# Patient Record
Sex: Female | Born: 1945 | Race: Black or African American | Hispanic: No | Smoking: Former smoker
Health system: Southern US, Community
[De-identification: ages and names within clinical notes are randomized; demographics above are authoritative.]

## PROBLEM LIST (undated history)

## (undated) DIAGNOSIS — E870 Hyperosmolality and hypernatremia: Secondary | ICD-10-CM

## (undated) DIAGNOSIS — I10 Essential (primary) hypertension: Secondary | ICD-10-CM

## (undated) DIAGNOSIS — D649 Anemia, unspecified: Secondary | ICD-10-CM

## (undated) DIAGNOSIS — I441 Atrioventricular block, second degree: Secondary | ICD-10-CM

## (undated) DIAGNOSIS — E876 Hypokalemia: Secondary | ICD-10-CM

## (undated) DIAGNOSIS — E785 Hyperlipidemia, unspecified: Secondary | ICD-10-CM

## (undated) DIAGNOSIS — I639 Cerebral infarction, unspecified: Secondary | ICD-10-CM

## (undated) DIAGNOSIS — M1990 Unspecified osteoarthritis, unspecified site: Secondary | ICD-10-CM

## (undated) DIAGNOSIS — J449 Chronic obstructive pulmonary disease, unspecified: Secondary | ICD-10-CM

## (undated) DIAGNOSIS — I6529 Occlusion and stenosis of unspecified carotid artery: Secondary | ICD-10-CM

## (undated) DIAGNOSIS — I2510 Atherosclerotic heart disease of native coronary artery without angina pectoris: Secondary | ICD-10-CM

## (undated) DIAGNOSIS — IMO0002 Reserved for concepts with insufficient information to code with codable children: Secondary | ICD-10-CM

## (undated) DIAGNOSIS — I442 Atrioventricular block, complete: Secondary | ICD-10-CM

## (undated) DIAGNOSIS — Z950 Presence of cardiac pacemaker: Secondary | ICD-10-CM

## (undated) HISTORY — DX: Atrioventricular block, complete: I44.2

## (undated) HISTORY — DX: Atrioventricular block, second degree: I44.1

## (undated) HISTORY — DX: Atherosclerotic heart disease of native coronary artery without angina pectoris: I25.10

## (undated) HISTORY — DX: Reserved for concepts with insufficient information to code with codable children: IMO0002

## (undated) HISTORY — DX: Presence of cardiac pacemaker: Z95.0

## (undated) HISTORY — DX: Hypokalemia: E87.6

## (undated) HISTORY — DX: Hyperosmolality and hypernatremia: E87.0

## (undated) HISTORY — DX: Hyperlipidemia, unspecified: E78.5

## (undated) HISTORY — DX: Anemia, unspecified: D64.9

## (undated) HISTORY — PX: ABOVE KNEE LEG AMPUTATION: SUR20

## (undated) HISTORY — DX: Unspecified osteoarthritis, unspecified site: M19.90

## (undated) HISTORY — PX: CAROTID ENDARTERECTOMY: SUR193

## (undated) HISTORY — DX: Occlusion and stenosis of unspecified carotid artery: I65.29

---

## 2001-01-01 DIAGNOSIS — R32 Unspecified urinary incontinence: Secondary | ICD-10-CM | POA: Insufficient documentation

## 2001-01-08 ENCOUNTER — Ambulatory Visit (HOSPITAL_COMMUNITY): Admission: RE | Admit: 2001-01-08 | Discharge: 2001-01-08 | Payer: Self-pay | Admitting: Family Medicine

## 2001-02-21 ENCOUNTER — Encounter: Payer: Self-pay | Admitting: Family Medicine

## 2001-02-21 ENCOUNTER — Ambulatory Visit (HOSPITAL_COMMUNITY): Admission: RE | Admit: 2001-02-21 | Discharge: 2001-02-21 | Payer: Self-pay | Admitting: Family Medicine

## 2001-04-10 ENCOUNTER — Inpatient Hospital Stay (HOSPITAL_COMMUNITY): Admission: EM | Admit: 2001-04-10 | Discharge: 2001-04-12 | Payer: Self-pay | Admitting: Emergency Medicine

## 2001-04-10 ENCOUNTER — Encounter: Payer: Self-pay | Admitting: *Deleted

## 2001-06-02 ENCOUNTER — Encounter: Payer: Self-pay | Admitting: Vascular Surgery

## 2001-06-02 ENCOUNTER — Encounter: Admission: RE | Admit: 2001-06-02 | Discharge: 2001-06-02 | Payer: Self-pay | Admitting: Vascular Surgery

## 2001-06-04 ENCOUNTER — Ambulatory Visit: Admission: RE | Admit: 2001-06-04 | Discharge: 2001-06-04 | Payer: Self-pay | Admitting: Vascular Surgery

## 2001-06-04 ENCOUNTER — Encounter: Payer: Self-pay | Admitting: Vascular Surgery

## 2001-06-16 ENCOUNTER — Inpatient Hospital Stay (HOSPITAL_COMMUNITY): Admission: RE | Admit: 2001-06-16 | Discharge: 2001-06-17 | Payer: Self-pay | Admitting: Vascular Surgery

## 2001-06-16 ENCOUNTER — Encounter (INDEPENDENT_AMBULATORY_CARE_PROVIDER_SITE_OTHER): Payer: Self-pay | Admitting: *Deleted

## 2001-06-26 DIAGNOSIS — I6529 Occlusion and stenosis of unspecified carotid artery: Secondary | ICD-10-CM

## 2002-02-09 ENCOUNTER — Ambulatory Visit (HOSPITAL_BASED_OUTPATIENT_CLINIC_OR_DEPARTMENT_OTHER): Admission: RE | Admit: 2002-02-09 | Discharge: 2002-02-09 | Payer: Self-pay | Admitting: Cardiology

## 2002-03-16 ENCOUNTER — Ambulatory Visit (HOSPITAL_COMMUNITY): Admission: RE | Admit: 2002-03-16 | Discharge: 2002-03-17 | Payer: Self-pay | Admitting: Cardiology

## 2002-03-16 HISTORY — PX: PERIPHERAL ARTERIAL STENT GRAFT: SHX2220

## 2002-04-10 HISTORY — PX: CARDIAC CATHETERIZATION: SHX172

## 2003-06-14 ENCOUNTER — Emergency Department (HOSPITAL_COMMUNITY): Admission: EM | Admit: 2003-06-14 | Discharge: 2003-06-14 | Payer: Self-pay | Admitting: Emergency Medicine

## 2005-01-01 ENCOUNTER — Ambulatory Visit: Payer: Self-pay | Admitting: Family Medicine

## 2005-01-09 ENCOUNTER — Ambulatory Visit: Payer: Self-pay | Admitting: Family Medicine

## 2005-01-23 ENCOUNTER — Ambulatory Visit: Payer: Self-pay | Admitting: Family Medicine

## 2005-02-13 ENCOUNTER — Ambulatory Visit: Payer: Self-pay | Admitting: Family Medicine

## 2005-06-27 ENCOUNTER — Ambulatory Visit: Payer: Self-pay | Admitting: Family Medicine

## 2005-08-23 ENCOUNTER — Ambulatory Visit: Payer: Self-pay | Admitting: Family Medicine

## 2006-01-31 ENCOUNTER — Encounter (INDEPENDENT_AMBULATORY_CARE_PROVIDER_SITE_OTHER): Payer: Self-pay | Admitting: Family Medicine

## 2006-01-31 ENCOUNTER — Ambulatory Visit: Payer: Self-pay | Admitting: Family Medicine

## 2006-05-07 ENCOUNTER — Ambulatory Visit: Payer: Self-pay | Admitting: Family Medicine

## 2006-09-10 ENCOUNTER — Ambulatory Visit: Payer: Self-pay | Admitting: Family Medicine

## 2006-09-19 ENCOUNTER — Ambulatory Visit (HOSPITAL_COMMUNITY): Admission: RE | Admit: 2006-09-19 | Discharge: 2006-09-19 | Payer: Self-pay | Admitting: Family Medicine

## 2006-10-08 ENCOUNTER — Encounter (INDEPENDENT_AMBULATORY_CARE_PROVIDER_SITE_OTHER): Payer: Self-pay | Admitting: Family Medicine

## 2006-10-08 DIAGNOSIS — E119 Type 2 diabetes mellitus without complications: Secondary | ICD-10-CM

## 2006-10-08 DIAGNOSIS — E1139 Type 2 diabetes mellitus with other diabetic ophthalmic complication: Secondary | ICD-10-CM

## 2006-10-08 DIAGNOSIS — E669 Obesity, unspecified: Secondary | ICD-10-CM | POA: Insufficient documentation

## 2006-10-08 DIAGNOSIS — Z8679 Personal history of other diseases of the circulatory system: Secondary | ICD-10-CM | POA: Insufficient documentation

## 2006-10-08 DIAGNOSIS — H409 Unspecified glaucoma: Secondary | ICD-10-CM | POA: Insufficient documentation

## 2006-10-08 DIAGNOSIS — E1129 Type 2 diabetes mellitus with other diabetic kidney complication: Secondary | ICD-10-CM

## 2006-10-08 DIAGNOSIS — F17200 Nicotine dependence, unspecified, uncomplicated: Secondary | ICD-10-CM | POA: Insufficient documentation

## 2006-10-08 DIAGNOSIS — E785 Hyperlipidemia, unspecified: Secondary | ICD-10-CM

## 2006-10-08 DIAGNOSIS — I10 Essential (primary) hypertension: Secondary | ICD-10-CM | POA: Insufficient documentation

## 2006-10-08 DIAGNOSIS — I739 Peripheral vascular disease, unspecified: Secondary | ICD-10-CM | POA: Insufficient documentation

## 2006-11-28 ENCOUNTER — Ambulatory Visit: Payer: Self-pay | Admitting: Family Medicine

## 2006-11-28 LAB — CONVERTED CEMR LAB
Albumin: 4.2 g/dL (ref 3.5–5.2)
CO2: 23 meq/L (ref 19–32)
Calcium: 8.5 mg/dL (ref 8.4–10.5)
Chloride: 105 meq/L (ref 96–112)
Cholesterol: 158 mg/dL (ref 0–200)
Glucose, Bld: 90 mg/dL (ref 70–99)
Potassium: 3.8 meq/L (ref 3.5–5.3)
Sodium: 142 meq/L (ref 135–145)
Total Bilirubin: 0.4 mg/dL (ref 0.3–1.2)
Total Protein: 7 g/dL (ref 6.0–8.3)
Triglycerides: 105 mg/dL (ref <150)

## 2007-01-21 ENCOUNTER — Telehealth (INDEPENDENT_AMBULATORY_CARE_PROVIDER_SITE_OTHER): Payer: Self-pay | Admitting: *Deleted

## 2007-01-24 ENCOUNTER — Telehealth (INDEPENDENT_AMBULATORY_CARE_PROVIDER_SITE_OTHER): Payer: Self-pay | Admitting: Family Medicine

## 2007-02-11 ENCOUNTER — Ambulatory Visit: Payer: Self-pay | Admitting: Family Medicine

## 2007-02-11 DIAGNOSIS — G8929 Other chronic pain: Secondary | ICD-10-CM

## 2007-03-18 ENCOUNTER — Telehealth (INDEPENDENT_AMBULATORY_CARE_PROVIDER_SITE_OTHER): Payer: Self-pay | Admitting: Family Medicine

## 2007-04-23 ENCOUNTER — Telehealth (INDEPENDENT_AMBULATORY_CARE_PROVIDER_SITE_OTHER): Payer: Self-pay | Admitting: Family Medicine

## 2007-07-01 ENCOUNTER — Ambulatory Visit: Payer: Self-pay | Admitting: Family Medicine

## 2007-07-01 LAB — CONVERTED CEMR LAB
Albumin: 3.8 g/dL (ref 3.5–5.2)
Alkaline Phosphatase: 99 units/L (ref 39–117)
BUN: 16 mg/dL (ref 6–23)
CO2: 23 meq/L (ref 19–32)
Cholesterol: 139 mg/dL (ref 0–200)
Eosinophils Absolute: 0.1 10*3/uL (ref 0.0–0.7)
Eosinophils Relative: 1 % (ref 0–5)
Glucose, Bld: 102 mg/dL — ABNORMAL HIGH (ref 70–99)
HCT: 38.6 % (ref 36.0–46.0)
HDL: 37 mg/dL — ABNORMAL LOW (ref >39)
Hgb A1c MFr Bld: 6.4 %
LDL Cholesterol: 82 mg/dL (ref 0–99)
Lymphocytes Relative: 38 % (ref 12–46)
Lymphs Abs: 3 10*3/uL (ref 0.7–4.0)
MCV: 94.6 fL (ref 78.0–100.0)
Monocytes Relative: 6 % (ref 3–12)
Potassium: 3.9 meq/L (ref 3.5–5.3)
RBC: 4.08 M/uL (ref 3.87–5.11)
Total Bilirubin: 0.3 mg/dL (ref 0.3–1.2)
Total Protein: 6.6 g/dL (ref 6.0–8.3)
Triglycerides: 99 mg/dL (ref <150)
WBC: 7.9 10*3/uL (ref 4.0–10.5)

## 2007-10-06 ENCOUNTER — Telehealth (INDEPENDENT_AMBULATORY_CARE_PROVIDER_SITE_OTHER): Payer: Self-pay | Admitting: Family Medicine

## 2007-11-24 ENCOUNTER — Telehealth (INDEPENDENT_AMBULATORY_CARE_PROVIDER_SITE_OTHER): Payer: Self-pay | Admitting: Family Medicine

## 2007-12-29 ENCOUNTER — Ambulatory Visit: Payer: Self-pay | Admitting: Family Medicine

## 2007-12-29 LAB — CONVERTED CEMR LAB
Alkaline Phosphatase: 122 units/L — ABNORMAL HIGH (ref 39–117)
BUN: 11 mg/dL (ref 6–23)
CO2: 23 meq/L (ref 19–32)
Cholesterol: 160 mg/dL (ref 0–200)
Creatinine, Ser: 0.8 mg/dL (ref 0.40–1.20)
Eosinophils Absolute: 0.1 10*3/uL (ref 0.0–0.7)
Eosinophils Relative: 1 % (ref 0–5)
Glucose, Bld: 96 mg/dL (ref 70–99)
HCT: 42.2 % (ref 36.0–46.0)
HDL: 43 mg/dL (ref >39)
Hemoglobin: 13.2 g/dL (ref 12.0–15.0)
Hgb A1c MFr Bld: 6.3 %
LDL Cholesterol: 92 mg/dL (ref 0–99)
Lymphocytes Relative: 31 % (ref 12–46)
Lymphs Abs: 2.8 10*3/uL (ref 0.7–4.0)
MCV: 96.1 fL (ref 78.0–100.0)
Monocytes Absolute: 0.6 10*3/uL (ref 0.1–1.0)
Monocytes Relative: 7 % (ref 3–12)
Platelets: 295 10*3/uL (ref 150–400)
Total Bilirubin: 0.6 mg/dL (ref 0.3–1.2)
Total Protein: 6.9 g/dL (ref 6.0–8.3)
Triglycerides: 125 mg/dL (ref <150)
VLDL: 25 mg/dL (ref 0–40)
WBC: 9 10*3/uL (ref 4.0–10.5)

## 2008-02-16 ENCOUNTER — Telehealth (INDEPENDENT_AMBULATORY_CARE_PROVIDER_SITE_OTHER): Payer: Self-pay | Admitting: Family Medicine

## 2008-06-09 ENCOUNTER — Ambulatory Visit: Payer: Self-pay | Admitting: Family Medicine

## 2008-06-09 LAB — CONVERTED CEMR LAB
Alkaline Phosphatase: 101 units/L (ref 39–117)
Basophils Absolute: 0 10*3/uL (ref 0.0–0.1)
CO2: 20 meq/L (ref 19–32)
Creatinine, Ser: 0.73 mg/dL (ref 0.40–1.20)
Eosinophils Absolute: 0.2 10*3/uL (ref 0.0–0.7)
Eosinophils Relative: 3 % (ref 0–5)
Glucose, Bld: 108 mg/dL — ABNORMAL HIGH (ref 70–99)
HCT: 39.8 % (ref 36.0–46.0)
Hemoglobin: 13 g/dL (ref 12.0–15.0)
Hgb A1c MFr Bld: 6.5 %
Lymphocytes Relative: 36 % (ref 12–46)
Lymphs Abs: 3 10*3/uL (ref 0.7–4.0)
MCV: 92.3 fL (ref 78.0–100.0)
Monocytes Absolute: 0.6 10*3/uL (ref 0.1–1.0)
RDW: 13.9 % (ref 11.5–15.5)
TSH: 2.221 microintl units/mL (ref 0.350–4.50)
Total Bilirubin: 0.3 mg/dL (ref 0.3–1.2)

## 2008-06-28 ENCOUNTER — Encounter (INDEPENDENT_AMBULATORY_CARE_PROVIDER_SITE_OTHER): Payer: Self-pay | Admitting: Family Medicine

## 2008-11-29 ENCOUNTER — Telehealth (INDEPENDENT_AMBULATORY_CARE_PROVIDER_SITE_OTHER): Payer: Self-pay | Admitting: Internal Medicine

## 2008-12-14 ENCOUNTER — Ambulatory Visit: Payer: Self-pay | Admitting: Physician Assistant

## 2008-12-14 DIAGNOSIS — E049 Nontoxic goiter, unspecified: Secondary | ICD-10-CM | POA: Insufficient documentation

## 2008-12-14 DIAGNOSIS — R609 Edema, unspecified: Secondary | ICD-10-CM

## 2008-12-14 LAB — CONVERTED CEMR LAB: Blood Glucose, AC Bkfst: 101 mg/dL

## 2008-12-15 ENCOUNTER — Encounter (INDEPENDENT_AMBULATORY_CARE_PROVIDER_SITE_OTHER): Payer: Self-pay | Admitting: Internal Medicine

## 2008-12-15 ENCOUNTER — Ambulatory Visit: Payer: Self-pay | Admitting: Vascular Surgery

## 2008-12-15 ENCOUNTER — Encounter: Payer: Self-pay | Admitting: Physician Assistant

## 2008-12-15 ENCOUNTER — Ambulatory Visit (HOSPITAL_COMMUNITY): Admission: RE | Admit: 2008-12-15 | Discharge: 2008-12-15 | Payer: Self-pay | Admitting: Internal Medicine

## 2008-12-21 ENCOUNTER — Telehealth: Payer: Self-pay | Admitting: Physician Assistant

## 2008-12-21 ENCOUNTER — Ambulatory Visit (HOSPITAL_COMMUNITY): Admission: RE | Admit: 2008-12-21 | Discharge: 2008-12-21 | Payer: Self-pay | Admitting: Internal Medicine

## 2008-12-27 ENCOUNTER — Encounter: Payer: Self-pay | Admitting: Physician Assistant

## 2009-01-06 ENCOUNTER — Ambulatory Visit: Payer: Self-pay | Admitting: Physician Assistant

## 2009-01-07 LAB — CONVERTED CEMR LAB
Albumin: 3.5 g/dL (ref 3.5–5.2)
Alkaline Phosphatase: 120 units/L — ABNORMAL HIGH (ref 39–117)
CO2: 22 meq/L (ref 19–32)
Calcium: 8.4 mg/dL (ref 8.4–10.5)
Chloride: 105 meq/L (ref 96–112)
Cholesterol: 158 mg/dL (ref 0–200)
Glucose, Bld: 105 mg/dL — ABNORMAL HIGH (ref 70–99)
Hemoglobin: 12.8 g/dL (ref 12.0–15.0)
LDL Cholesterol: 101 mg/dL — ABNORMAL HIGH (ref 0–99)
Lymphocytes Relative: 28 % (ref 12–46)
Lymphs Abs: 2.8 10*3/uL (ref 0.7–4.0)
Monocytes Absolute: 0.7 10*3/uL (ref 0.1–1.0)
Monocytes Relative: 7 % (ref 3–12)
Neutro Abs: 6.3 10*3/uL (ref 1.7–7.7)
Potassium: 3.6 meq/L (ref 3.5–5.3)
RBC: 4.21 M/uL (ref 3.87–5.11)
Sodium: 140 meq/L (ref 135–145)
Total Protein: 6.4 g/dL (ref 6.0–8.3)
Triglycerides: 84 mg/dL (ref <150)

## 2009-01-10 ENCOUNTER — Telehealth: Payer: Self-pay | Admitting: Physician Assistant

## 2009-01-10 ENCOUNTER — Ambulatory Visit: Payer: Self-pay | Admitting: Physician Assistant

## 2009-01-10 DIAGNOSIS — R82998 Other abnormal findings in urine: Secondary | ICD-10-CM

## 2009-01-10 LAB — CONVERTED CEMR LAB
Glucose, Urine, Semiquant: NEGATIVE
Hgb A1c MFr Bld: 6.8 %
Specific Gravity, Urine: >=1.03
WBC Urine, dipstick: NEGATIVE

## 2009-01-11 ENCOUNTER — Encounter: Payer: Self-pay | Admitting: Physician Assistant

## 2009-01-12 ENCOUNTER — Encounter: Admission: RE | Admit: 2009-01-12 | Discharge: 2009-01-12 | Payer: Self-pay | Admitting: Internal Medicine

## 2009-01-12 ENCOUNTER — Encounter: Payer: Self-pay | Admitting: Physician Assistant

## 2009-01-12 ENCOUNTER — Encounter (INDEPENDENT_AMBULATORY_CARE_PROVIDER_SITE_OTHER): Payer: Self-pay | Admitting: Interventional Radiology

## 2009-01-12 ENCOUNTER — Other Ambulatory Visit: Admission: RE | Admit: 2009-01-12 | Discharge: 2009-01-12 | Payer: Self-pay | Admitting: Interventional Radiology

## 2009-01-14 ENCOUNTER — Telehealth: Payer: Self-pay | Admitting: Physician Assistant

## 2009-01-14 ENCOUNTER — Encounter: Payer: Self-pay | Admitting: Physician Assistant

## 2009-01-14 DIAGNOSIS — E042 Nontoxic multinodular goiter: Secondary | ICD-10-CM

## 2009-01-18 ENCOUNTER — Ambulatory Visit: Payer: Self-pay | Admitting: Surgery

## 2009-01-18 ENCOUNTER — Telehealth: Payer: Self-pay | Admitting: Physician Assistant

## 2009-01-18 ENCOUNTER — Encounter (INDEPENDENT_AMBULATORY_CARE_PROVIDER_SITE_OTHER): Payer: Self-pay | Admitting: Internal Medicine

## 2009-01-18 ENCOUNTER — Ambulatory Visit (HOSPITAL_COMMUNITY): Admission: RE | Admit: 2009-01-18 | Discharge: 2009-01-18 | Payer: Self-pay | Admitting: Internal Medicine

## 2009-01-28 ENCOUNTER — Encounter: Payer: Self-pay | Admitting: Physician Assistant

## 2009-02-09 ENCOUNTER — Ambulatory Visit: Payer: Self-pay | Admitting: Physician Assistant

## 2009-02-09 ENCOUNTER — Telehealth: Payer: Self-pay | Admitting: Physician Assistant

## 2009-02-09 LAB — CONVERTED CEMR LAB
Bilirubin Urine: NEGATIVE
Glucose, Urine, Semiquant: NEGATIVE
Protein, U semiquant: >=300
pH: 6

## 2009-02-21 ENCOUNTER — Ambulatory Visit: Payer: Self-pay | Admitting: Surgery

## 2009-02-22 ENCOUNTER — Inpatient Hospital Stay (HOSPITAL_COMMUNITY): Admission: RE | Admit: 2009-02-22 | Discharge: 2009-02-23 | Payer: Self-pay | Admitting: Surgery

## 2009-02-22 ENCOUNTER — Ambulatory Visit: Payer: Self-pay | Admitting: Surgery

## 2009-02-22 HISTORY — PX: CAROTID ARTERY ANGIOPLASTY: SHX1300

## 2009-03-04 ENCOUNTER — Telehealth: Payer: Self-pay | Admitting: Physician Assistant

## 2009-03-21 ENCOUNTER — Ambulatory Visit: Payer: Self-pay | Admitting: Surgery

## 2009-04-07 ENCOUNTER — Encounter: Payer: Self-pay | Admitting: Physician Assistant

## 2009-04-07 HISTORY — PX: NM MYOCAR PERF WALL MOTION: HXRAD629

## 2009-05-09 ENCOUNTER — Telehealth: Payer: Self-pay | Admitting: Physician Assistant

## 2009-05-24 ENCOUNTER — Telehealth: Payer: Self-pay | Admitting: Physician Assistant

## 2009-05-24 ENCOUNTER — Ambulatory Visit: Payer: Self-pay | Admitting: Physician Assistant

## 2009-05-24 LAB — CONVERTED CEMR LAB: Blood Glucose, AC Bkfst: 119 mg/dL

## 2009-05-25 ENCOUNTER — Encounter (INDEPENDENT_AMBULATORY_CARE_PROVIDER_SITE_OTHER): Payer: Self-pay | Admitting: *Deleted

## 2009-05-25 LAB — CONVERTED CEMR LAB
ALT: 8 units/L (ref 0–35)
AST: 14 units/L (ref 0–37)
Albumin: 4 g/dL (ref 3.5–5.2)
Calcium: 9 mg/dL (ref 8.4–10.5)
Chloride: 106 meq/L (ref 96–112)
Hgb A1c MFr Bld: 6.7 % — ABNORMAL HIGH (ref 4.6–6.1)
Potassium: 3.7 meq/L (ref 3.5–5.3)
Total Protein: 6.6 g/dL (ref 6.0–8.3)

## 2009-06-08 ENCOUNTER — Encounter: Payer: Self-pay | Admitting: Physician Assistant

## 2009-06-08 ENCOUNTER — Ambulatory Visit (HOSPITAL_COMMUNITY): Admission: RE | Admit: 2009-06-08 | Discharge: 2009-06-08 | Payer: Self-pay | Admitting: Internal Medicine

## 2009-06-14 ENCOUNTER — Encounter: Payer: Self-pay | Admitting: Physician Assistant

## 2009-06-17 ENCOUNTER — Encounter: Payer: Self-pay | Admitting: Physician Assistant

## 2010-01-02 ENCOUNTER — Telehealth: Payer: Self-pay | Admitting: Physician Assistant

## 2010-01-04 ENCOUNTER — Telehealth: Payer: Self-pay | Admitting: Physician Assistant

## 2010-02-23 ENCOUNTER — Ambulatory Visit: Payer: Self-pay | Admitting: Nurse Practitioner

## 2010-02-23 LAB — CONVERTED CEMR LAB
Bilirubin Urine: NEGATIVE
Blood Glucose, Fingerstick: 103
Cholesterol, target level: 200 mg/dL
Hgb A1c MFr Bld: 6 %
Ketones, urine, test strip: NEGATIVE
Specific Gravity, Urine: 1.025

## 2010-02-24 LAB — CONVERTED CEMR LAB
AST: 15 units/L (ref 0–37)
Albumin: 3.4 g/dL — ABNORMAL LOW (ref 3.5–5.2)
Alkaline Phosphatase: 121 units/L — ABNORMAL HIGH (ref 39–117)
BUN: 9 mg/dL (ref 6–23)
Basophils Relative: 0 % (ref 0–1)
Eosinophils Absolute: 0.1 10*3/uL (ref 0.0–0.7)
Eosinophils Relative: 1 % (ref 0–5)
HCT: 39.7 % (ref 36.0–46.0)
MCHC: 31.7 g/dL (ref 30.0–36.0)
MCV: 89.2 fL (ref 78.0–100.0)
Microalb, Ur: 460 mg/dL — ABNORMAL HIGH (ref 0.00–1.89)
Monocytes Relative: 5 % (ref 3–12)
Neutrophils Relative %: 66 % (ref 43–77)
Platelets: 426 10*3/uL — ABNORMAL HIGH (ref 150–400)
Potassium: 4.1 meq/L (ref 3.5–5.3)
RBC: 4.45 M/uL (ref 3.87–5.11)
Total Bilirubin: 0.2 mg/dL — ABNORMAL LOW (ref 0.3–1.2)

## 2010-03-16 ENCOUNTER — Ambulatory Visit: Payer: Self-pay | Admitting: Nurse Practitioner

## 2010-04-28 ENCOUNTER — Observation Stay (HOSPITAL_COMMUNITY)
Admission: EM | Admit: 2010-04-28 | Discharge: 2010-04-30 | Payer: Self-pay | Source: Home / Self Care | Attending: Cardiovascular Disease | Admitting: Cardiovascular Disease

## 2010-05-01 LAB — URINE MICROSCOPIC-ADD ON

## 2010-05-01 LAB — CK TOTAL AND CKMB (NOT AT ARMC)
CK, MB: 0.9 ng/mL (ref 0.3–4.0)
Relative Index: INVALID (ref 0.0–2.5)
Total CK: 39 U/L (ref 7–177)

## 2010-05-01 LAB — CBC
HCT: 36.7 % (ref 36.0–46.0)
HCT: 40.5 % (ref 36.0–46.0)
Hemoglobin: 11.9 g/dL — ABNORMAL LOW (ref 12.0–15.0)
Hemoglobin: 13.5 g/dL (ref 12.0–15.0)
MCH: 27.9 pg (ref 26.0–34.0)
MCH: 28.7 pg (ref 26.0–34.0)
MCHC: 32.4 g/dL (ref 30.0–36.0)
MCHC: 33.3 g/dL (ref 30.0–36.0)
MCV: 86.2 fL (ref 78.0–100.0)
MCV: 86 fL (ref 78.0–100.0)
Platelets: 280 10*3/uL (ref 150–400)
Platelets: 324 10*3/uL (ref 150–400)
RBC: 4.26 MIL/uL (ref 3.87–5.11)
RBC: 4.71 MIL/uL (ref 3.87–5.11)
RDW: 14.9 % (ref 11.5–15.5)
RDW: 14.7 % (ref 11.5–15.5)
WBC: 9.1 10*3/uL (ref 4.0–10.5)
WBC: 12.3 10*3/uL — ABNORMAL HIGH (ref 4.0–10.5)

## 2010-05-01 LAB — BASIC METABOLIC PANEL
BUN: 11 mg/dL (ref 6–23)
CO2: 25 mEq/L (ref 19–32)
Calcium: 9 mg/dL (ref 8.4–10.5)
Chloride: 108 mEq/L (ref 96–112)
Creatinine, Ser: 0.8 mg/dL (ref 0.4–1.2)
GFR calc Af Amer: >60 mL/min (ref >60)
GFR calc non Af Amer: >60 mL/min (ref >60)
Glucose, Bld: 120 mg/dL — ABNORMAL HIGH (ref 70–99)
Potassium: 3.5 mEq/L (ref 3.5–5.1)
Sodium: 139 mEq/L (ref 135–145)

## 2010-05-01 LAB — COMPREHENSIVE METABOLIC PANEL
ALT: 13 U/L (ref 0–35)
AST: 16 U/L (ref 0–37)
Albumin: 2.5 g/dL — ABNORMAL LOW (ref 3.5–5.2)
Alkaline Phosphatase: 82 U/L (ref 39–117)
BUN: 15 mg/dL (ref 6–23)
CO2: 24 mEq/L (ref 19–32)
Calcium: 8.8 mg/dL (ref 8.4–10.5)
Chloride: 108 mEq/L (ref 96–112)
Creatinine, Ser: 0.8 mg/dL (ref 0.4–1.2)
GFR calc Af Amer: >60 mL/min (ref >60)
GFR calc non Af Amer: >60 mL/min (ref >60)
Glucose, Bld: 153 mg/dL — ABNORMAL HIGH (ref 70–99)
Potassium: 3.3 mEq/L — ABNORMAL LOW (ref 3.5–5.1)
Sodium: 139 mEq/L (ref 135–145)
Total Bilirubin: 0.4 mg/dL (ref 0.3–1.2)
Total Protein: 6.2 g/dL (ref 6.0–8.3)

## 2010-05-01 LAB — GLUCOSE, CAPILLARY
Glucose-Capillary: 98 mg/dL (ref 70–99)
Glucose-Capillary: 111 mg/dL — ABNORMAL HIGH (ref 70–99)
Glucose-Capillary: 100 mg/dL — ABNORMAL HIGH (ref 70–99)
Glucose-Capillary: 111 mg/dL — ABNORMAL HIGH (ref 70–99)
Glucose-Capillary: 123 mg/dL — ABNORMAL HIGH (ref 70–99)
Glucose-Capillary: 128 mg/dL — ABNORMAL HIGH (ref 70–99)
Glucose-Capillary: 120 mg/dL — ABNORMAL HIGH (ref 70–99)
Glucose-Capillary: 126 mg/dL — ABNORMAL HIGH (ref 70–99)

## 2010-05-01 LAB — POCT I-STAT, CHEM 8
BUN: 20 mg/dL (ref 6–23)
Calcium, Ion: 1.1 mmol/L — ABNORMAL LOW (ref 1.12–1.32)
Chloride: 108 mEq/L (ref 96–112)
Creatinine, Ser: 1.1 mg/dL (ref 0.4–1.2)
Glucose, Bld: 97 mg/dL (ref 70–99)
HCT: 42 % (ref 36.0–46.0)
Hemoglobin: 14.3 g/dL (ref 12.0–15.0)
Potassium: 3.7 mEq/L (ref 3.5–5.1)
Sodium: 139 mEq/L (ref 135–145)
TCO2: 25 mmol/L (ref 0–100)

## 2010-05-01 LAB — DIFFERENTIAL
Basophils Absolute: 0 10*3/uL (ref 0.0–0.1)
Basophils Relative: 0 % (ref 0–1)
Eosinophils Absolute: 0.1 10*3/uL (ref 0.0–0.7)
Eosinophils Relative: 1 % (ref 0–5)
Lymphocytes Relative: 23 % (ref 12–46)
Lymphs Abs: 2.1 10*3/uL (ref 0.7–4.0)
Monocytes Absolute: 0.5 10*3/uL (ref 0.1–1.0)
Monocytes Relative: 5 % (ref 3–12)
Neutro Abs: 6.3 10*3/uL (ref 1.7–7.7)
Neutrophils Relative %: 70 % (ref 43–77)

## 2010-05-01 LAB — TSH: TSH: 1.644 u[IU]/mL (ref 0.350–4.500)

## 2010-05-01 LAB — PROTIME-INR
INR: 0.99 (ref 0.00–1.49)
Prothrombin Time: 13.3 seconds (ref 11.6–15.2)

## 2010-05-01 LAB — URINALYSIS, ROUTINE W REFLEX MICROSCOPIC
Bilirubin Urine: NEGATIVE
Hgb urine dipstick: NEGATIVE
Ketones, ur: NEGATIVE mg/dL
Leukocytes, UA: NEGATIVE
Nitrite: NEGATIVE
Protein, ur: >300 mg/dL — AB
Specific Gravity, Urine: 1.011 (ref 1.005–1.030)
Urine Glucose, Fasting: NEGATIVE mg/dL
Urobilinogen, UA: 1 mg/dL (ref 0.0–1.0)
pH: 6 (ref 5.0–8.0)

## 2010-05-01 LAB — CARDIAC PANEL(CRET KIN+CKTOT+MB+TROPI)
CK, MB: 1 ng/mL (ref 0.3–4.0)
CK, MB: 1.6 ng/mL (ref 0.3–4.0)
Relative Index: INVALID (ref 0.0–2.5)
Relative Index: INVALID (ref 0.0–2.5)
Total CK: 43 U/L (ref 7–177)
Total CK: 76 U/L (ref 7–177)
Troponin I: 0.03 ng/mL (ref 0.00–0.06)
Troponin I: 0.01 ng/mL (ref 0.00–0.06)

## 2010-05-01 LAB — BRAIN NATRIURETIC PEPTIDE: Pro B Natriuretic peptide (BNP): 33 pg/mL (ref 0.0–100.0)

## 2010-05-01 LAB — HEMOGLOBIN A1C
Hgb A1c MFr Bld: 6.2 % — ABNORMAL HIGH (ref <5.7)
Mean Plasma Glucose: 131 mg/dL — ABNORMAL HIGH (ref <117)

## 2010-05-01 LAB — MRSA PCR SCREENING: MRSA by PCR: NEGATIVE

## 2010-05-01 LAB — TROPONIN I: Troponin I: 0.02 ng/mL (ref 0.00–0.06)

## 2010-05-01 LAB — MAGNESIUM: Magnesium: 2 mg/dL (ref 1.5–2.5)

## 2010-05-01 LAB — APTT: aPTT: 32 seconds (ref 24–37)

## 2010-05-02 ENCOUNTER — Inpatient Hospital Stay (HOSPITAL_COMMUNITY)
Admission: EM | Admit: 2010-05-02 | Discharge: 2010-05-08 | Payer: Self-pay | Source: Home / Self Care | Attending: Internal Medicine | Admitting: Internal Medicine

## 2010-05-03 ENCOUNTER — Encounter (INDEPENDENT_AMBULATORY_CARE_PROVIDER_SITE_OTHER): Payer: Self-pay | Admitting: Internal Medicine

## 2010-05-03 LAB — COMPREHENSIVE METABOLIC PANEL
ALT: 15 U/L (ref 0–35)
AST: 19 U/L (ref 0–37)
Albumin: 2.8 g/dL — ABNORMAL LOW (ref 3.5–5.2)
Alkaline Phosphatase: 83 U/L (ref 39–117)
BUN: 12 mg/dL (ref 6–23)
CO2: 25 mEq/L (ref 19–32)
Calcium: 9.2 mg/dL (ref 8.4–10.5)
Chloride: 106 mEq/L (ref 96–112)
Creatinine, Ser: 0.81 mg/dL (ref 0.4–1.2)
GFR calc Af Amer: >60 mL/min (ref >60)
GFR calc non Af Amer: >60 mL/min (ref >60)
Glucose, Bld: 93 mg/dL (ref 70–99)
Potassium: 4.1 mEq/L (ref 3.5–5.1)
Sodium: 140 mEq/L (ref 135–145)
Total Bilirubin: 0.5 mg/dL (ref 0.3–1.2)
Total Protein: 6.7 g/dL (ref 6.0–8.3)

## 2010-05-03 LAB — CBC
HCT: 37.6 % (ref 36.0–46.0)
Hemoglobin: 12 g/dL (ref 12.0–15.0)
MCH: 27.6 pg (ref 26.0–34.0)
MCHC: 31.9 g/dL (ref 30.0–36.0)
MCV: 86.6 fL (ref 78.0–100.0)
Platelets: 288 10*3/uL (ref 150–400)
RBC: 4.34 MIL/uL (ref 3.87–5.11)
RDW: 14.9 % (ref 11.5–15.5)
WBC: 10.1 10*3/uL (ref 4.0–10.5)

## 2010-05-03 LAB — URINE MICROSCOPIC-ADD ON

## 2010-05-03 LAB — DIFFERENTIAL
Basophils Absolute: 0 10*3/uL (ref 0.0–0.1)
Basophils Relative: 0 % (ref 0–1)
Eosinophils Absolute: 0 10*3/uL (ref 0.0–0.7)
Eosinophils Relative: 0 % (ref 0–5)
Lymphocytes Relative: 22 % (ref 12–46)
Lymphs Abs: 2.2 10*3/uL (ref 0.7–4.0)
Monocytes Absolute: 0.8 10*3/uL (ref 0.1–1.0)
Monocytes Relative: 8 % (ref 3–12)
Neutro Abs: 7 10*3/uL (ref 1.7–7.7)
Neutrophils Relative %: 69 % (ref 43–77)

## 2010-05-03 LAB — URINALYSIS, ROUTINE W REFLEX MICROSCOPIC
Hgb urine dipstick: NEGATIVE
Ketones, ur: 15 mg/dL — AB
Leukocytes, UA: NEGATIVE
Nitrite: NEGATIVE
Protein, ur: >300 mg/dL — AB
Specific Gravity, Urine: 1.023 (ref 1.005–1.030)
Urine Glucose, Fasting: NEGATIVE mg/dL
Urobilinogen, UA: 1 mg/dL (ref 0.0–1.0)
pH: 6 (ref 5.0–8.0)

## 2010-05-03 LAB — CK TOTAL AND CKMB (NOT AT ARMC)
CK, MB: 2 ng/mL (ref 0.3–4.0)
Relative Index: INVALID (ref 0.0–2.5)
Total CK: 90 U/L (ref 7–177)

## 2010-05-03 LAB — GLUCOSE, CAPILLARY: Glucose-Capillary: 92 mg/dL (ref 70–99)

## 2010-05-03 LAB — BRAIN NATRIURETIC PEPTIDE: Pro B Natriuretic peptide (BNP): <30 pg/mL (ref 0.0–100.0)

## 2010-05-03 LAB — TROPONIN I: Troponin I: 0.03 ng/mL (ref 0.00–0.06)

## 2010-05-03 LAB — PHOSPHORUS: Phosphorus: 4.2 mg/dL (ref 2.3–4.6)

## 2010-05-03 LAB — MAGNESIUM: Magnesium: 2.1 mg/dL (ref 1.5–2.5)

## 2010-05-08 LAB — CARDIAC PANEL(CRET KIN+CKTOT+MB+TROPI)
CK, MB: 2 ng/mL (ref 0.3–4.0)
Relative Index: INVALID (ref 0.0–2.5)
Relative Index: INVALID (ref 0.0–2.5)
Total CK: 81 U/L (ref 7–177)
Total CK: 83 U/L (ref 7–177)
Troponin I: 0.03 ng/mL (ref 0.00–0.06)

## 2010-05-08 LAB — CBC
HCT: 35.1 % — ABNORMAL LOW (ref 36.0–46.0)
Hemoglobin: 11.5 g/dL — ABNORMAL LOW (ref 12.0–15.0)
Hemoglobin: 12 g/dL (ref 12.0–15.0)
MCH: 28 pg (ref 26.0–34.0)
MCHC: 32.8 g/dL (ref 30.0–36.0)
MCHC: 32.4 g/dL (ref 30.0–36.0)
MCV: 85.6 fL (ref 78.0–100.0)
Platelets: 270 10*3/uL (ref 150–400)
RBC: 4.1 MIL/uL (ref 3.87–5.11)
RBC: 4.27 MIL/uL (ref 3.87–5.11)
RDW: 14.9 % (ref 11.5–15.5)
WBC: 10 10*3/uL (ref 4.0–10.5)

## 2010-05-08 LAB — LIPID PANEL
Cholesterol: 123 mg/dL (ref 0–200)
LDL Cholesterol: 67 mg/dL (ref 0–99)
VLDL: 15 mg/dL (ref 0–40)

## 2010-05-08 LAB — COMPREHENSIVE METABOLIC PANEL
Albumin: 2.6 g/dL — ABNORMAL LOW (ref 3.5–5.2)
BUN: 7 mg/dL (ref 6–23)
Creatinine, Ser: 0.63 mg/dL (ref 0.4–1.2)
Total Protein: 6.1 g/dL (ref 6.0–8.3)

## 2010-05-08 LAB — APTT: aPTT: 37 seconds (ref 24–37)

## 2010-05-08 LAB — BASIC METABOLIC PANEL
CO2: 24 mEq/L (ref 19–32)
Calcium: 8.7 mg/dL (ref 8.4–10.5)
GFR calc Af Amer: >60 mL/min (ref >60)
Sodium: 138 mEq/L (ref 135–145)

## 2010-05-08 LAB — HEMOGLOBIN A1C
Hgb A1c MFr Bld: 6.1 % — ABNORMAL HIGH (ref <5.7)
Mean Plasma Glucose: 128 mg/dL — ABNORMAL HIGH (ref <117)

## 2010-05-08 LAB — PROTIME-INR
INR: 1.05 (ref 0.00–1.49)
Prothrombin Time: 13.9 seconds (ref 11.6–15.2)

## 2010-05-08 LAB — GLUCOSE, CAPILLARY: Glucose-Capillary: 154 mg/dL — ABNORMAL HIGH (ref 70–99)

## 2010-05-08 LAB — AMMONIA: Ammonia: 27 umol/L (ref 11–35)

## 2010-05-08 NOTE — Discharge Summary (Addendum)
  [name], [name]              ACCOUNT NO.:  [id number]  MEDICAL RECORD NO.:  [id number]          PATIENT TYPE:  INP  LOCATION:  3030                         FACILITY:  [facility]  PHYSICIAN:  Tarry Kos, MD       DATE OF BIRTH:  [birth year]  DATE OF ADMISSION:  05/02/2010 DATE OF DISCHARGE:                              DISCHARGE SUMMARY   DISCHARGE DIAGNOSES: 1. Altered mental status, resolved. 2. Transient ischemic attack. 3. Hypertension, uncontrolled. 4. Deconditioning.  HOSPITAL COURSE:  [name] is a pleasant 66 year old female who presented to the emergency room because of confusion.  This is her second admission in a less than a 2-week period.  She was admitted, had serial cardiac enzymes which were all negative.  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  She had a 2-D echo which showed no cardiac source of emboli.  EF 56%, grade 1 diastolic dysfunction.  [name]'s symptoms totally resolved.  She did not have any focal neurologic deficits.  Her blood pressure medications were initially held because it was concerned that hydralazine was making her confused.  These have been reinstituted while she is here and her blood pressure is much better controlled.  The patient is being discharged to short-term rehab with the ultimate goal to go home is safe.  She is to follow up with her primary care physician in 1-2 weeks and neurology in 2-4 weeks.  She is being discharged on aspirin and Plavix.  PHYSICAL EXAMINATION:  VITAL SIGNS:  She is afebrile.  Stable. GENERAL:  Alert and oriented, in no apparent distress conversantly. CARDIAC:  Regular rate and rhythm without murmur, rubs, or gallops. CHEST:  Clear to auscultation bilaterally.  No wheeze, rales, or rub. ABDOMEN:  Soft, nontender, nondistended.  Positive bowel sounds.  No hepatosplenomegaly. EXTREMITIES:  No clubbing,  cyanosis or edema. PSYCH:  Normal affect. NEUROLOGIC:  No focal neurologic deficits.  Cranial nerves II through XII grossly intact. SKIN:  No rashes.  DISCHARGE MEDICATIONS:  She is being discharged on the following medications. 1. Enalapril 10 mg twice a day. 2. Desipramine 25 mg at bedtime. 3. Flonase nasal spray 2 sprays each nostril a day. 4. Amlodipine 10 mg a day. 5. Hydralazine 50 mg four times a day. 6. Ditropan 5 mg daily. 7. Crestor 20 mg daily. 8. Lasix 40 mg daily. 9. Aspirin 81 mg daily. 10.Plavix 75 mg daily.  DISPOSITION:  The patient is being discharged for aggressive physical therapy.          ______________________________ Tarry Kos, MD     RD/MEDQ  D:  05/08/2010  T:  05/08/2010  Job:  [id number]  Electronically Signed by Eldridge Dace MD on 05/08/2010 02:10:37 PM

## 2010-05-09 LAB — GLUCOSE, CAPILLARY
Glucose-Capillary: 102 mg/dL — ABNORMAL HIGH (ref 70–99)
Glucose-Capillary: 101 mg/dL — ABNORMAL HIGH (ref 70–99)

## 2010-05-16 NOTE — Letter (Signed)
Summary: CARDIOLOGY NOTES  CARDIOLOGY NOTES   Imported By: Arta Bruce 07/19/2009 12:33:13  _____________________________________________________________________  External Attachment:    Type:   Image     Comment:   External Document

## 2010-05-16 NOTE — Progress Notes (Signed)
Summary: Vicodin Refill Denied  Phone Note Outgoing Call   Summary of Call: Rec'd request refill for vicodin. See Rx refill 7.29.2011. Pt. notified no more refills until she has f/u OV.  Initial call taken by: Brynda Rim,  January 04, 2010 8:11 AM  Follow-up for Phone Call        pt is aware Follow-up by: [place] Shannon,  January 04, 2010 9:14 AM

## 2010-05-16 NOTE — Assessment & Plan Note (Signed)
Summary: Diabetes/HTN   Vital Signs:  Patient profile:   65 year old female Weight:      183.5 pounds BMI:     39.85 Temp:     97.1 degrees F oral Pulse rate:   64 / minute Pulse rhythm:   regular Resp:     16 per minute BP sitting:   160 / 70  (left arm) Cuff size:   large  Vitals Entered By: Levon Hedger (February 23, 2010 3:16 PM)  Nutrition Counseling: Patient's BMI is greater than 25 and therefore counseled on weight management options. CC: follow-up visit DM.Marland KitchenMarland Kitchenpt has not had any of her medication for 1 month., Hypertension Management, Lipid Management Is Patient Diabetic? Yes CBG Result 103 CBG Device ID B  Does patient need assistance? Functional Status Self care Ambulation Normal Comments pt has been without medicines because she had not been recertified   CC:  follow-up visit DM.Marland KitchenMarland Kitchenpt has not had any of her medication for 1 month., Hypertension Management, and Lipid Management.  History of Present Illness:  Pt into the office for follow up on diabete and htn.  Daughter present today with pt.  Diabetes Management History:      The patient is a 65 years old female who comes in for evaluation of DM Type 2.  She has not been enrolled in the "Diabetic Education Program".  She states understanding of dietary principles and is following her diet appropriately.  No sensory loss is reported.  Self foot exams are not being performed.  She is not checking home blood sugars.  She says that she is not exercising regularly.        Hypoglycemic symptoms are not occurring.  No hyperglycemic symptoms are reported.        No changes have been made to her treatment plan since last visit.    Hypertension History:      She denies headache, chest pain, and palpitations.  Pt has not had her metoprolol in the past month.        Positive major cardiovascular risk factors include female age 28 years old or older, diabetes, hyperlipidemia, hypertension, family history for ischemic heart  disease (females less than 72 years old), and current tobacco user.        Positive history for target organ damage include peripheral vascular disease.  Further assessment for target organ damage reveals no history of ASHD, cardiac end-organ damage (CHF/LVH), renal insufficiency, or hypertensive retinopathy.    Lipid Management History:      Positive NCEP/ATP III risk factors include female age 56 years old or older, diabetes, family history for ischemic heart disease (females less than 74 years old), current tobacco user, hypertension, and peripheral vascular disease.  Negative NCEP/ATP III risk factors include no ASHD (atherosclerotic heart disease) and no history of aortic aneurysm.        The patient states that she does not know about the "Therapeutic Lifestyle Change" diet.  The patient does not know about adjunctive measures for cholesterol lowering.  Adjunctive measures started by the patient include ASA.  She expresses no side effects from her lipid-lowering medication.  The patient denies any symptoms to suggest myopathy or liver disease.      Habits & Providers  Alcohol-Tobacco-Diet     Alcohol drinks/day: 0     Tobacco Status: current     Tobacco Counseling: to quit use of tobacco products     Cigarette Packs/Day: 1/2  Exercise-Depression-Behavior     Does Patient Exercise:  no     Have you felt down or hopeless? no     Have you felt little pleasure in things? no     Depression Counseling: not indicated; screening negative for depression     Drug Use: no     Seat Belt Use: 75     Sun Exposure: occasionally  Allergies: No Known Drug Allergies  Review of Systems General:  Denies fever. CV:  Denies fatigue. Resp:  Denies cough. GI:  Denies abdominal pain, nausea, and vomiting. GU:  Complains of nocturia and urinary frequency. MS:  Complains of joint pain.  Physical Exam  General:  alert.   Head:  normocephalic.   Eyes:  glasses Lungs:  normal breath sounds.     Heart:  normal rate and regular rhythm.   Abdomen:  normal bowel sounds.   Msk:  up to the exam table Extremities:  1+ left pedal edema and trace right pedal edema.   Neurologic:  alert & oriented X3.   Skin:  color normal.   Psych:  Oriented X3.    Diabetes Management Exam:    Foot Exam (with socks and/or shoes not present):       Sensory-Monofilament:          Left foot: normal          Right foot: normal   Impression & Recommendations:  Problem # 1:  DIABETES MELLITUS, TYPE II (ICD-250.00) Hgba1c = 6.0 flu vaccine given today Her updated medication list for this problem includes:    Enalapril Maleate 10 Mg Tabs (Enalapril maleate) .Marland Kitchen... Take 2 tablets by mouth once daily for blood pressure    Aspir-low 81 Mg Tbec (Aspirin) .Marland Kitchen... Take 1 tablet by mouth once a day  Orders: Capillary Blood Glucose/CBG (16109) UA Dipstick w/o Micro (manual) (60454) Hemoglobin A1C (83036) T-CBC w/Diff (09811-91478) T-TSH (29562-13086) T-Urine Microalbumin w/creat. ratio [phone])  Problem # 2:  HYPERTENSION (ICD-401.9) BP elevated  advised pt to re-start meds Her updated medication list for this problem includes:    Metoprolol Succinate 50 Mg Xr24h-tab (Metoprolol succinate) .Marland Kitchen... Take 1 tablet by mouth once a day (note med changed from tartrate to succinate)    Amlodipine Besylate 10 Mg Tabs (Amlodipine besylate) .Marland Kitchen... Take 1 tablet by mouth every morning    Enalapril Maleate 10 Mg Tabs (Enalapril maleate) .Marland Kitchen... Take 2 tablets by mouth once daily for blood pressure    Furosemide 40 Mg Tabs (Furosemide) .Marland Kitchen... Take 1 tablet by mouth once a day for swelling  Problem # 3:  HYPERLIPIDEMIA (ICD-272.4) continue current meds Her updated medication list for this problem includes:    Crestor 20 Mg Tabs (Rosuvastatin calcium) .Marland Kitchen... Take 1 tablet by mouth once a day  Problem # 4:  OBESITY (ICD-278.00) advised pt to monitor diet  Problem # 5:  TOBACCO USER (ICD-305.1) advise  cessation  Problem # 6:  NEPHROPATHY, DIABETIC (ICD-250.40)  Her updated medication list for this problem includes:    Enalapril Maleate 10 Mg Tabs (Enalapril maleate) .Marland Kitchen... Take 2 tablets by mouth once daily for blood pressure    Aspir-low 81 Mg Tbec (Aspirin) .Marland Kitchen... Take 1 tablet by mouth once a day  Orders: T-Comprehensive Metabolic Panel (32440-10272)  Complete Medication List: 1)  Metoprolol Succinate 50 Mg Xr24h-tab (Metoprolol succinate) .... Take 1 tablet by mouth once a day (note med changed from tartrate to succinate) 2)  Plavix 75 Mg Tabs (Clopidogrel bisulfate) .Marland Kitchen.. 1 by mouth once daily for stroke prevention 3)  Ditropan Xl  5 Mg Xr24h-tab (Oxybutynin chloride) .... Take 1 tablet by mouth once a day 4)  Vicodin 5-500 Mg Tabs (Hydrocodone-acetaminophen) .Marland Kitchen.. 1 by mouth q 8 hours as needed for severe pain #60 per month 5)  Amlodipine Besylate 10 Mg Tabs (Amlodipine besylate) .... Take 1 tablet by mouth every morning 6)  Enalapril Maleate 10 Mg Tabs (Enalapril maleate) .... Take 2 tablets by mouth once daily for blood pressure 7)  Desipramine Hcl 25 Mg Tabs (Desipramine hcl) .Marland Kitchen.. 1 by mouth at bedtime for feet 8)  Flonase 50 Mcg/act Susp (Fluticasone propionate) .... Use 2 sprays in each nostril each day for allergy season 9)  Furosemide 40 Mg Tabs (Furosemide) .... Take 1 tablet by mouth once a day for swelling 10)  Crestor 20 Mg Tabs (Rosuvastatin calcium) .... Take 1 tablet by mouth once a day 11)  Aspir-low 81 Mg Tbec (Aspirin) .... Take 1 tablet by mouth once a day  Other Orders: Flu Vaccine 67yrs + (16109) Admin 1st Vaccine (60454)  Diabetes Management Assessment/Plan:      The following lipid goals have been established for the patient: Total cholesterol goal of 200; LDL cholesterol goal of 70; HDL cholesterol goal of 40; Triglyceride goal of 150.  Her blood pressure goal is < 130/80.    Hypertension Assessment/Plan:      The patient's hypertensive risk group is category  C: Target organ damage and/or diabetes.  Her calculated 10 year risk of coronary heart disease is > 32 %.  Today's blood pressure is 160/70.  Her blood pressure goal is < 130/80.  Lipid Assessment/Plan:      Based on NCEP/ATP III, the patient's risk factor category is "history of coronary disease, peripheral vascular disease, cerebrovascular disease, or aortic aneurysm along with either diabetes, current smoker, or LDL > 130 plus HDL < 40 plus triglycerides > 200".  The patient's lipid goals are as follows: Total cholesterol goal is 200; LDL cholesterol goal is 70; HDL cholesterol goal is 40; Triglyceride goal is 150.    Patient Instructions: 1)  You have been given the flu vaccine today 2)  Blood pressure - elevated today but you have not been taking all your medications  3)  All medications have been filled today.  Get them from the pharmacy. 4)  Diabetes - good control. 5)  Follow up in 2-3 weeks for a blood pressure check with Triage note. Take your medications before this visit. Prescriptions: FUROSEMIDE 40 MG TABS (FUROSEMIDE) Take 1 tablet by mouth once a day for swelling  #30 x 5   Entered and Authorized by:   Lehman Prom FNP   Signed by:   Lehman Prom FNP on 02/23/2010   Method used:   Print then Give to Patient   RxID:   [id number] VICODIN 5-500 MG TABS (HYDROCODONE-ACETAMINOPHEN) 1 by mouth q 8 hours as needed for severe pain #60 per month  #60 x 0   Entered and Authorized by:   Lehman Prom FNP   Signed by:   Lehman Prom FNP on 02/23/2010   Method used:   Print then Give to Patient   RxID:   [id number] PLAVIX 75 MG TABS (CLOPIDOGREL BISULFATE) 1 by mouth once daily for stroke prevention  #30 Tablet x 5   Entered and Authorized by:   Lehman Prom FNP   Signed by:   Lehman Prom FNP on 02/23/2010   Method used:   Print then Give to Patient   RxID:   [id number] CRESTOR 20  MG TABS (ROSUVASTATIN CALCIUM) Take 1 tablet by mouth once a day  #30 x  5   Entered and Authorized by:   Lehman Prom FNP   Signed by:   Lehman Prom FNP on 02/23/2010   Method used:   Print then Give to Patient   RxID:   [id number] ENALAPRIL MALEATE 10 MG  TABS (ENALAPRIL MALEATE) Take 2 tablets by mouth once daily for blood pressure  #60 x 5   Entered and Authorized by:   Lehman Prom FNP   Signed by:   Lehman Prom FNP on 02/23/2010   Method used:   Print then Give to Patient   RxID:   [id number] AMLODIPINE BESYLATE 10 MG  TABS (AMLODIPINE BESYLATE) Take 1 tablet by mouth every morning  #30 Tablet x 5   Entered and Authorized by:   Lehman Prom FNP   Signed by:   Lehman Prom FNP on 02/23/2010   Method used:   Print then Give to Patient   RxID:   [id number] DITROPAN XL 5 MG XR24H-TAB (OXYBUTYNIN CHLORIDE) Take 1 tablet by mouth once a day  #30 x 5   Entered and Authorized by:   Lehman Prom FNP   Signed by:   Lehman Prom FNP on 02/23/2010   Method used:   Print then Give to Patient   RxID:   [id number] METOPROLOL SUCCINATE 50 MG XR24H-TAB (METOPROLOL SUCCINATE) Take 1 tablet by mouth once a day (note med changed from tartrate to succinate)  #30 x 5   Entered and Authorized by:   Lehman Prom FNP   Signed by:   Lehman Prom FNP on 02/23/2010   Method used:   Print then Give to Patient   RxID:   [id number]   Diabetic Foot Exam Last Podiatry Exam Date: 2008 Foot Inspection Is there a history of a foot ulcer?              No Is there a foot ulcer now?              No Can the patient see the bottom of their feet?          No Are the shoes appropriate in style and fit?          No Is there swelling or an abnormal foot shape?          No Are the toenails long?                Yes Are the toenails thick?                Yes Are the toenails ingrown?              No Is there heavy callous build-up?              No Is there pain in the calf muscle (Intermittent claudication) when walking?    NoIs  there a claw toe deformity?              No Is there elevated skin temperature?            No Is there limited ankle dorsiflexion?            No Is there foot or ankle muscle weakness?            No  Diabetic Foot Care Education Pulse Check          Right Foot  Left Foot Dorsalis Pedis:        normal            normal    10-g (5.07) Semmes-Weinstein Monofilament Test Performed by: Levon Hedger          Right Foot          Left Foot Visual Inspection                 Orders Added: 1)  Est. Patient Level IV [16109] 2)  Capillary Blood Glucose/CBG [82948] 3)  UA Dipstick w/o Micro (manual) [81002] 4)  Hemoglobin A1C [83036] 5)  T-CBC w/Diff [60454-09811] 6)  T-TSH [91478-29562] 7)  T-Urine Microalbumin w/creat. ratio [82043-[address]] 8)  T-Comprehensive Metabolic Panel [80053-22900] 9)  Flu Vaccine 68yrs + [90658] 10)  Admin 1st Vaccine [90471]   Immunizations Administered:  Influenza Vaccine # 1:    Vaccine Type: Fluvax 3+    Site: right deltoid    Mfr: GlaxoSmithKline    Dose: 0.5 ml    Route: IM    Given by: Levon Hedger    Exp. Date: 10/14/2010    Lot #: [id number]    VIS given: 11/08/09 version given February 23, 2010.  Flu Vaccine Consent Questions:    Do you have a history of severe allergic reactions to this vaccine? no    Any prior history of allergic reactions to egg and/or gelatin? no    Do you have a sensitivity to the preservative Thimersol? no    Do you have a past history of Guillan-Barre Syndrome? no    Do you currently have an acute febrile illness? no    Have you ever had a severe reaction to latex? no    Vaccine information given and explained to patient? yes    Are you currently pregnant? no    ndc  [phone]  Immunizations Administered:  Influenza Vaccine # 1:    Vaccine Type: Fluvax 3+    Site: right deltoid    Mfr: GlaxoSmithKline    Dose: 0.5 ml    Route: IM    Given by: Levon Hedger    Exp. Date: 10/14/2010     Lot #: [id number]    VIS given: 11/08/09 version given February 23, 2010.  Laboratory Results   Urine Tests  Date/Time Received: February 23, 2010 3:30 PM   Routine Urinalysis   Color: lt. yellow Appearance: Hazy Glucose: negative   (Normal Range: Negative) Bilirubin: negative   (Normal Range: Negative) Ketone: negative   (Normal Range: Negative) Spec. Gravity: 1.025   (Normal Range: 1.003-1.035) Blood: trace-intact   (Normal Range: Negative) pH: 6.0   (Normal Range: 5.0-8.0) Protein: >=300   (Normal Range: Negative) Urobilinogen: 0.2   (Normal Range: 0-1) Nitrite: negative   (Normal Range: Negative) Leukocyte Esterace: negative   (Normal Range: Negative)     Blood Tests   Date/Time Received: February 23, 2010 3:32 PM   HGBA1C: 6.0%   (Normal Range: Non-Diabetic - 3-6%   Control Diabetic - 6-8%) CBG Random:: 103       Last LDL:                                                 69 (05/24/2009 9:40:00 PM)          Diabetic Foot Exam Last Podiatry Exam Date: 2008 Pulse Check  Right Foot          Left Foot Dorsalis Pedis:        normal            normal    10-g (5.07) Semmes-Weinstein Monofilament Test Performed by: Levon Hedger          Right Foot          Left Foot Visual Inspection               Test Control      normal         normal Site 1         normal         normal Site 2         normal         normal Site 3         normal         normal Site 4         normal         normal Site 5         normal         normal Site 6         normal         normal Site 7         normal         normal Site 8         normal         normal Site 9         normal         normal Site 10         normal         normal  Impression      normal         normal   Prevention & Chronic Care Immunizations   Influenza vaccine: Fluvax 3+  (02/23/2010)    Tetanus booster: 09/23/2002: Historical    Pneumococcal vaccine: Historical  (09/23/2002)    H. zoster vaccine:  Not documented  Colorectal Screening   Hemoccult: Not documented    Colonoscopy: Not documented  Other Screening   Pap smear: Not documented    Mammogram: No specific mammographic evidence of malignancy.  Assessment: BIRADS 1.   (06/08/2009)   Mammogram action/deferral: Screening mammogram in 1 year.     (06/08/2009)    DXA bone density scan: Not documented   Smoking status: current  (02/23/2010)  Diabetes Mellitus   HgbA1C: 6.0  (02/23/2010)    Eye exam: Not documented    Foot exam: yes  (02/23/2010)   High risk foot: Not documented   Foot care education: Not documented   Foot exam due: 10/01/2007    Urine microalbumin/creatinine ratio: Not documented  Lipids   Total Cholesterol: 127  (05/24/2009)   LDL: 69  (05/24/2009)   LDL Direct: Not documented   HDL: 41  (05/24/2009)   Triglycerides: 87  (05/24/2009)    SGOT (AST): 14  (05/24/2009)   SGPT (ALT): 8  (05/24/2009) CMP ordered    Alkaline phosphatase: 99  (05/24/2009)   Total bilirubin: 0.3  (05/24/2009)  Hypertension   Last Blood Pressure: 160 / 70  (02/23/2010)   Serum creatinine: 0.59  (05/24/2009)   Serum potassium 3.7  (05/24/2009) CMP ordered   Self-Management Support :    Diabetes self-management support: Not documented    Hypertension self-management support: Not documented    Lipid self-management support:  Not documented    Nursing Instructions: Give Flu vaccine today

## 2010-05-16 NOTE — Progress Notes (Signed)
  Phone Note Outgoing Call   Summary of Call: Please contact [facility] for copy of recent records. She had recent echo and stress test.  Also request recent note from Dr. Allyson Sabal. Please fax my note to them as well. Initial call taken by: Brynda Rim,  May 24, 2009 4:43 PM  Follow-up for Phone Call        spoke with [facility] and they will have notes and test faxed over and i will fax them your notes Follow-up by: [place] Shannon,  May 25, 2009 11:45 AM

## 2010-05-16 NOTE — Letter (Signed)
Summary: *Referral Letter  [facility]  [address] [place], [place] [address]   Phone: [phone]  Fax: [phone]    05/24/2009  Thank you in advance for agreeing to see my patient:  [facility] [address] [place], [place]  [address]  Phone: [phone]  Dr. Allyson Sabal:  I had initially referred this patient to you for follow up on her peripheral arterial disease.  She has a h/o bilateral iliac artery stenting by Dr. Jacinto Halim in 2003.  She has had significant L>R swelling for several mos now and also reports exertional leg pain improved with rest.  I obtained ABIs in 12/2008 and these demonstrated moderate bilateral disease.  She then had carotid dopplers that demonstrated restenosis on the left and she was felt to be symptomatic.  She then had carotid stenting performed.  I understand that she has had an Echo and stress test since the stent that was "normal" per her report.  I understand that she has follow up with you soon.   Please evaluate her lower extremity complaints.  I wanted to see if you thought her claudication needed further evaluation or if her swelling could be related to her PAD.    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) 13)  OBESITY (ICD-278.00) 14)  TRANSIENT ISCHEMIC ATTACK, HX OF (ICD-V12.50) 15)  PERIPHERAL VASCULAR DISEASE (ICD-443.9) 16)  HYPERTENSION (ICD-401.9) 17)  HYPERLIPIDEMIA (ICD-272.4) 18)  DIABETES MELLITUS, TYPE II (ICD-250.00)   Current Medications: 1)  METOPROLOL SUCCINATE 50 MG  XR24H-TAB (METOPROLOL SUCCINATE) Take 1 tablet by mouth once a day (note med changed from tartrate to succinate) 2)  PLAVIX 75 MG TABS (CLOPIDOGREL BISULFATE) 1 by mouth once daily for stroke prevention 3)  DITROPAN XL 5 MG XR24H-TAB (OXYBUTYNIN CHLORIDE) Take 1 tablet by mouth once a day 4)  VICODIN 5-500 MG TABS (HYDROCODONE-ACETAMINOPHEN) 1 by mouth q 8 hours as needed for severe pain #60 per month 5)  AMLODIPINE BESYLATE 10 MG  TABS (AMLODIPINE BESYLATE) Take 1 tablet by mouth every morning 6)  ENALAPRIL MALEATE 10 MG  TABS (ENALAPRIL MALEATE) Take 2 tablets by mouth once daily for blood pressure 7)  DESIPRAMINE HCL 25 MG  TABS (DESIPRAMINE HCL) 1 by mouth at bedtime for feet 8)  FLONASE 50 MCG/ACT SUSP (FLUTICASONE PROPIONATE) Use 2 sprays in each nostril each day for allergy season 9)  FUROSEMIDE 40 MG TABS (FUROSEMIDE) Take 1 tablet by mouth once a day for swelling 10)  CRESTOR 20 MG TABS (ROSUVASTATIN CALCIUM) Take 1 tablet by mouth once a day 11)  ASPIR-LOW 81 MG TBEC (ASPIRIN) Take 1 tablet by mouth once a day   Past Medical History: 1)  Diabetes mellitus, type II 3/04 2)  Hyperlipidemia 3)  Hypertension 1999 4)  Peripheral vascular disease 05/29/01 5)  Transient ischemic attack, hx of 2/03 6)  Lipid panel 09/10/06   Prior History of Blood Transfusions:   Pertinent Labs:    Thank you again for agreeing to see our patient; please contact us if you have any further questions or need additional information.  Sincerely,  Tereso Newcomer PA-C

## 2010-05-16 NOTE — Assessment & Plan Note (Signed)
Summary: THYROID FU//////KT   Vital Signs:  Patient profile:   65 year old female Height:      57 inches Weight:      186 pounds BMI:     40.40 Temp:     97.3 degrees F oral Pulse rate:   52 / minute Pulse rhythm:   regular Resp:     18 per minute BP sitting:   200 / 70  (left arm) Cuff size:   large  Vitals Entered By: Armenia Shannon (May 24, 2009 10:46 AM) CC: thyroid f/u... Is Patient Diabetic? Yes Pain Assessment Patient in pain? no       Does patient need assistance? Functional Status Self care Ambulation Normal   CC:  thyroid f/u....  History of Present Illness: 65 year old female who presents for followup.  Since last being seen, she was referred to vascular surgery for restenosis in her left carotid artery.  She has since undergone left carotid stenting and she's also had follow up with Dr. Gery Pray at [facility] heart [facility] vascular.  She's had a stress test and echocardiogram were both within normal limits, per her report.  I originally sent her to cardiology for further evaluation of her leg edema.  She has bilateral leg pain with exertion that gets better with rest.  She also has left leg edema > right.   She has a history of bilateral iliac artery stenting in the past.  Her ABIs were moderately abnormal when I checked him several months ago.  I believe that, due to her significant carotid stenosis that seem to be symptomatic, her leg problems have not yet been evaluated.  She denies any chest pain or shortness of breath her she denies any numbness or tingling on her right side.  She denies any syncope.  She denies any lightheadedness.  Problems Prior to Update: 1)  Preventive Health Care  (ICD-V70.0) 2)  Urinalysis, Abnormal  (ICD-791.9) 3)  Nontoxic Multinodular Goiter  (ICD-241.1) 4)  Thyromegaly  (ICD-240.9) 5)  Edema  (ICD-782.3) 6)  Pain, Chronic Nec  (ICD-338.29) 7)  Diabetic Retinopathy  (ICD-250.50) 8)  Nephropathy, Diabetic  (ICD-250.40) 9)   Incontinence  (ICD-788.30) 10)  Tobacco User  (ICD-305.1) 11)  Glaucoma Nos  (ICD-365.9) 12)  Carotid Artery Stenosis, Left  (ICD-433.10) 13)  Obesity  (ICD-278.00) 14)  Transient Ischemic Attack, Hx of  (ICD-V12.50) 15)  Peripheral Vascular Disease  (ICD-443.9) 16)  Hypertension  (ICD-401.9) 17)  Hyperlipidemia  (ICD-272.4) 18)  Diabetes Mellitus, Type II  (ICD-250.00)  Current Medications (verified): 1)  Metoprolol Tartrate 50 Mg Tabs (Metoprolol Tartrate) .Marland Kitchen.. 1 By Mouth Two Times A Day 2)  Plavix 75 Mg Tabs (Clopidogrel Bisulfate) .Marland Kitchen.. 1 By Mouth Once Daily For Stroke Prevention 3)  Ditropan Xl 5 Mg Xr24h-Tab (Oxybutynin Chloride) .... Take 1 Tablet By Mouth Once A Day 4)  Vicodin 5-500 Mg Tabs (Hydrocodone-Acetaminophen) .Marland Kitchen.. 1 By Mouth Q 8 Hours As Needed For Severe Pain #60 Per Month 5)  Amlodipine Besylate 10 Mg  Tabs (Amlodipine Besylate) .... Take 1 Tablet By Mouth Every Morning 6)  Enalapril Maleate 10 Mg  Tabs (Enalapril Maleate) .Marland Kitchen.. 1 Two Times A Day 7)  Desipramine Hcl 25 Mg  Tabs (Desipramine Hcl) .Marland Kitchen.. 1 By Mouth At Bedtime For Feet 8)  Flonase 50 Mcg/act Susp (Fluticasone Propionate) .... Use 2 Sprays in Each Nostril Each Day For Allergy Season 9)  Furosemide 40 Mg Tabs (Furosemide) .... 1/2 To 1 By Mouth Once Daily As Needed For  Swelling.  Call Our Office If You Take More Than 3 Doses in One Week. 10)  Crestor 20 Mg Tabs (Rosuvastatin Calcium) .... Take 1 Tablet By Mouth Once A Day  Allergies (verified): No Known Drug Allergies  Past History:  Past Surgical History: Reviewed history from 04/25/2009 and no changes required. Cholecystectomy Hysterectomy S/P right breast biopsy (benign) 04/10/01 cardiac cath, insignificant coronary disease with good left ventricular systolic function s/p left carotid endarterectomy...06/16/2001.Marland Kitchen.Dr.Todd Early.   a.  s/p left carotid stent 02/2009 secondary restenosis of LICA  Physical Exam  General:  alert, well-developed, and  well-nourished.   Head:  normocephalic and atraumatic.   Neck:  supple.   Lungs:  normal breath sounds, no crackles, and no wheezes.   Heart:  normal rate, regular rhythm, and no murmur.   Extremities:  1+ left pedal edema and trace right pedal edema.   Neurologic:  alert & oriented X3 and cranial nerves II-XII intact.   Skin:  large lipoma right scapula area  Psych:  normally interactive.     Impression & Recommendations:  Problem # 1:  HYPERTENSION (ICD-401.9) uncontrolled per her daughter she is missing a lot of meds will change enalapril to once daily for easier dosing also change metoprolol to succinate for once daily dosing (decrease to 50 mg as her HR is in 40-50s and not sure she is taking two times a day) f/u BP later this week . . . adjust if necessary f/u with me in 2 weeks  Her updated medication list for this problem includes:    Metoprolol Succinate 50 Mg Xr24h-tab (Metoprolol succinate) .Marland Kitchen... Take 1 tablet by mouth once a day (note med changed from tartrate to succinate)    Amlodipine Besylate 10 Mg Tabs (Amlodipine besylate) .Marland Kitchen... Take 1 tablet by mouth every morning    Enalapril Maleate 10 Mg Tabs (Enalapril maleate) .Marland Kitchen... Take 2 tablets by mouth once daily for blood pressure    Furosemide 40 Mg Tabs (Furosemide) .Marland Kitchen... Take 1 tablet by mouth once a day for swelling  Orders: T-Comprehensive Metabolic Panel ([id number])  Problem # 2:  HYPERLIPIDEMIA (ICD-272.4) check labs  Her updated medication list for this problem includes:    Crestor 20 Mg Tabs (Rosuvastatin calcium) .Marland Kitchen... Take 1 tablet by mouth once a day  Orders: T-Comprehensive Metabolic Panel [phone]) T-Lipid Profile ([address]562-13086)  Problem # 3:  URINALYSIS, ABNORMAL (ICD-791.9) recheck if + protein, get 24 hour urine  Orders: T-Urinalysis ([address]-96295)  Problem # 4:  DIABETES MELLITUS, TYPE II (ICD-250.00) check A1C  on diet tx  Her updated medication list for this problem includes:     Enalapril Maleate 10 Mg Tabs (Enalapril maleate) .Marland Kitchen... Take 2 tablets by mouth once daily for blood pressure    Aspir-low 81 Mg Tbec (Aspirin) .Marland Kitchen... Take 1 tablet by mouth once a day  Orders: T- Hemoglobin A1C (28413-24401)  Problem # 5:  DIABETIC  RETINOPATHY (ICD-250.50) sees eye doctor once a year  Her updated medication list for this problem includes:    Enalapril Maleate 10 Mg Tabs (Enalapril maleate) .Marland Kitchen... Take 2 tablets by mouth once daily for blood pressure    Aspir-low 81 Mg Tbec (Aspirin) .Marland Kitchen... Take 1 tablet by mouth once a day  Problem # 6:  NONTOXIC MULTINODULAR GOITER (ICD-241.1) recheck TSH in Spring time  Problem # 7:  EDEMA (ICD-782.3) still ? if related to PAD never seen for this (per patient) when her carotid stenosis was discovered will ask Dr. Gery Pray to eval for this  at her f/u OV continue lasix  Her updated medication list for this problem includes:    Furosemide 40 Mg Tabs (Furosemide) .Marland Kitchen... 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 1 by mouth once daily for stroke prevention    Aspir-low 81 Mg Tbec (Aspirin) .Marland Kitchen... Take 1 tablet by mouth once a day  Problem # 9:  PERIPHERAL VASCULAR DISEASE (ICD-443.9) f/u with Dr. Gery Pray and Dr. Myra Gianotti  Her updated medication list for this problem includes:    Plavix 75 Mg Tabs (Clopidogrel bisulfate) .Marland Kitchen... 1 by mouth once daily for stroke prevention  Problem # 10:  Preventive Health Care (ICD-V70.0) will eventually need to schedule CPP will not need anymore paps after this one discuss colo and DEXA at CPP get mammo as overdue  Orders: Mammogram (Screening) (Mammo)  Complete Medication List: 1)  Metoprolol Succinate 50 Mg Xr24h-tab (Metoprolol succinate) .... Take 1 tablet by mouth once a  day (note med changed from tartrate to succinate) 2)  Plavix 75 Mg Tabs (Clopidogrel bisulfate) .Marland Kitchen.. 1 by mouth once daily for stroke prevention 3)  Ditropan Xl 5 Mg Xr24h-tab (Oxybutynin chloride) .... Take 1 tablet by mouth once a day 4)  Vicodin 5-500 Mg Tabs (Hydrocodone-acetaminophen) .Marland Kitchen.. 1 by mouth q 8 hours as needed for severe pain #60 per month 5)  Amlodipine Besylate 10 Mg Tabs (Amlodipine besylate) .... Take 1 tablet by mouth every morning 6)  Enalapril Maleate 10 Mg Tabs (Enalapril maleate) .... Take 2 tablets by mouth once daily for blood pressure 7)  Desipramine Hcl 25 Mg Tabs (Desipramine hcl) .Marland Kitchen.. 1 by mouth at bedtime for feet 8)  Flonase 50 Mcg/act Susp (Fluticasone propionate) .... Use 2 sprays in each nostril each day for allergy season 9)  Furosemide 40 Mg Tabs (Furosemide) .... Take 1 tablet by mouth once a day for swelling 10)  Crestor 20 Mg Tabs (Rosuvastatin calcium) .... Take 1 tablet by mouth once a day 11)  Aspir-low 81 Mg Tbec (Aspirin) .... Take 1 tablet by mouth once a day  Other Orders: Flu Vaccine 26yrs + ([address]) Admin 1st Vaccine (58527) Admin 1st Vaccine [facility]) [phone])  Patient Instructions: 1)  Follow up either Thursday or Friday for blood pressure check with the nurse. 2)  Schedule follow up with [facility] in 2 weeks for blood pressure. 3)  Take enalapril all at once (20 mg a day). 4)  Stop metoprolol tartrate and start taking metoprolol succinate 50 mg once a day for blood pressure. 5)  All other meds are the same. Prescriptions: METOPROLOL SUCCINATE 50 MG XR24H-TAB (METOPROLOL SUCCINATE) Take 1 tablet by mouth once a day (note med changed from tartrate to succinate)  #30 x 5   Entered and Authorized by:   Tereso Newcomer PA-C   Signed by:   Tereso Newcomer PA-C on 05/24/2009   Method used:   Print then Give to Patient   RxID:   [id number]   Laboratory Results   Blood Tests   Date/Time Received: May 24, 2009 10:50 AM   CBG Fasting::  119mg /dL       Influenza Vaccine    Vaccine Type: Fluvax 3+    Site: left deltoid    Dose: 0.5 ml    Route: IM  Given by: Armenia Shannon    Exp. Date: 10/13/2009    Lot #: [id number]    VIS given: 11/07/06 version given May 24, 2009.  Flu Vaccine Consent Questions    Do you have a history of severe allergic reactions to this vaccine? no    Any prior history of allergic reactions to egg and/or gelatin? no    Do you have a sensitivity to the preservative Thimersol? no    Do you have a past history of Guillan-Barre Syndrome? no    Do you currently have an acute febrile illness? no    Have you ever had a severe reaction to latex? no    Vaccine information given and explained to patient? yes    Are you currently pregnant? no  Appended Document: THYROID FU//////KT Received records from Dr. Hazle Coca office:   Clinical Lists Changes  Observations: Added new observation of NUCST CONC: Dobutamine Myoview:  Normal myocardial perfusion imaging. EF 65% Low risk scan (04/07/2009 10:56) Added new observation of ECHOINTERP: normal:   Ejection Fraction:  >=50%.  Mod concentric LVH Dopple flow pattern suggestive of pseudonormalization Small pericardial effusion RV systolic pressure normal. AV mildly sclerotic. (04/07/2009 10:54)       Echocardiogram  Procedure date:  04/07/2009  Findings:      normal:   Ejection Fraction:  >=50%.  Mod concentric LVH Dopple flow pattern suggestive of pseudonormalization Small pericardial effusion RV systolic pressure normal. AV mildly sclerotic.  Nuclear ETT  Procedure date:  04/07/2009  Findings:      Dobutamine Myoview:  Normal myocardial perfusion imaging. EF 65% Low risk scan

## 2010-05-16 NOTE — Progress Notes (Signed)
  Phone Note Outgoing Call   Summary of Call: Patient needs f/u.  Not seen in a while. Initial call taken by: Brynda Rim,  May 09, 2009 8:20 AM  Follow-up for Phone Call        spoke with pt and she wants to wait til daughter comes back from out of town Follow-up by: Armenia Shannon,  May 09, 2009 9:11 AM

## 2010-05-16 NOTE — Miscellaneous (Signed)
Summary: Mammo normal  Clinical Lists Changes  Observations: Added new observation of MAMMRECACT: Screening mammogram in 1 year.    (06/08/2009 17:31) Added new observation of MAMMOGRAM: No specific mammographic evidence of malignancy.  Assessment: BIRADS 1.  (06/08/2009 17:31)      Mammogram  Procedure date:  06/08/2009  Findings:      No specific mammographic evidence of malignancy.  Assessment: BIRADS 1.   Comments:      Screening mammogram in 1 year.

## 2010-05-16 NOTE — Progress Notes (Signed)
Summary: Query:  Refill Metoprolol?  Phone Note Outgoing Call   Summary of Call: Do you want to refill her metoprolol?  I spoke to her daughter and she said they have an eligibility appt. in Oct. -- that it had to be rescheduled from August. Initial call taken by: Dutch Quint RN,  January 02, 2010 3:14 PM  Follow-up for Phone Call        [facility] to fill x 3 mos.  Follow-up by: Tereso Newcomer PA-C,  January 02, 2010 6:00 PM  Additional Follow-up for Phone Call Additional follow up Details #1::        Rx refilled.  Unable to leave message for pt.  Dutch Quint RN  January 03, 2010 10:22 AM  Pt. notified of refills.  Dutch Quint RN  January 03, 2010 3:34 PM     Prescriptions: METOPROLOL SUCCINATE 50 MG XR24H-TAB (METOPROLOL SUCCINATE) Take 1 tablet by mouth once a day (note med changed from tartrate to succinate)  #30 x 2   Entered by:   Dutch Quint RN   Authorized by:   Tereso Newcomer PA-C   Signed by:   Dutch Quint RN on 01/03/2010   Method used:   Electronically to        [facility] [address] (retail)       [address]       [place], [place]  [id number]       Ph: [phone]       Fax: [phone]   RxID:   [id number]

## 2010-05-16 NOTE — Letter (Signed)
Summary: CARDIOLOGY NOTES  CARDIOLOGY NOTES   Imported By: Arta Bruce 09/05/2009 15:50:50  _____________________________________________________________________  External Attachment:    Type:   Image     Comment:   External Document

## 2010-05-16 NOTE — Letter (Signed)
Summary: *HSN Results Follow up  [facility]  [address] [place], [place] [address]   Phone: [phone]  Fax: [phone]      05/25/2009   Leone [facility] Denley [address] [place], [place]  [address]   Dear  Ms. Glendy Lucente,                            ____S.Drinkard,FNP   ____D. Gore,FNP       ____B. McPherson,MD   ____V. Rankins,MD    ____E. Mulberry,MD    ____N. Daphine Deutscher, FNP  ____D. Reche Dixon, MD    ____K. Philipp Deputy, MD    ____Other     This letter is to inform you that your recent test(s):  _______Pap Smear    ___X____Lab Test     _______X-ray    ___X____ is within acceptable limits  _______ requires a medication change  _______ requires a follow-up lab visit  _______ requires a follow-up visit with your provider   Comments:       _________________________________________________________ If you have any questions, please contact our office                     Sincerely,  Armenia Shannon [facility]

## 2010-05-22 ENCOUNTER — Inpatient Hospital Stay (HOSPITAL_COMMUNITY)
Admission: EM | Admit: 2010-05-22 | Discharge: 2010-06-12 | DRG: 034 | Disposition: A | Discharge status: Skilled Nursing Facility | Payer: MEDICARE | Attending: Internal Medicine | Admitting: Internal Medicine

## 2010-05-22 ENCOUNTER — Emergency Department (HOSPITAL_COMMUNITY): Payer: MEDICARE

## 2010-05-22 DIAGNOSIS — I639 Cerebral infarction, unspecified: Secondary | ICD-10-CM

## 2010-05-22 DIAGNOSIS — R4701 Aphasia: Secondary | ICD-10-CM

## 2010-05-22 HISTORY — DX: Essential (primary) hypertension: I10

## 2010-05-22 HISTORY — DX: Cerebral infarction, unspecified: I63.9

## 2010-05-22 LAB — COMPREHENSIVE METABOLIC PANEL
ALT: 19 U/L (ref 0–35)
AST: 23 U/L (ref 0–37)
Albumin: 2.8 g/dL — ABNORMAL LOW (ref 3.5–5.2)
Alkaline Phosphatase: 97 U/L (ref 39–117)
GFR calc Af Amer: >60 mL/min (ref >60)
Potassium: 3.9 mEq/L (ref 3.5–5.1)
Sodium: 131 mEq/L — ABNORMAL LOW (ref 135–145)
Total Protein: 6.8 g/dL (ref 6.0–8.3)

## 2010-05-22 LAB — DIFFERENTIAL
Basophils Relative: 0 % (ref 0–1)
Monocytes Absolute: 1.3 10*3/uL — ABNORMAL HIGH (ref 0.1–1.0)
Monocytes Relative: 7 % (ref 3–12)
Neutro Abs: 14.4 10*3/uL — ABNORMAL HIGH (ref 1.7–7.7)

## 2010-05-22 LAB — CK TOTAL AND CKMB (NOT AT ARMC)
CK, MB: 3.3 ng/mL (ref 0.3–4.0)
Relative Index: 2.5 (ref 0.0–2.5)

## 2010-05-22 LAB — CBC
HCT: 39.1 % (ref 36.0–46.0)
Hemoglobin: 13.2 g/dL (ref 12.0–15.0)
MCH: 28.4 pg (ref 26.0–34.0)
MCHC: 33.8 g/dL (ref 30.0–36.0)

## 2010-05-22 LAB — PROTIME-INR: Prothrombin Time: 13.7 seconds (ref 11.6–15.2)

## 2010-05-23 ENCOUNTER — Encounter (HOSPITAL_COMMUNITY): Payer: Self-pay | Admitting: Radiology

## 2010-05-23 ENCOUNTER — Inpatient Hospital Stay (HOSPITAL_COMMUNITY): Payer: MEDICARE

## 2010-05-23 DIAGNOSIS — D509 Iron deficiency anemia, unspecified: Secondary | ICD-10-CM | POA: Diagnosis present

## 2010-05-23 DIAGNOSIS — R1310 Dysphagia, unspecified: Secondary | ICD-10-CM | POA: Diagnosis present

## 2010-05-23 DIAGNOSIS — Y9210 Unspecified residential institution as the place of occurrence of the external cause: Secondary | ICD-10-CM | POA: Diagnosis not present

## 2010-05-23 DIAGNOSIS — IMO0002 Reserved for concepts with insufficient information to code with codable children: Secondary | ICD-10-CM | POA: Diagnosis not present

## 2010-05-23 DIAGNOSIS — E119 Type 2 diabetes mellitus without complications: Secondary | ICD-10-CM | POA: Diagnosis present

## 2010-05-23 DIAGNOSIS — Z7982 Long term (current) use of aspirin: Secondary | ICD-10-CM

## 2010-05-23 DIAGNOSIS — J9600 Acute respiratory failure, unspecified whether with hypoxia or hypercapnia: Secondary | ICD-10-CM | POA: Diagnosis not present

## 2010-05-23 DIAGNOSIS — Y842 Radiological procedure and radiotherapy as the cause of abnormal reaction of the patient, or of later complication, without mention of misadventure at the time of the procedure: Secondary | ICD-10-CM | POA: Diagnosis not present

## 2010-05-23 DIAGNOSIS — I63239 Cerebral infarction due to unspecified occlusion or stenosis of unspecified carotid arteries: Principal | ICD-10-CM | POA: Diagnosis present

## 2010-05-23 DIAGNOSIS — Z794 Long term (current) use of insulin: Secondary | ICD-10-CM

## 2010-05-23 DIAGNOSIS — I498 Other specified cardiac arrhythmias: Secondary | ICD-10-CM | POA: Diagnosis present

## 2010-05-23 DIAGNOSIS — I4892 Unspecified atrial flutter: Secondary | ICD-10-CM | POA: Diagnosis not present

## 2010-05-23 DIAGNOSIS — I10 Essential (primary) hypertension: Secondary | ICD-10-CM | POA: Diagnosis present

## 2010-05-23 DIAGNOSIS — Z87891 Personal history of nicotine dependence: Secondary | ICD-10-CM

## 2010-05-23 DIAGNOSIS — E785 Hyperlipidemia, unspecified: Secondary | ICD-10-CM | POA: Diagnosis present

## 2010-05-23 DIAGNOSIS — M25569 Pain in unspecified knee: Secondary | ICD-10-CM | POA: Diagnosis present

## 2010-05-23 DIAGNOSIS — I2510 Atherosclerotic heart disease of native coronary artery without angina pectoris: Secondary | ICD-10-CM | POA: Diagnosis present

## 2010-05-23 DIAGNOSIS — Z7902 Long term (current) use of antithrombotics/antiplatelets: Secondary | ICD-10-CM

## 2010-05-23 LAB — COMPREHENSIVE METABOLIC PANEL
ALT: 19 U/L (ref 0–35)
ALT: 18 U/L (ref 0–35)
AST: 20 U/L (ref 0–37)
AST: 23 U/L (ref 0–37)
Albumin: 2.8 g/dL — ABNORMAL LOW (ref 3.5–5.2)
CO2: 23 mEq/L (ref 19–32)
CO2: 24 mEq/L (ref 19–32)
Calcium: 8.8 mg/dL (ref 8.4–10.5)
Chloride: 101 mEq/L (ref 96–112)
Creatinine, Ser: 0.62 mg/dL (ref 0.4–1.2)
GFR calc Af Amer: >60 mL/min (ref >60)
GFR calc Af Amer: >60 mL/min (ref >60)
GFR calc non Af Amer: >60 mL/min (ref >60)
GFR calc non Af Amer: >60 mL/min (ref >60)
Sodium: 132 mEq/L — ABNORMAL LOW (ref 135–145)
Sodium: 133 mEq/L — ABNORMAL LOW (ref 135–145)
Total Bilirubin: 0.5 mg/dL (ref 0.3–1.2)
Total Protein: 6.6 g/dL (ref 6.0–8.3)

## 2010-05-23 LAB — PROTIME-INR: INR: 1.07 (ref 0.00–1.49)

## 2010-05-23 LAB — CBC
Hemoglobin: 11.9 g/dL — ABNORMAL LOW (ref 12.0–15.0)
MCH: 28.3 pg (ref 26.0–34.0)
MCHC: 33.5 g/dL (ref 30.0–36.0)
MCV: 84.3 fL (ref 78.0–100.0)
Platelets: 355 10*3/uL (ref 150–400)

## 2010-05-23 LAB — GLUCOSE, CAPILLARY
Glucose-Capillary: 128 mg/dL — ABNORMAL HIGH (ref 70–99)
Glucose-Capillary: 118 mg/dL — ABNORMAL HIGH (ref 70–99)

## 2010-05-23 LAB — LIPID PANEL
HDL: 55 mg/dL (ref >39)
LDL Cholesterol: 62 mg/dL (ref 0–99)
Triglycerides: 46 mg/dL (ref <150)
VLDL: 9 mg/dL (ref 0–40)

## 2010-05-23 LAB — URINALYSIS, ROUTINE W REFLEX MICROSCOPIC
Leukocytes, UA: NEGATIVE
Nitrite: POSITIVE — AB
Protein, ur: >300 mg/dL — AB
Specific Gravity, Urine: 1.015 (ref 1.005–1.030)
Urobilinogen, UA: 1 mg/dL (ref 0.0–1.0)

## 2010-05-23 LAB — URINE MICROSCOPIC-ADD ON

## 2010-05-23 LAB — MRSA PCR SCREENING: MRSA by PCR: NEGATIVE

## 2010-05-23 LAB — CARDIAC PANEL(CRET KIN+CKTOT+MB+TROPI)
Relative Index: 3.6 — ABNORMAL HIGH (ref 0.0–2.5)
Troponin I: 0.05 ng/mL (ref 0.00–0.06)

## 2010-05-23 LAB — HEMOGLOBIN A1C: Hgb A1c MFr Bld: 6.2 % — ABNORMAL HIGH (ref <5.7)

## 2010-05-23 LAB — MAGNESIUM: Magnesium: 2.3 mg/dL (ref 1.5–2.5)

## 2010-05-23 LAB — APTT: aPTT: 36 seconds (ref 24–37)

## 2010-05-24 ENCOUNTER — Inpatient Hospital Stay (HOSPITAL_COMMUNITY): Payer: MEDICARE

## 2010-05-24 ENCOUNTER — Encounter (HOSPITAL_COMMUNITY): Payer: Self-pay

## 2010-05-24 ENCOUNTER — Other Ambulatory Visit (HOSPITAL_COMMUNITY): Payer: MEDICARE

## 2010-05-24 DIAGNOSIS — I6529 Occlusion and stenosis of unspecified carotid artery: Secondary | ICD-10-CM

## 2010-05-24 LAB — GLUCOSE, CAPILLARY
Glucose-Capillary: 121 mg/dL — ABNORMAL HIGH (ref 70–99)
Glucose-Capillary: 136 mg/dL — ABNORMAL HIGH (ref 70–99)

## 2010-05-24 LAB — URINE CULTURE: Culture: NO GROWTH

## 2010-05-25 LAB — GLUCOSE, CAPILLARY
Glucose-Capillary: 160 mg/dL — ABNORMAL HIGH (ref 70–99)
Glucose-Capillary: 150 mg/dL — ABNORMAL HIGH (ref 70–99)

## 2010-05-25 LAB — CBC
HCT: 33.4 % — ABNORMAL LOW (ref 36.0–46.0)
MCV: 85.2 fL (ref 78.0–100.0)
Platelets: 343 10*3/uL (ref 150–400)
RBC: 3.92 MIL/uL (ref 3.87–5.11)
WBC: 13.3 10*3/uL — ABNORMAL HIGH (ref 4.0–10.5)

## 2010-05-25 LAB — BASIC METABOLIC PANEL
BUN: 12 mg/dL (ref 6–23)
Chloride: 108 mEq/L (ref 96–112)
Potassium: 3.6 mEq/L (ref 3.5–5.1)
Sodium: 137 mEq/L (ref 135–145)

## 2010-05-26 LAB — GLUCOSE, CAPILLARY
Glucose-Capillary: 87 mg/dL (ref 70–99)
Glucose-Capillary: 126 mg/dL — ABNORMAL HIGH (ref 70–99)
Glucose-Capillary: 97 mg/dL (ref 70–99)

## 2010-05-27 ENCOUNTER — Inpatient Hospital Stay (HOSPITAL_COMMUNITY): Payer: MEDICARE

## 2010-05-27 LAB — MAGNESIUM: Magnesium: 2.2 mg/dL (ref 1.5–2.5)

## 2010-05-27 LAB — CBC
MCH: 28.6 pg (ref 26.0–34.0)
MCHC: 34.4 g/dL (ref 30.0–36.0)
MCV: 83.3 fL (ref 78.0–100.0)
Platelets: 329 10*3/uL (ref 150–400)
RBC: 3.84 MIL/uL — ABNORMAL LOW (ref 3.87–5.11)
RDW: 14.1 % (ref 11.5–15.5)

## 2010-05-27 LAB — GLUCOSE, CAPILLARY: Glucose-Capillary: 134 mg/dL — ABNORMAL HIGH (ref 70–99)

## 2010-05-27 LAB — BASIC METABOLIC PANEL
BUN: 10 mg/dL (ref 6–23)
Calcium: 8.5 mg/dL (ref 8.4–10.5)
Creatinine, Ser: 0.53 mg/dL (ref 0.4–1.2)
GFR calc Af Amer: >60 mL/min (ref >60)

## 2010-05-28 LAB — URINALYSIS, ROUTINE W REFLEX MICROSCOPIC
Bilirubin Urine: NEGATIVE
Specific Gravity, Urine: 1.01 (ref 1.005–1.030)
Urine Glucose, Fasting: NEGATIVE mg/dL
pH: 7 (ref 5.0–8.0)

## 2010-05-28 LAB — GLUCOSE, CAPILLARY
Glucose-Capillary: 114 mg/dL — ABNORMAL HIGH (ref 70–99)
Glucose-Capillary: 101 mg/dL — ABNORMAL HIGH (ref 70–99)

## 2010-05-28 LAB — BASIC METABOLIC PANEL
CO2: 21 mEq/L (ref 19–32)
Calcium: 8.5 mg/dL (ref 8.4–10.5)
Chloride: 106 mEq/L (ref 96–112)
Creatinine, Ser: 0.45 mg/dL (ref 0.4–1.2)
GFR calc Af Amer: >60 mL/min (ref >60)
Sodium: 135 mEq/L (ref 135–145)

## 2010-05-28 LAB — CBC
Hemoglobin: 11.3 g/dL — ABNORMAL LOW (ref 12.0–15.0)
Platelets: 341 10*3/uL (ref 150–400)
RBC: 4.02 MIL/uL (ref 3.87–5.11)

## 2010-05-28 LAB — URINE MICROSCOPIC-ADD ON

## 2010-05-28 NOTE — Op Note (Addendum)
NAME:  [name], [name]              ACCOUNT NO.:  [id number]  MEDICAL RECORD NO.:  [id number]           PATIENT TYPE:  I  LOCATION:  3313                         FACILITY:  [facility]  PHYSICIAN:  Juleen China IV, MDDATE OF BIRTH:  07/13/1945  DATE OF PROCEDURE:  05/23/2010 DATE OF DISCHARGE:                              OPERATIVE REPORT   PREOPERATIVE DIAGNOSIS:  Left hemispheric stroke.  POSTOPERATIVE DIAGNOSIS:  Left hemispheric stroke.  PROCEDURE PERFORMED: 1. Ultrasound access, right femoral artery. 2. Aortic arch angiogram. 3. Bilateral carotid angiogram.  INDICATIONS:  [name] is a 65 year old female who underwent carotid endarterectomy approximately 10 years ago for a symptomatic stenosis in 2010.  She had recurrence of her disease, which was symptomatic, and she underwent left carotid stenting with distal embolic protection.  For the past several weeks, the patient has been having episodes of right arm and leg weakness as well as confusion.  Duplex ultrasound identified a high-grade stenosis within the previously deployed stent in order to confirm the Ultrasound diagnosis.  The patient comes in today for diagnostic arteriogram.  PROCEDURE:  The patient was identified in the holding area and taken to room 8, placed supine on the table.  She was prepped and draped in usual fashion.  Time-out was called.  The right femoral artery was evaluated by ultrasound that was found to be small and calcified, but patent and digital image was acquired.  The artery was accessed under ultrasound guidance with a micropuncture needle.  A micropuncture wire was then advanced into the iliac artery under fluoroscopic visualization.  This was followed by micropuncture sheath.  The wire and introducer were removed and a Bentson wire was placed followed by 5-French sheath.  A pigtail catheter was then advanced into the ascending aorta and an aortic arch angiogram was performed.  Next,  using a Bernstein II catheter, the right common carotid artery was selected and a right carotid angiogram was performed with intracranial images.  Next, the left common carotid artery was accessed with a Simmons I catheter, and a left carotid angiogram was performed with intracranial images. Neuroradiology will interpret with intracranial findings.  FINDINGS:  The arch angiogram:  A type 1 aortic arch is identified. There is mild amount of calcium at the origin of the innominate artery with a early takeoff of the right subclavian artery.  No definitive stenosis was identified within the innominate artery.  The right subclavian artery is widely patent.  The right vertebral artery originates from the right subclavian.  The right common carotid artery is patent without significant stenosis.  The origin of the left common carotid artery is calcified, but without significant stenosis.  The left common carotid artery is widely patent.  The left subclavian artery has calcific changes at its origin with mild stenosis identified.  The left vertebral artery originates from the left subclavian is widely patent.  The right carotid artery angiogram:  The right common carotid artery is widely patent.  The right external carotid artery and branches are widely patent.  There is only mild stenosis at the origin of the right internal carotid artery.  Left  carotid angiogram:  The left common carotid artery is widely patent.  A stent is visualized within the left common and internal carotid artery with a high-grade approximately 95% stenosis within the previously deployed stent.  The external carotid artery is occluded.  After the above images were performed, catheters and wires were removed, and the patient will taken to the holding area for sheath pull.  IMPRESSION: 1. No hemodynamically significant right carotid stenosis. 2. High-grade approximately 95% in-stent stenosis within the left     common  and internal carotid stent.  The left external carotid     artery is occluded.     Maureen Ny, MD     VWB/MEDQ  D:  05/24/2010  T:  05/25/2010  Job:  [id number]  Electronically Signed by Arelia Longest IV MD on 05/28/2010 10:02:39 PM

## 2010-05-28 NOTE — Consult Note (Addendum)
NAME:  [name], [name]              ACCOUNT NO.:  [id number]  MEDICAL RECORD NO.:  [id number]           PATIENT TYPE:  I  LOCATION:  3025                         FACILITY:  [facility]  PHYSICIAN:  Juleen China IV, MDDATE OF BIRTH:  08-23-1945  DATE OF CONSULTATION:  05/25/2010 DATE OF DISCHARGE:                                CONSULTATION   REASON FOR CONSULTATION:  Right arm and leg weakness.  HISTORY:  This is a 65 year old female who is admitted to the hospital with left-sided weakness and confusion.  She has recent admission a few weeks ago for altered mental status and a right arm and leg weakness. This was thought to be attributed to bradycardia as she had improvement in her symptoms with an increase in her heart rate.  She did have an ultrasound, which shows a high-grade stenosis within her left carotid stent.  The patient had undergone left carotid endarterectomy by Dr. Arbie Cookey in 2002 for symptomatic carotid stenosis.  In November 2010, she had a recurrent left carotid stenosis, which was symptomatic and underwent carotid stenting with distal embolic protection.  CT scan shows no evidence of acute intracranial abnormality with remote bilateral basal ganglia and periventricular lacunar infarcts with chronic small vessel white matter ischemic changes.  I am asked to evaluate her for recurrent stenosis given her current symptoms. Currently, the patient is somewhat noncommunicative and information is obtained from her daughter who describes recurrent intermittent right- sided weakness in both her arm and her leg as well as confusion and difficulty talking.  She does not relate that she has chest pain or shortness of breath.  REVIEW OF SYSTEMS:  Negative except what is mentioned in the H and P.  PAST MEDICAL HISTORY:  TIA with history of carotid endarterectomy and stenting, bradycardia, hypertension, diabetes, coronary artery disease, hyperlipidemia, tobacco abuse.  SOCIAL  HISTORY:  The patient lives in a facility.  She is full code. She quit smoking a month ago.  He is on nicotine patch.  No alcohol.  FAMILY HISTORY:  Positive for stroke and diabetes.  ALLERGIES:  IVP DYE.  PHYSICAL EXAMINATION:  VITAL SIGNS:  Temperature is 98.0 heart rate is 85, respirations 18, blood pressure is 158/74, O2 sats are 99% on 2 liters.  GENERAL:  The patient is very lethargic and noncommunicative. HEENT:  Within normal limits.  Well-healed left carotid incision. CARDIOVASCULAR:  Irregular rate.  Respirations are nonlabored. ABDOMEN:  Soft. EXTREMITIES:  Warm and well-perfused. NEUROLOGIC:  The patient shows significant right arm and leg weakness. She is having trouble answering questions.  She is attempting to eat but cannot raise her fork to her mouth. SKIN:  No rashes on her skin extremities. MUSCULOSKELETAL:  No gross abnormalities.  DIAGNOSTIC STUDIES:  Ultrasound reveals a high-grade in-stent stenosis on the left.  ASSESSMENT AND PLAN:  This is a 65 year old female with significant carotid history which includes carotid endarterectomy and carotid stenting, both of which were done for symptomatic disease.  The patient now has new symptoms which are consistent with her previous stroke-like symptoms.  We have an ultrasound that shows in-stent stenosis on the left.  In this setting, I would certainly be concerned about a TIA versus stroke given her multiple recent episodes.  She is not process of having an MRI to be performed, which will better define her acute status.  Because ultrasound notoriously shows falsely elevated velocities, I think we need to confirm that there is a stenosis within her stent to better recommend treatment options that I proposed aortic arch angiogram with bilateral carotid angiogram with intracranial images.  I discussed this with Dr. Pearlean Brownie.  We will plan on proceeding with carotid angiogram tomorrow.     Jorge Ny,  MD     VWB/MEDQ  D:  05/25/2010  T:  05/26/2010  Job:  [id number]  Electronically Signed by Arelia Longest IV MD on 05/28/2010 10:02:43 PM

## 2010-05-29 LAB — GLUCOSE, CAPILLARY
Glucose-Capillary: 102 mg/dL — ABNORMAL HIGH (ref 70–99)
Glucose-Capillary: 216 mg/dL — ABNORMAL HIGH (ref 70–99)
Glucose-Capillary: 94 mg/dL (ref 70–99)
Glucose-Capillary: 119 mg/dL — ABNORMAL HIGH (ref 70–99)

## 2010-05-29 LAB — URINE CULTURE
Colony Count: NO GROWTH
Culture  Setup Time: 201202121126
Culture: NO GROWTH

## 2010-05-29 LAB — CULTURE, BLOOD (ROUTINE X 2)
Culture  Setup Time: 201202070410
Culture  Setup Time: 201202070410
Culture: NO GROWTH

## 2010-05-29 LAB — CBC
HCT: 35.1 % — ABNORMAL LOW (ref 36.0–46.0)
Hemoglobin: 11.9 g/dL — ABNORMAL LOW (ref 12.0–15.0)
MCH: 28.5 pg (ref 26.0–34.0)
MCHC: 33.9 g/dL (ref 30.0–36.0)
MCV: 84 fL (ref 78.0–100.0)
Platelets: 321 10*3/uL (ref 150–400)
RBC: 4.18 MIL/uL (ref 3.87–5.11)
RDW: 13.9 % (ref 11.5–15.5)
WBC: 15.8 10*3/uL — ABNORMAL HIGH (ref 4.0–10.5)

## 2010-05-30 ENCOUNTER — Inpatient Hospital Stay (HOSPITAL_COMMUNITY): Payer: MEDICARE

## 2010-05-30 DIAGNOSIS — I6529 Occlusion and stenosis of unspecified carotid artery: Secondary | ICD-10-CM

## 2010-05-30 LAB — BLOOD GAS, ARTERIAL
Acid-base deficit: 8.4 mmol/L — ABNORMAL HIGH (ref 0.0–2.0)
Bicarbonate: 18 mEq/L — ABNORMAL LOW (ref 20.0–24.0)
Bicarbonate: 13.6 mEq/L — ABNORMAL LOW (ref 20.0–24.0)
Bicarbonate: 16.5 mEq/L — ABNORMAL LOW (ref 20.0–24.0)
Drawn by: 225631
Drawn by: 225631
FIO2: 0.35 %
O2 Saturation: 99.3 %
O2 Saturation: 98.5 %
O2 Saturation: 97.7 %
O2 Saturation: 97.7 %
PEEP: 5 cmH2O
PEEP: 5 cmH2O
PEEP: 5 cmH2O
RATE: 16 resp/min
RATE: 12 resp/min
TCO2: 14.1 mmol/L (ref 0–100)
TCO2: 17.5 mmol/L (ref 0–100)
pCO2 arterial: 26 mmHg — ABNORMAL LOW (ref 35.0–45.0)
pH, Arterial: 7.452 — ABNORMAL HIGH (ref 7.350–7.400)
pH, Arterial: 7.487 — ABNORMAL HIGH (ref 7.350–7.400)
pH, Arterial: 7.522 — ABNORMAL HIGH (ref 7.350–7.400)
pO2, Arterial: 202 mmHg — ABNORMAL HIGH (ref 80.0–100.0)
pO2, Arterial: 133 mmHg — ABNORMAL HIGH (ref 80.0–100.0)
pO2, Arterial: 104 mmHg — ABNORMAL HIGH (ref 80.0–100.0)
pO2, Arterial: 121 mmHg — ABNORMAL HIGH (ref 80.0–100.0)

## 2010-05-30 LAB — BASIC METABOLIC PANEL
BUN: 8 mg/dL (ref 6–23)
Creatinine, Ser: 0.47 mg/dL (ref 0.4–1.2)
GFR calc non Af Amer: >60 mL/min (ref >60)
Glucose, Bld: 126 mg/dL — ABNORMAL HIGH (ref 70–99)
Potassium: 4.1 mEq/L (ref 3.5–5.1)

## 2010-05-30 LAB — CBC
HCT: 31 % — ABNORMAL LOW (ref 36.0–46.0)
HCT: 27.9 % — ABNORMAL LOW (ref 36.0–46.0)
Hemoglobin: 9.4 g/dL — ABNORMAL LOW (ref 12.0–15.0)
MCH: 29 pg (ref 26.0–34.0)
MCH: 28.5 pg (ref 26.0–34.0)
MCHC: 34.5 g/dL (ref 30.0–36.0)
MCV: 84 fL (ref 78.0–100.0)
MCV: 84.5 fL (ref 78.0–100.0)
Platelets: 366 10*3/uL (ref 150–400)
RBC: 3.3 MIL/uL — ABNORMAL LOW (ref 3.87–5.11)
RDW: 14 % (ref 11.5–15.5)
WBC: 18.7 10*3/uL — ABNORMAL HIGH (ref 4.0–10.5)

## 2010-05-30 LAB — POCT ACTIVATED CLOTTING TIME
Activated Clotting Time: 352 seconds
Activated Clotting Time: 228 seconds

## 2010-05-30 LAB — GLUCOSE, CAPILLARY

## 2010-05-30 LAB — HEPARIN LEVEL (UNFRACTIONATED): Heparin Unfractionated: <0.1 IU/mL — ABNORMAL LOW (ref 0.30–0.70)

## 2010-05-30 LAB — CK TOTAL AND CKMB (NOT AT ARMC)
CK, MB: 2.2 ng/mL (ref 0.3–4.0)
Total CK: 35 U/L (ref 7–177)

## 2010-05-30 LAB — APTT: aPTT: 75 seconds — ABNORMAL HIGH (ref 24–37)

## 2010-05-30 MED ORDER — IOHEXOL 300 MG/ML  SOLN
150.0000 mL | Freq: Once | INTRAMUSCULAR | Status: AC | PRN
  Filled 2010-05-30: qty 150

## 2010-05-31 ENCOUNTER — Inpatient Hospital Stay (HOSPITAL_COMMUNITY): Payer: MEDICARE

## 2010-05-31 DIAGNOSIS — I6350 Cerebral infarction due to unspecified occlusion or stenosis of unspecified cerebral artery: Secondary | ICD-10-CM

## 2010-05-31 DIAGNOSIS — J9600 Acute respiratory failure, unspecified whether with hypoxia or hypercapnia: Secondary | ICD-10-CM

## 2010-05-31 LAB — URINALYSIS, ROUTINE W REFLEX MICROSCOPIC
Ketones, ur: NEGATIVE mg/dL
Leukocytes, UA: NEGATIVE
Nitrite: NEGATIVE
Specific Gravity, Urine: >1.046 — ABNORMAL HIGH (ref 1.005–1.030)
pH: 5 (ref 5.0–8.0)

## 2010-05-31 LAB — BLOOD GAS, ARTERIAL
Bicarbonate: 18.7 mEq/L — ABNORMAL LOW (ref 20.0–24.0)
PEEP: 5 cmH2O
Patient temperature: 98.6
pH, Arterial: 7.296 — ABNORMAL LOW (ref 7.350–7.400)
pO2, Arterial: 75.4 mmHg — ABNORMAL LOW (ref 80.0–100.0)

## 2010-05-31 LAB — GLUCOSE, CAPILLARY
Glucose-Capillary: 175 mg/dL — ABNORMAL HIGH (ref 70–99)
Glucose-Capillary: 135 mg/dL — ABNORMAL HIGH (ref 70–99)
Glucose-Capillary: 119 mg/dL — ABNORMAL HIGH (ref 70–99)

## 2010-05-31 LAB — BASIC METABOLIC PANEL
CO2: 18 mEq/L — ABNORMAL LOW (ref 19–32)
Chloride: 111 mEq/L (ref 96–112)
Creatinine, Ser: 0.9 mg/dL (ref 0.4–1.2)
GFR calc Af Amer: >60 mL/min (ref >60)
Potassium: 4.3 mEq/L (ref 3.5–5.1)
Sodium: 136 mEq/L (ref 135–145)

## 2010-05-31 LAB — URINE MICROSCOPIC-ADD ON

## 2010-05-31 LAB — URINE CULTURE: Special Requests: NEGATIVE

## 2010-05-31 LAB — MAGNESIUM: Magnesium: 1.9 mg/dL (ref 1.5–2.5)

## 2010-05-31 LAB — CBC
MCH: 28.5 pg (ref 26.0–34.0)
MCV: 84.8 fL (ref 78.0–100.0)
Platelets: 329 10*3/uL (ref 150–400)
RBC: 3.02 MIL/uL — ABNORMAL LOW (ref 3.87–5.11)

## 2010-06-01 ENCOUNTER — Inpatient Hospital Stay (HOSPITAL_COMMUNITY): Payer: MEDICARE

## 2010-06-01 DIAGNOSIS — R4020 Unspecified coma: Secondary | ICD-10-CM

## 2010-06-01 LAB — GLUCOSE, CAPILLARY
Glucose-Capillary: 134 mg/dL — ABNORMAL HIGH (ref 70–99)
Glucose-Capillary: 153 mg/dL — ABNORMAL HIGH (ref 70–99)
Glucose-Capillary: 190 mg/dL — ABNORMAL HIGH (ref 70–99)
Glucose-Capillary: 154 mg/dL — ABNORMAL HIGH (ref 70–99)
Glucose-Capillary: 125 mg/dL — ABNORMAL HIGH (ref 70–99)
Glucose-Capillary: 146 mg/dL — ABNORMAL HIGH (ref 70–99)

## 2010-06-01 LAB — BASIC METABOLIC PANEL
Calcium: 8 mg/dL — ABNORMAL LOW (ref 8.4–10.5)
GFR calc Af Amer: >60 mL/min (ref >60)
GFR calc non Af Amer: >60 mL/min (ref >60)
Sodium: 139 mEq/L (ref 135–145)

## 2010-06-01 LAB — CBC
Hemoglobin: 9 g/dL — ABNORMAL LOW (ref 12.0–15.0)
MCHC: 33.3 g/dL (ref 30.0–36.0)
Platelets: 358 10*3/uL (ref 150–400)
RDW: 14.6 % (ref 11.5–15.5)

## 2010-06-02 ENCOUNTER — Inpatient Hospital Stay (HOSPITAL_COMMUNITY): Payer: MEDICARE

## 2010-06-02 DIAGNOSIS — E119 Type 2 diabetes mellitus without complications: Secondary | ICD-10-CM

## 2010-06-02 DIAGNOSIS — I10 Essential (primary) hypertension: Secondary | ICD-10-CM

## 2010-06-02 LAB — PROCALCITONIN: Procalcitonin: <0.1 ng/mL

## 2010-06-02 LAB — CBC
Hemoglobin: 8.8 g/dL — ABNORMAL LOW (ref 12.0–15.0)
MCH: 28.4 pg (ref 26.0–34.0)
MCHC: 33.1 g/dL (ref 30.0–36.0)
MCV: 85.8 fL (ref 78.0–100.0)

## 2010-06-02 LAB — BASIC METABOLIC PANEL
GFR calc non Af Amer: >60 mL/min (ref >60)
Glucose, Bld: 162 mg/dL — ABNORMAL HIGH (ref 70–99)
Potassium: 4 mEq/L (ref 3.5–5.1)
Sodium: 139 mEq/L (ref 135–145)

## 2010-06-02 LAB — GLUCOSE, CAPILLARY
Glucose-Capillary: 180 mg/dL — ABNORMAL HIGH (ref 70–99)
Glucose-Capillary: 113 mg/dL — ABNORMAL HIGH (ref 70–99)

## 2010-06-02 LAB — CULTURE, RESPIRATORY W GRAM STAIN

## 2010-06-03 ENCOUNTER — Inpatient Hospital Stay (HOSPITAL_COMMUNITY): Payer: MEDICARE

## 2010-06-03 ENCOUNTER — Encounter (HOSPITAL_COMMUNITY): Payer: Self-pay | Admitting: Radiology

## 2010-06-03 LAB — BASIC METABOLIC PANEL
CO2: 28 mEq/L (ref 19–32)
Chloride: 105 mEq/L (ref 96–112)
Glucose, Bld: 97 mg/dL (ref 70–99)
Potassium: 4.1 mEq/L (ref 3.5–5.1)
Sodium: 140 mEq/L (ref 135–145)

## 2010-06-03 LAB — GLUCOSE, CAPILLARY
Glucose-Capillary: 108 mg/dL — ABNORMAL HIGH (ref 70–99)
Glucose-Capillary: 126 mg/dL — ABNORMAL HIGH (ref 70–99)
Glucose-Capillary: 103 mg/dL — ABNORMAL HIGH (ref 70–99)

## 2010-06-03 LAB — CBC
HCT: 27.8 % — ABNORMAL LOW (ref 36.0–46.0)
Hemoglobin: 9 g/dL — ABNORMAL LOW (ref 12.0–15.0)
WBC: 18.4 10*3/uL — ABNORMAL HIGH (ref 4.0–10.5)

## 2010-06-04 ENCOUNTER — Inpatient Hospital Stay (HOSPITAL_COMMUNITY): Payer: MEDICARE

## 2010-06-04 LAB — BASIC METABOLIC PANEL
Chloride: 103 mEq/L (ref 96–112)
Creatinine, Ser: 0.44 mg/dL (ref 0.4–1.2)
GFR calc Af Amer: >60 mL/min (ref >60)
GFR calc non Af Amer: >60 mL/min (ref >60)
Potassium: 3.8 mEq/L (ref 3.5–5.1)

## 2010-06-04 LAB — GLUCOSE, CAPILLARY
Glucose-Capillary: 105 mg/dL — ABNORMAL HIGH (ref 70–99)
Glucose-Capillary: 118 mg/dL — ABNORMAL HIGH (ref 70–99)
Glucose-Capillary: 161 mg/dL — ABNORMAL HIGH (ref 70–99)
Glucose-Capillary: 94 mg/dL (ref 70–99)

## 2010-06-04 LAB — BLOOD GAS, ARTERIAL
Acid-Base Excess: 4.5 mmol/L — ABNORMAL HIGH (ref 0.0–2.0)
Drawn by: 249101
O2 Content: 6 L/min
O2 Saturation: 94.3 %
Patient temperature: 98.2
TCO2: 29 mmol/L (ref 0–100)

## 2010-06-04 LAB — CBC
Platelets: 365 10*3/uL (ref 150–400)
RBC: 3.13 MIL/uL — ABNORMAL LOW (ref 3.87–5.11)
WBC: 16.9 10*3/uL — ABNORMAL HIGH (ref 4.0–10.5)

## 2010-06-05 ENCOUNTER — Inpatient Hospital Stay (HOSPITAL_COMMUNITY): Payer: MEDICARE

## 2010-06-05 LAB — CBC
HCT: 26.8 % — ABNORMAL LOW (ref 36.0–46.0)
Hemoglobin: 8.7 g/dL — ABNORMAL LOW (ref 12.0–15.0)
MCH: 27.7 pg (ref 26.0–34.0)
MCV: 85.4 fL (ref 78.0–100.0)
Platelets: 397 10*3/uL (ref 150–400)
RBC: 3.14 MIL/uL — ABNORMAL LOW (ref 3.87–5.11)
WBC: 18.3 10*3/uL — ABNORMAL HIGH (ref 4.0–10.5)

## 2010-06-05 LAB — BASIC METABOLIC PANEL
BUN: 13 mg/dL (ref 6–23)
CO2: 26 mEq/L (ref 19–32)
Chloride: 104 mEq/L (ref 96–112)
Potassium: 3.9 mEq/L (ref 3.5–5.1)

## 2010-06-05 LAB — BRAIN NATRIURETIC PEPTIDE: Pro B Natriuretic peptide (BNP): 81 pg/mL (ref 0.0–100.0)

## 2010-06-05 LAB — GLUCOSE, CAPILLARY
Glucose-Capillary: 154 mg/dL — ABNORMAL HIGH (ref 70–99)
Glucose-Capillary: 122 mg/dL — ABNORMAL HIGH (ref 70–99)

## 2010-06-06 DIAGNOSIS — M79609 Pain in unspecified limb: Secondary | ICD-10-CM

## 2010-06-06 LAB — GLUCOSE, CAPILLARY
Glucose-Capillary: 149 mg/dL — ABNORMAL HIGH (ref 70–99)
Glucose-Capillary: 150 mg/dL — ABNORMAL HIGH (ref 70–99)
Glucose-Capillary: 166 mg/dL — ABNORMAL HIGH (ref 70–99)

## 2010-06-06 LAB — CULTURE, BLOOD (ROUTINE X 2)
Culture  Setup Time: 201202150335
Culture  Setup Time: 201202150335
Culture: NO GROWTH

## 2010-06-07 LAB — GLUCOSE, CAPILLARY
Glucose-Capillary: 150 mg/dL — ABNORMAL HIGH (ref 70–99)
Glucose-Capillary: 147 mg/dL — ABNORMAL HIGH (ref 70–99)

## 2010-06-08 ENCOUNTER — Inpatient Hospital Stay (HOSPITAL_COMMUNITY): Payer: MEDICARE

## 2010-06-08 ENCOUNTER — Encounter (INDEPENDENT_AMBULATORY_CARE_PROVIDER_SITE_OTHER): Payer: Self-pay | Admitting: Nurse Practitioner

## 2010-06-08 LAB — BASIC METABOLIC PANEL
BUN: 16 mg/dL (ref 6–23)
CO2: 27 mEq/L (ref 19–32)
Calcium: 8.2 mg/dL — ABNORMAL LOW (ref 8.4–10.5)
Glucose, Bld: 147 mg/dL — ABNORMAL HIGH (ref 70–99)
Potassium: 4.3 mEq/L (ref 3.5–5.1)
Sodium: 138 mEq/L (ref 135–145)

## 2010-06-08 LAB — CBC
HCT: 24.8 % — ABNORMAL LOW (ref 36.0–46.0)
Hemoglobin: 8.1 g/dL — ABNORMAL LOW (ref 12.0–15.0)
MCHC: 32.7 g/dL (ref 30.0–36.0)
MCV: 87.6 fL (ref 78.0–100.0)

## 2010-06-08 LAB — GLUCOSE, CAPILLARY
Glucose-Capillary: 139 mg/dL — ABNORMAL HIGH (ref 70–99)
Glucose-Capillary: 142 mg/dL — ABNORMAL HIGH (ref 70–99)
Glucose-Capillary: 98 mg/dL (ref 70–99)

## 2010-06-08 LAB — FOLATE: Folate: 10.2 ng/mL

## 2010-06-08 LAB — VITAMIN B12: Vitamin B-12: 62 pg/mL — ABNORMAL LOW (ref 211–911)

## 2010-06-09 LAB — CBC
HCT: 25.2 % — ABNORMAL LOW (ref 36.0–46.0)
MCH: 28 pg (ref 26.0–34.0)
MCHC: 32.1 g/dL (ref 30.0–36.0)
MCV: 87.2 fL (ref 78.0–100.0)
RDW: 15.7 % — ABNORMAL HIGH (ref 11.5–15.5)

## 2010-06-09 LAB — GLUCOSE, CAPILLARY: Glucose-Capillary: 109 mg/dL — ABNORMAL HIGH (ref 70–99)

## 2010-06-09 LAB — FOLATE: Folate: 6.7 ng/mL

## 2010-06-09 LAB — BASIC METABOLIC PANEL
BUN: 12 mg/dL (ref 6–23)
Calcium: 8.5 mg/dL (ref 8.4–10.5)
GFR calc non Af Amer: >60 mL/min (ref >60)
Glucose, Bld: 125 mg/dL — ABNORMAL HIGH (ref 70–99)
Potassium: 4.2 mEq/L (ref 3.5–5.1)

## 2010-06-09 LAB — FERRITIN: Ferritin: 106 ng/mL (ref 10–291)

## 2010-06-09 LAB — IRON AND TIBC: Saturation Ratios: 13 % — ABNORMAL LOW (ref 20–55)

## 2010-06-10 LAB — GLUCOSE, CAPILLARY: Glucose-Capillary: 129 mg/dL — ABNORMAL HIGH (ref 70–99)

## 2010-06-11 ENCOUNTER — Inpatient Hospital Stay (HOSPITAL_COMMUNITY): Payer: MEDICARE

## 2010-06-11 LAB — CBC
HCT: 26.9 % — ABNORMAL LOW (ref 36.0–46.0)
Hemoglobin: 8.8 g/dL — ABNORMAL LOW (ref 12.0–15.0)
MCHC: 32.7 g/dL (ref 30.0–36.0)
RBC: 3.08 MIL/uL — ABNORMAL LOW (ref 3.87–5.11)
WBC: 15.6 10*3/uL — ABNORMAL HIGH (ref 4.0–10.5)

## 2010-06-11 LAB — GLUCOSE, CAPILLARY
Glucose-Capillary: 123 mg/dL — ABNORMAL HIGH (ref 70–99)
Glucose-Capillary: 189 mg/dL — ABNORMAL HIGH (ref 70–99)
Glucose-Capillary: 102 mg/dL — ABNORMAL HIGH (ref 70–99)

## 2010-06-11 LAB — BASIC METABOLIC PANEL
CO2: 23 mEq/L (ref 19–32)
Chloride: 104 mEq/L (ref 96–112)
Glucose, Bld: 117 mg/dL — ABNORMAL HIGH (ref 70–99)
Potassium: 4.1 mEq/L (ref 3.5–5.1)
Sodium: 134 mEq/L — ABNORMAL LOW (ref 135–145)

## 2010-06-12 LAB — BASIC METABOLIC PANEL
Calcium: 8.5 mg/dL (ref 8.4–10.5)
Creatinine, Ser: 0.65 mg/dL (ref 0.4–1.2)
GFR calc Af Amer: >60 mL/min (ref >60)
GFR calc non Af Amer: >60 mL/min (ref >60)
Sodium: 138 mEq/L (ref 135–145)

## 2010-06-12 LAB — CBC
MCHC: 31.3 g/dL (ref 30.0–36.0)
Platelets: 372 10*3/uL (ref 150–400)
RDW: 15.8 % — ABNORMAL HIGH (ref 11.5–15.5)
WBC: 12.8 10*3/uL — ABNORMAL HIGH (ref 4.0–10.5)

## 2010-06-12 NOTE — H&P (Signed)
  [name], BOHLMAN              ACCOUNT NO.:  [id number]  MEDICAL RECORD NO.:  [id number]           PATIENT TYPE:  E  LOCATION:  [facility]                         FACILITY:  [facility]  PHYSICIAN:  Eduard Clos, MDDATE OF BIRTH:  April 30, 1945  DATE OF ADMISSION:  05/22/2010 DATE OF DISCHARGE:                             HISTORY & PHYSICAL   ADDENDUM  The patient in addition has leukocytosis.  We are not sure the exact source or cause at this time.  I am going to get a chest x-ray and urinalysis.  Based on that, we will further see if the patient needs antibiotics or not.  A repeat CBC will be done in the a.m.  At this time, the patient is afebrile.  We will do get some blood cultures.  At this moment, I am not starting any antibiotics, but we will need to closely follow this.     Eduard Clos, MD     ANK/MEDQ  D:  05/22/2010  T:  05/22/2010  Job:  [id number]  Electronically Signed by Midge Minium MD on 06/12/2010 05:09:55 PM

## 2010-06-12 NOTE — H&P (Signed)
[name], [name]              ACCOUNT NO.:  [id number]  MEDICAL RECORD NO.:  [id number]           PATIENT TYPE:  E  LOCATION:  [facility]                         FACILITY:  [facility]  PHYSICIAN:  Eduard Clos, MDDATE OF BIRTH:  04/24/1945  DATE OF ADMISSION:  05/22/2010 DATE OF DISCHARGE:                             HISTORY & PHYSICAL   The patient's primary care physician is [facility].  PRIMARY CARDIOLOGIST:  Nanetta Batty, MD  CHIEF COMPLAINT:  Right-sided upper and lower extremity weakness.  HISTORY OF PRESENTING ILLNESS:  This is a 65 year old female with known history of diabetes mellitus type 2, hypertension, CAD, history of previous TIA's with history of left-sided carotid endarterectomy with stenting, history of bradycardia and off beta-blockers for the same, who was recently discharged from Hospital on May 08, 2010, for being worked up for altered mental status and TIA's, was seen by daughter today around 6:30 p.m. and she was found that her mother was not moving her right upper and right lower extremity and she also was looked confused  out of which the patient regained her strength completely, and also is more oriented.  The patient was brought into the ER as a code stroke, Dr. Marjory Lies of Neurology have seen the patient. Code stroke was counseled as the patient's condition improved and no TPO was required.  The patient at this time is alert, awake, oriented x3.  Denies any chest pain, nausea, vomiting.  Denies any difficulty speaking or swallowing. Denies any visual symptoms or headache.  Denies any weakness at this time of upper and lower extremities.  The patient denies any nausea, vomiting, abdominal pain, dysuria, discharge, diarrhea.  PAST MEDICAL HISTORY:  Significant for multiple TIA's with history of carotid endarterectomy on the left side with stent placement, history of bradycardia, off beta-blockers, history of hypertension, diabetes mellitus  type 2, history of CAD, history of hyperlipidemia, and history of cigarette smoking.  MEDICATIONS ON ADMISSION: 1. Vitamin D2 50,000 units weekly. 2. Nicotine patch. 3. Peridex oral rinse. 4. Prilosec 20 mg daily. 5. Plavix 95 daily. 6. Hydralazine 50 mg 4 times daily. 7. Lasix 40 mg daily. 8. Enalapril 10 mg p.o. twice daily. 9. Ditropan XL 5 mg daily. 10.Desipramine 25 mg daily at bedtime. 11.Flonase nasal spray. 12.Crestor 20 mg daily. 13.Aspirin 81 mg p.o. daily. 14.Amlodipine 10 mg daily.  ALLERGIES:  IVP DYE.  FAMILY HISTORY:  Significant for stroke and diabetes.  SOCIAL HISTORY:  The patient is at this time in rehab.  Full code.  She quit smoking just a month ago.  He is on nicotine patch.  Denies any alcohol or drug abuse.  REVIEW OF SYSTEMS:  As presented in the history of present illness, nothing else significant.  PHYSICAL EXAMINATION:  GENERAL:  The patient examined at bedside, not in acute distress. VITAL SIGNS:  Blood pressure is 186/72, pulse is around 106 per minute, temperature 98, respirations 18, O2 sat 100%. HEENT: Anicteric.  No pallor.  No facial asymmetry.  Tongue is midline. No neck rigidity.  No discharge from ears, eyes, nose or mouth. CHEST:  Bilateral air entry present.  No rhonchi.  No crepitation. HEART:  S1, S2 heard. ABDOMEN:  Soft, nontender.  Bowel sounds heard. CNS:  The patient is alert, awake, oriented to time, place, and person. Moves upper and lower extremities 5/5.  There is no pronator drift. There is no dysdiadochokinesia or ataxia. EXTREMITIES:  Pulse are feeble.  There is no acute ischemic changes.  No cyanosis or clubbing.  Mild edema both lower extremities.  LABORATORY DATA:  EKG shows some sinus rhythm with heart rate around 94 beats per minute with LVH and repolarization  abnormalities and nonspecific ST-T changes.  CT of the head without contrast shows no evidence of acute intracranial abnormality, remote bilateral  basal ganglia and periventricular  lacunar infarct and chronic small vessel white matter ischemic changes, chronic right pannus of sinus disease. CBC:  WBC 18.1, hemoglobin is 13.2, hematocrit 39.1, platelets 360. PT/INR 13.4 and 1.  Complete metabolic panel:  Sodium 131, potassium 3.9, chloride 97, carbon dioxide 24, glucose 153.  BUN 12, creatinine 0.6, total bilirubin is 0.2, alk phos 897, AST 23, ALT 19, total protein 6.8, albumin 2.8, calcium 8.8, creatinine kinase 134.  CK-MB is 3.3, troponin 0.04.  ASSESSMENT: 1. Transient ischemic attack. 2. Accelerated hypertension. 3. History of carotid endarterectomy on left side. 4. History peripheral artery disease status post stenting. 5. Leukocytosis. 6. History of cigarette smoking. 7. History diabetes mellitus type 2. 8. History of hyperlipidemia.  PLAN: 1. At this time, we will admit the patient to telemetry. 2. For her TIA, Dr. Marjory Lies of Neurology has already seen the     patient and recommended MRI of the brain, MRA of the brain and     neck.  The patient will be placed on neuro checks, swallow     evaluation and Neurology has also recommend continue aspirin,     Plavix. 3. Hypertension, we will continue place him on antihypertensives.  I     am going to place the patient on p.r.n. IV     hydralazine.  We slowly may have to increase antihypertensives. 4. At this time, I am holding her Lasix, gently hydrate the patient. 5. Further recommendation based on the patient's clinical course and     test ordered.     Eduard Clos, MD     ANK/MEDQ  D:  05/22/2010  T:  05/23/2010  Job:  [id number]  cc:   Nanetta Batty, M.D. [facility]  Electronically Signed by Midge Minium MD on 06/12/2010 05:10:18 PM

## 2010-06-13 NOTE — Discharge Summary (Signed)
[name], PICCININNI              ACCOUNT NO.:  [id number]  MEDICAL RECORD NO.:  [id number]           PATIENT TYPE:  I  LOCATION:  3037                         FACILITY:  [facility]  PHYSICIAN:  Andreas Blower, MD       DATE OF BIRTH:  1945/05/18  DATE OF ADMISSION:  05/22/2010 DATE OF DISCHARGE:                        DISCHARGE SUMMARY - REFERRING   PRIMARY CARE PHYSICIAN:  [facility].  CARDIOLOGIST:  Nanetta Batty, MD.  NEUROLOGIST:  During the course of hospital stay was [name], MD from Neurology.  VASCULAR SURGEON: Durene Cal IV, MD.  DISCHARGE DIAGNOSES: 1. Left hemispheric infarcts. 2. 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 3. Supraventricular tachycardia/atrial flutter, resolved. 4. Diabetes. 5. Hypertension. 6. Coronary artery disease. 7. History of bradycardia. 8. Hyperlipidemia. 9. History of tobacco abuse. 10.Status post bilateral iliac stents. 11.Acute on chronic right knee pain. 12.Right groin wound from right femoral access. 13.Dysphagia secondary to cerebrovascular accident, on dysphagia II     diet with nectar-thick liquids. 14.Iron-deficiency anemia. 15.Ventilator-dependent respiratory failure status post intubation     from May 30, 2010 until June 02, 2010. 16.Deconditioning. 17.History of transient ischemic attacks. 18.History of left carotid endarterectomy in 2003. 19.Left carotid stenting in November 2010. 20.Hyperlipidemia. 21.Noncritical coronary artery disease with catheterization in 2002. 22.Iron deficiency anemia.  RADIOLOGY/IMAGING: 1. The patient has had multiple imaging.  The patient had a MRI of the     brain without contrast on May 22, 2010, which shows acute and     subacute infarcts on the left hemisphere. 2. The patient had a head CT without contrast multiple times during     the  course of hospitalization, most recently on June 03, 2010,     which shows no acute intracranial abnormality or significant     interval change.  Stable atrophy and diffuse white matter disease.     Stable infarcts of the basal ganglia and high left frontal lobe. 3. The patient had x-ray of the right tibia and fibula on June 11, 2010, which shows osseous structures are normal. 4. The patient had portable chest x-ray on June 05, 2010, which     shows no change in the left basilar airspace disease and mild     interstitial edema. 5. The patient had abdominal x-ray on June 05, 2010, which showed     tip of the small-bore feeding tube is in the antrum of stomach and     additional loop of feeding tube have been placed.  The patient's     first head CT during the hospitalization was on May 23, 2010,     which shows atrophy with small-vessel chronic ischemic changes of     deep cerebral white matter.  Old bilateral basal ganglia and     question of left thalamic lacunar infarcts noted.  No acute     intracranial abnormalities identified, chronic right maxillary     sinusitis. 6. The patient had carotid Doppler on May 23, 2010,  which shows no     evidence of stenosis involving the right internal carotid artery.     Velocity consistent with greater than 80% stenosis involving the     left carotid artery. 7. The patient had transcranial Doppler on May 23, 2010, which     shows normal mean low velocities except reversal flow in the left     ophthalmic suggest high-grade proximal left internal carotid     stenosis or occlusion. 8. The patient had a lower extremity Doppler on June 06, 2010,     which shows no evidence of deep vein or superficial thrombus     involving the right lower extremity and left lower extremity.  No     evidence of Baker cyst on the right or left.  LABORATORY DATA: 1. CBC shows white count of 15.6, hemoglobin 8.8, hematocrit 26.9,      platelet count 372.  Electrolytes normal with a creatinine of 0.56. 2. Serum iron is 31, TIBC is 238%, saturation is 13, UIBC is 207.     Vitamin B12 is 56.  Serum folate is 6.7, ferritin is 106.  Troponin     was negative x4.  BNP was 81.  HOSPITAL COURSE:  The patient has had a complicated prolonged hospitalization. 1. Left hemispheric infarct secondary to restenosis of previously     stented left carotid artery with attempted angiogram and aborted     stent placement on May 30, 2010 with localized administration     of TPA.  The patient initially was admitted to the hospital on     May 22, 2010.  Neurology was consulted and Vascular Surgery was     consulted.  The patient had high-grade 95% in-stent stenosis within     the left common and internal carotid stent.  The left external     carotid artery was occluded.  As a result on May 30, 2010,     Vascular Surgery attempted to place stent, however, during the     procedure was noted that when the patient was asked to squeeze her     right hand, she could not; however, she was able to verbalize her     thoughts.  Given these changes, a stent was not placed and code     stroke was called.  The patient had a local TPA administered.     Subsequently after TPA was administered, the patient decompensated     and required to be intubated.  The patient was intubated from     May 30, 2010 until June 01, 2010.  Pulmonary Critical Care     was consulted for risk and management of respiratory failure and     vent management.  Given her overall multiple comorbidities and poor     prognosis and dysphagia with poor p.o. intake, Palliative Care Team     was consulted.  Palliative care after meeting with the patient and     family decided that the patient is to be full code and wanted a     continued aggressive medical management.  During the course of     hospital stay, the patient was transferred back to the hospitalist      service on June 07, 2010.  The patient currently is on aspirin     and Plavix. 2. Right knee pain.  X-ray was negative.  The patient had lower     extremity Doppler, which were negative for DVT on June 06, 2010.  I spoke with Dr. Shelle Iron, Orthopedic, who recommended given     her prolonged hospitalization to icing her knee and having the knee     placed in a brace.  If the patient does not improve, may need to     have her joint be tapped to evaluate for gout. 3. Right groin wound.  Most likely secondary to right femoral access     on May 25, 2010.  The patient was seen by wound care and wound     care is recommending wound management with calcium alginate     dressing into the distal left knee area. 4. SVT, atrial flutter resolved during the course of hospital stay. 5. Diabetes.  Blood sugars have been stable. 6. Hypertension.  The patient currently is on amlodipine, enalapril,     hydralazine, labetalol. 7. Dysphagia secondary to acute CVA.  The patient had difficulty     swallowing, initially was on PEG tube feeds; however, dysphagia     improved and after speech therapy evaluation, currently is on     dysphagia II diet with nectar-thick liquids. 8. History of coronary artery disease, stable. 9. Hyperlipidemia.  The patient is on rosuvastatin. 10.Iron-deficiency anemia, started on supplemental iron. 11.Tobacco use.  The patient encouraged smoking cessation.  Time spent on this discharge talking to the patient, talking to the consultants and coordinating care was 45 minutes.     Andreas Blower, MD     SR/MEDQ  D:  06/11/2010  T:  06/11/2010  Job:  [id number]  Electronically Signed by Wardell Heath Carina Chaplin  on 06/13/2010 09:12:28 PM

## 2010-06-13 NOTE — Discharge Summary (Signed)
[name], [name]              ACCOUNT NO.:  [id number]  MEDICAL RECORD NO.:  [id number]           PATIENT TYPE:  I  LOCATION:  3037                         FACILITY:  [facility]  PHYSICIAN:  Andreas Blower, MD       DATE OF BIRTH:  15-Oct-1945  DATE OF ADMISSION:  05/22/2010 DATE OF DISCHARGE:                        DISCHARGE SUMMARY - REFERRING   ADDENDUM  PRIMARY CARE PHYSICIAN:  [facility].  PRIMARY CARDIOLOGIST:  Nanetta Batty, MD  NEUROLOGIST:  [facility].  VASCULAR SURGEON: Durene Cal IV, MD  DISCHARGE DIAGNOSES: 1. Left hemispheric infarct. 2. 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 3. Diabetes. 4. Hypertension. 5. Coronary artery disease. 6. History of bradycardia. 7. Hyperlipidemia. 8. Tobacco use. 9. Supraventricular tachycardia/atrial flutter, resolved. 10.Status post bilateral iliac stents. 11.Acute-on-chronic right knee pain. 12.Right groin wound from right femoral access. 13.Dysphagia secondary to cerebrovascular accident on dysphagia II     diet with nectar-thick liquids. 14.Iron-deficiency anemia. 15.Ventilator-dependent respiratory failure status post intubation     from May 30, 2010, until June 02, 2010. 16.Deconditioning. 17.History of transient ischemic attacks. 18.History of carotid endarterectomy in 2003. 19.Left carotid stenting in November 2000. 20.Hyperlipidemia. 21.Noncritical coronary artery disease with catheterization in 2002.  HOSPITAL COURSE:  Please refer to discharge summary dictated on June 11, 2010, for more details.  In the interim, the patient's right knee improved with knee immobilizer and ice pack.  The patient was started on p.r.n. ibuprofen which she could continue for the next week as needed to help with right knee pain.  Dr. Shelle Iron from Orthopedics evaluated the patient.   Since the patient did not have joint effusion, did not need to have her joint being tapped.  Given that the patient's knee pain is improving no further workup needed at this time.  DISCHARGE MEDICATIONS: 1. Acetaminophen 650 mg every 6 hours as needed for pain. 2. Albuterol 2.5 mg inhaled every 3 hours as needed. 3. Artificial tears 1 drop every 4 hours as needed. 4. Aspirin 325 mg p.o. daily. 5. Ferrous sulfate 325 mg p.o. twice daily. 6. Ibuprofen 600 mg p.o. three times a day as needed to be     discontinued after June 19, 2010. 7. Insulin NovoLog 1-9 units subcu three times a day with meals. 8. Labetalol 150 mg every 8 hours. 9. Nystatin one application topically twice daily. 10.Thick-It food thickener 227 g p.o. daily 11.Amlodipine 10 mg p.o. daily. 12.Rosuvastatin 20 mg p.o. q.p.m. 13.Desipramine 25 mg p.o. daily at bedtime. 14.Ditropan XL 5 mg p.o. daily. 15.Norco10 mg p.o. twice daily. 16.Flonase 50 mcg 2 sprays daily. 17.Hydralazine 50 mg p.o. q.i.d. 18.Furosemide 40 mg p.o. daily. 19.Nicotine patch daily. 20.Peridex 10 mL p.o. four times a day. 21.Plavix 75 mg p.o. q.a.m. 22.Omeprazole 20 mg p.o. daily. 23.Vitamin D2 50,000 units p.o. q. week.  Time spent on discharge talking to the patient, talking to the consultants and coordinating care was 45 minutes.     Andreas Blower, MD     SR/MEDQ  D:  06/12/2010  T:  06/12/2010  Job:  [id number]  Electronically Signed by Wardell Heath Adonia Porada  on 06/13/2010 09:13:06 PM

## 2010-06-13 NOTE — Consult Note (Signed)
NAMEELLENORE, [name]              ACCOUNT NO.:  [id number]  MEDICAL RECORD NO.:  [id number]           PATIENT TYPE:  I  LOCATION:  3037                         FACILITY:  [facility]  PHYSICIAN:  Mauro Kaufmann, MD         DATE OF BIRTH:  1946-03-10  DATE OF CONSULTATION: DATE OF DISCHARGE:                                CONSULTATION   REQUESTING PHYSICIAN:  Kalman Shan, MD  REASON FOR CONSULTATION:  Goals of care.  HISTORY OF PRESENT ILLNESS:  A 65 year old female who was admitted on May 22, 2010 after the patient had right side upper and lower extremity weakness.  The patient has a history of diabetes mellitus type 2, hypertension, CAD, history of previous TIAs with a history of left- sided carotid endarterectomy with stenting, history of bradycardia.  The patient has been off beta-blockers for the bradycardia.  The patient was found to be in altered mental status and the patient was seen by Neurology.  Code stroke was called which was then cancelled because the patient's condition improved and no TPA was required.  So the patient was admitted with diagnosis of transient ischemic attack.  The patient had a carotid Doppler during this hospitalization which confirmed in- stent restenosis on the left.  The patient was supposed to undergo re- stenting of left carotid artery and which was aborted at that time because she had neurologic symptoms during the procedure.  The patient was given t-PA, but then she decompensated requiring intubation for the pneumonia and acute respiratory failure.  The patient's condition improved and she was extubated and transferred to the floor and was signed off by the CCM.  At this time, Neurology is following and the patient seems to be improving and she is able to move the upper right extremity and also the lower right extremity to some extent, but does have hemiparesis.  The patient also has a feeding tube in place and will be seen by PT, OT.  At  this time, the patient's palliative performance scale is less than 50%.  PHYSICAL EXAMINATION:  VITAL SIGNS:  The patient's temperature 98.4, pulse 79, respirations 20, blood pressure 122/64. NEUROLOGIC:  The patient is able to move right upper extremity and strength is 2/5 in the right lower extremity. EXTREMITIES:  The patient has positive right calf tenderness and also there is positive Homans' sign.  ASSESSMENT AND RECOMMENDATIONS: 1. The patient is full code.  I discussed in detail with the daughter     regarding the code status and at this time the patient's daughter     want the patient to be full code and wants to continue all the     treatments. 2. Right calf tenderness.  The patient has positive Homans sign and I     discussed with Jasmine December, the nurse practitioner from the Neurology     and I am going to order lower extremity venous Doppler to rule out     DVT and Neurology will follow the results of that. 3. Continue with NG tube at this time.  The patient's daughter wants     to  see the patient will improve with the therapy and will await the     swallow evaluation done by the Speech Therapy before making any     decision regarding the PEG tube.  Daughter is hopeful that the     patient will recover and will be able to eat as before. 4. Disposition to the rehab when the patient is stable. 5. Continue current management.  At this time, daughter does not want     to change any treatments and wants to continue all the care we are     providing.  Total time spent for this consultation is 65 minutes.  Time in is 11:25 and time out is 12:30.  More than 50% of time was spent in counseling and coordination of care.     Mauro Kaufmann, MD     GL/MEDQ  D:  06/06/2010  T:  06/06/2010  Job:  [id number]  Electronically Signed by Sibyl Parr LAMA  on 06/13/2010 08:20:18 AM

## 2010-06-14 NOTE — Consult Note (Signed)
  [name], [name]              ACCOUNT NO.:  [id number]  MEDICAL RECORD NO.:  [id number]           PATIENT TYPE:  I  LOCATION:  3037                         FACILITY:  [facility]  PHYSICIAN:  Jene Every, M.D.    DATE OF BIRTH:  24-Dec-1945  DATE OF CONSULTATION: DATE OF DISCHARGE:                                CONSULTATION   DICTATION BEGINS HERE  The patient lives at home.  PHYSICAL EXAMINATION:  She is an elderly female, minimal distress.  She has an obvious right hemiparesis.  She is supine in the hospital bed. Right knee was removed from the knee immobilizer, she has no effusion. There is no obvious deformity, no erythema or soft tissue swelling.  She has a slight genu valgum, and she is unable to actively extend or flex the knee.  She is unable to dorsiflex or plantarflex the foot.  The leg is warm, compartments are soft.  With extension, she has slight recurvatum. I am unable to elicit gross or instability varus or valgus stress from zero to 30 degrees.  __________ hip and ankle exam is unremarkable.  __________  sensation is intact.  No evidence of DVT.  Radiographs of the tib-fib demonstrate no evidence of fracture.  IMPRESSION:  Right knee strain secondary to lack of motor control with hemiparesis, with probable knee strain versus occult fracture.  No evidence of infection, sepsis, or effusion.  PLAN:  I have recommended dedicated x-rays to the knee through the knee immobilizer.  In the interim, ice, nonweightbearing.  I feel this is mainly secondary to the loss of control of the __________ on contralateral side.  We will proceed accordingly.     Jene Every, M.D.     [name]  D:  06/11/2010  T:  06/11/2010  Job:  [id number]  Electronically Signed by Jene Every M.D. on 06/14/2010 09:21:35 AM

## 2010-06-18 NOTE — Op Note (Addendum)
NAME:  [name], [name]              ACCOUNT NO.:  [id number]  MEDICAL RECORD NO.:  [id number]           PATIENT TYPE:  I  LOCATION:  3107                         FACILITY:  [facility]  PHYSICIAN:  Juleen China IV, MDDATE OF BIRTH:  1946/02/14  DATE OF PROCEDURE:  05/30/2010 DATE OF DISCHARGE:                              OPERATIVE REPORT   PREOPERATIVE DIAGNOSES:  Left brain stroke, left carotid stenosis recurrent.  POSTOPERATIVE DIAGNOSES:  Left brain stroke, left carotid stenosis recurrent.  PROCEDURES PERFORMED: 1. Ultrasound access right femoral artery. 2. First-order catheterization (left carotid artery). 3. Left carotid angiogram. 4. Aborted attempt at stent placement.  FINDINGS:  The patient developed right upper extremity weakness prior to any manipulation of the lesion.  We did have a sheath in the common carotid artery, but no wire had been advanced into the stent.  The procedure was aborted because of her change in neurologic status.  INDICATIONS:  [name] is a 65 year old female who underwent carotid endarterectomy approximately 10 years ago for symptomatic stenosis. She had recurrent stenosis about 2 years ago which was symptomatic and underwent carotid stenting.  For the past several weeks, she has been having recurrent episodes of confusion and right-sided weakness.  MRI has demonstrated embolic stroke to the left hemisphere.  An ultrasound and diagnostic angiography confirmed a high-grade in-stent stenosis on the left.  The patient yesterday morning was back to her baseline neurologically and with the intent of decreasing her risk of recurrent stroke, we have elected to proceed with stenting of her left carotid artery.  PROCEDURE:  The patient was identified in the holding area, taken to room A, placed supine on the table.  The right groin was prepped and draped in usual fashion.  Time-out was called.  The right femoral artery was accessed under  ultrasound guidance.  It was evaluated.  The ultrasound was found to be patent without significant calcification. Digital image was acquired.  A micropuncture wire was then advanced into the aorta and a micropuncture sheath was placed.  This was followed by 5 Bentson wire and a 90 cm 6-French sheath.  At this point, Angiomax was initiated.  Once the ACT was confirmed to be greater than 300, I proceeded with selecting the left common carotid artery.  The left carotid artery was cannulated with a JP one catheter and a reverse core wire.  The catheter was then advanced over the wire followed by the sheath into the left carotid artery.  Wire was never advanced into the stent.  At this point, the patient was asked to squeeze the right hand and at this point she could not.  She was able to verbalize her thoughts, but her right arm was no longer functional.  I took a contrast injection with the sheath in the left carotid artery that confirmed that there was no significant change in the findings in the left carotid and in-stent stenosis.  I did not feel comfortable proceeding given her change in neurologic status and therefore the procedure was aborted. The long sheath was exchanged out into a short 6-French sheath and the patient taken to holding  area.  Code stroke was called.  IMPRESSION:  Neurologic changes prior to manipulation of or attempt at crossing the stenosis in the left carotid artery, therefore, the procedure was aborted.     Jorge Ny, MD     VWB/MEDQ  D:  05/30/2010  T:  05/31/2010  Job:  [id number]  Electronically Signed by Arelia Longest IV MD on 06/18/2010 07:40:49 PM

## 2010-06-19 NOTE — Consult Note (Signed)
NAME:  [name], [name]              ACCOUNT NO.:  [id number]  MEDICAL RECORD NO.:  [id number]           PATIENT TYPE:  E  LOCATION:  [facility]                         FACILITY:  [facility]  PHYSICIAN:  [name], MD   DATE OF BIRTH:  24-Mar-1946  DATE OF CONSULTATION:  05/22/2010 DATE OF DISCHARGE:                                CONSULTATION   CHIEF COMPLAINT:  Right arm and right leg weakness, last seen normal at 1730.  Initial NIH stroke scale 1.  Code stroke was cancelled.  IV TPA was not given as the patient's recent stroke and symptoms were rapidly improving.  HISTORY OF PRESENT ILLNESS:  This is a 65 year old female with diabetes, coronary artery disease, two recent admissions to [facility], first January 13 through April 30, 2010 for weakness and bradycardia, the second from January 17 until May 08, 2010 for right-sided weakness and left brain ischemic infarction.  The patient was living at [facility] and per nursing report at 5:30 p.m. was in her normal state of health.  The patient's daughter came to visit her at 6:15 and found that the water pitcher was on the floor and the patient was having severe right arm and right leg weakness.  The patient's daughter describes that her arm and leg were flaccid with no movement. Paramedics were called and the patient was brought to [facility] for code stroke evaluation.  Upon arrival, the patient's symptoms rapidly improved.  She is quite hypertensive, otherwise she denies chest pain, shortness of breath, abdominal pain, nausea, vomiting, or visual changes.  PAST MEDICAL HISTORY: 1. Left carotid endarterectomy in 2003. 2. Left carotid stenting, 2010. 3. Bilateral iliac stenting. 4. Hypertension. 5. Hyperlipidemia. 6. Diabetes. 7. Coronary disease.  MEDICATIONS: 1. Aspirin 81 mg per day. 2. Plavix 75 mg per day. 3. Crestor 20 mg per day. 4. Enalapril 10 mg daily. 5. Lasix 40 mg daily. 6.  Amlodipine 10 mg daily. 7. Hydralazine 50 mg 4 times a day. 8. Prilosec. 9. Vitamin D. 10.Flonase. 11.Ditropan.  ALLERGIES:  IVP DYE allergy.  FAMILY HISTORY:  Notable for MI.  SOCIAL HISTORY:  The patient lives at [facility], undergoing rehab from recent stroke.  The patient quit tobacco use in January 2011.  Denies alcohol or illicit drug use.  REVIEW OF SYSTEMS:  As per the HPI, otherwise negative.  PHYSICAL EXAMINATION:  VITAL SIGNS:  Blood pressure 180s/90, heart rate 100 and regular rhythm, respirations 16, and O2 sat 98% on 2 L via nasal cannula. NEUROLOGIC:  The patient is awake and alert.  Language is fluent. Comprehension intact.  She has mild dysarthria.  Pupils are reactive from 2-1 mm.  Extraocular muscles are intact.  Visual fields are full to confrontation.  Facial sensation and strength symmetric.  Uvula is midline.  Shoulders symmetric.  Tongue is midline.  Motor examination: Normal bulk and tone in the left upper and left lower extremity.  No drift in the upper or lower extremities.  She has mild weakness of the right hand grip and the right foot dorsiflexion.  No ataxia on finger- nose-finger.  Sensory emanation is  intact to light touch.  Reflexes 1+ in the upper extremities, 1 at the knees and zero at the ankles. CARDIOVASCULAR:  Regular rate and rhythm.  No murmurs.  No carotid bruits.  LABORATORY DATA:  Blood testing pending.  Recent lab testing notable for LDL 83, HDL 46, cholesterol 143, triglycerides 70.  White blood cell count 18.1, hemoglobin 13.2, hematocrit 39.1, and platelets 360.  PT 13.7, INR 1.0, and PTT 32.  Sodium 131, potassium 3.9, glucose 153, BUN 12, and creatinine 0.65.  LFTs notable for albumin of 2.8.  Troponin 0.04.  CT scan of head which I reviewed shows moderate severe chronic small vessel ischemic disease and bilateral basal ganglia chronic lacunar ischemic infarctions.  MRI of the brain from May 03, 2010 shows scattered acute punctate ischemic infarctions within the distribution of the left middle cerebral artery.  Carotid ultrasound from May 03, 2010 shows left ICA greater than 8% stenosis.  ASSESSMENT AND PLAN:  This is a 65 year old female with diabetes, hypertension, history of left internal carotid artery stenosis status post endarterectomy and stenting, now with transient right arm and leg weakness which has resolved.  She still has mild slurred speech.  The patient is not an IV TPA candidate secondary to recent stroke and improving symptoms and likely represents symptomatic left internal carotid artery stenosis, transient ischemic attack versus stroke.  RECOMMENDATIONS: 1. The patient needs additional vascular imaging study, would prefer     MRA of the head and neck.  The patient reports history of IVP dye     allergy, she did successfully undergo catheter angiogram in 2010     when she had left carotid stenting. 2. MRI of the brain. 3. Blood pressure and diabetes lipid control. 4. Dual antiplatelet therapy with aspirin 325 mg per day and Plavix 75     mg per day. 5. Gradual blood pressure reduction over the next few days.  Would     avoid aggressive blood pressure control which could exacerbate     stroke symptoms in the short-term.     [name], MD    VP/MEDQ  D:  05/22/2010  T:  05/22/2010  Job:  [id number]  Electronically Signed by [name]  on 06/19/2010 12:39:45 PM

## 2010-07-04 NOTE — Progress Notes (Signed)
Quick Note:  This is a prior patient of mine from [facility]. This needs to be faxed to her new PCP at [facility]. ______

## 2010-07-11 ENCOUNTER — Ambulatory Visit (HOSPITAL_COMMUNITY)
Admission: RE | Admit: 2010-07-11 | Discharge: 2010-07-11 | Disposition: A | Discharge status: Home / Self Care | Payer: MEDICARE | Source: Ambulatory Visit | Attending: Internal Medicine | Admitting: Internal Medicine

## 2010-07-11 ENCOUNTER — Other Ambulatory Visit (HOSPITAL_COMMUNITY): Payer: Self-pay | Admitting: Internal Medicine

## 2010-07-11 DIAGNOSIS — R109 Unspecified abdominal pain: Secondary | ICD-10-CM | POA: Insufficient documentation

## 2010-07-11 DIAGNOSIS — L03319 Cellulitis of trunk, unspecified: Secondary | ICD-10-CM | POA: Insufficient documentation

## 2010-07-11 DIAGNOSIS — L02219 Cutaneous abscess of trunk, unspecified: Secondary | ICD-10-CM | POA: Insufficient documentation

## 2010-07-13 ENCOUNTER — Other Ambulatory Visit: Payer: Self-pay | Admitting: Internal Medicine

## 2010-07-13 ENCOUNTER — Ambulatory Visit (HOSPITAL_COMMUNITY): Payer: MEDICARE | Attending: Internal Medicine

## 2010-07-13 DIAGNOSIS — Z8673 Personal history of transient ischemic attack (TIA), and cerebral infarction without residual deficits: Secondary | ICD-10-CM | POA: Insufficient documentation

## 2010-07-13 DIAGNOSIS — I10 Essential (primary) hypertension: Secondary | ICD-10-CM | POA: Insufficient documentation

## 2010-07-13 DIAGNOSIS — Z79899 Other long term (current) drug therapy: Secondary | ICD-10-CM | POA: Insufficient documentation

## 2010-07-13 DIAGNOSIS — I96 Gangrene, not elsewhere classified: Secondary | ICD-10-CM | POA: Insufficient documentation

## 2010-07-13 DIAGNOSIS — E785 Hyperlipidemia, unspecified: Secondary | ICD-10-CM | POA: Insufficient documentation

## 2010-07-13 DIAGNOSIS — L89899 Pressure ulcer of other site, unspecified stage: Secondary | ICD-10-CM | POA: Insufficient documentation

## 2010-07-13 DIAGNOSIS — L8990 Pressure ulcer of unspecified site, unspecified stage: Secondary | ICD-10-CM | POA: Insufficient documentation

## 2010-07-13 DIAGNOSIS — G589 Mononeuropathy, unspecified: Secondary | ICD-10-CM | POA: Insufficient documentation

## 2010-07-13 DIAGNOSIS — D649 Anemia, unspecified: Secondary | ICD-10-CM | POA: Insufficient documentation

## 2010-07-13 DIAGNOSIS — I739 Peripheral vascular disease, unspecified: Secondary | ICD-10-CM | POA: Insufficient documentation

## 2010-07-13 DIAGNOSIS — Z7902 Long term (current) use of antithrombotics/antiplatelets: Secondary | ICD-10-CM | POA: Insufficient documentation

## 2010-07-13 DIAGNOSIS — R1310 Dysphagia, unspecified: Secondary | ICD-10-CM | POA: Insufficient documentation

## 2010-07-13 DIAGNOSIS — E1159 Type 2 diabetes mellitus with other circulatory complications: Secondary | ICD-10-CM | POA: Insufficient documentation

## 2010-07-13 DIAGNOSIS — Z7982 Long term (current) use of aspirin: Secondary | ICD-10-CM | POA: Insufficient documentation

## 2010-07-13 LAB — ABO/RH: ABO/RH(D): O POS

## 2010-07-13 LAB — GLUCOSE, CAPILLARY: Glucose-Capillary: 105 mg/dL — ABNORMAL HIGH (ref 70–99)

## 2010-07-13 NOTE — Consult Note (Signed)
[name], [name]              ACCOUNT NO.:  [id number]  MEDICAL RECORD NO.:  [id number]           PATIENT TYPE:  I  LOCATION:  3037                         FACILITY:  [facility]  PHYSICIAN:  Jene Every, M.D.    DATE OF BIRTH:  [birth year]  DATE OF CONSULTATION:  06/11/2010 DATE OF DISCHARGE:  06/12/2010                                CONSULTATION   REDICTATION  Initial date of dictation was February 26.  CHIEF COMPLAINT:  Right knee pain.  HISTORY:  This is a pleasant 65 year old female who was admitted on February 6 to the hospital with right upper extremity and right lower extremity weakness.  She has a history of diabetes, hypertension, and TIAs.  She had a history of the left carotid endarterectomy as well. She has had knee pain since admission when attempting to __________ moved.  I was called for Orthopedic consultation for evaluation.  The patient reports pain mainly when weightbearing, some in the bed.  She discussed this with the hospitalist taking care of her at the time. Suggested a knee immobilizer prior to me seeing her as well as a knee x- ray.  She has had no fevers, chills or central swelling.  PAST MEDICAL HISTORY:  Significant for: 1. Diabetes type 2. 2. Coronary artery disease. 3. Hypertension. 4. Stent placement. 5. Multiple TIAs. 6. Tobacco positive, alcohol negative.  MEDICATIONS:  Plavix, Prilosec, hydralazine, Lasix, enalapril, Ditropan, Flonase, Crestor, and aspirin.  ALLERGIES:  IVP DYE.  FAMILY HISTORY:  Significant for stroke and diabetes.  SOCIAL HISTORY:  She lives full-time in a rehab.  REVIEW OF SYSTEMS:  Again, negative for fevers or chills or swelling.  PHYSICAL EXAMINATION:  She is an elderly female with minimal distress. She is supine on the hospital bed.  Examination of the need from a knee immobilizer revealed some mild tenderness in the medial joint line.  No effusion.  She has difficulty extending the knee, although able to  do so.  I am unable to elicit Lachman.  She has a slight genu valgum.  She has 3/5 extension and flexion of the knee.  She is unable to dorsiflex, plantarflex her foot.  Compartments are soft.  Leg is warm.  She has 1+ dorsalis pedis and posterior tibial pulse.  With extension, there is slight recurvatum.  Unable to elicit gross instability, varus valgus stressing at 0 or 30 degrees.  The ipsilateral hip and ankle exam is unremarkable.  Pelvis is stable and her straight leg raise is negative. She has global weakness in the right upper extremity as well.  X-rays of the tib-fib partial of the knee demonstrates no fracture.  IMPRESSION:  Probable right knee strain secondary to right hemiparesis with a motor deficit to control the knee with probable knee strain versus less likely occult fracture.  No evidence of infection, sepsis, or effusion.  PLAN AND RECOMMENDATIONS:  I have recommended dedicated knee x-rays. Continue with a knee immobilizer for transfers rest, ice, elevation.  Do feel that controlling the knee in terms of the knee immobilizer in her functional deficit will help her overall symptoms.  ADDENDUM  Radiographs of the  knee demonstrate degenerative changes, but no evidence of a fracture.     Jene Every, M.D.     [name]  D:  07/03/2010  T:  07/03/2010  Job:  [id number]  Electronically Signed by Jene Every M.D. on 07/13/2010 07:22:58 AM

## 2010-07-14 ENCOUNTER — Encounter (INDEPENDENT_AMBULATORY_CARE_PROVIDER_SITE_OTHER): Payer: MEDICARE

## 2010-07-14 ENCOUNTER — Encounter (INDEPENDENT_AMBULATORY_CARE_PROVIDER_SITE_OTHER): Payer: MEDICARE | Admitting: Vascular Surgery

## 2010-07-14 DIAGNOSIS — M629 Disorder of muscle, unspecified: Secondary | ICD-10-CM

## 2010-07-14 DIAGNOSIS — I739 Peripheral vascular disease, unspecified: Secondary | ICD-10-CM

## 2010-07-14 LAB — CROSSMATCH: Unit division: 0

## 2010-07-17 ENCOUNTER — Ambulatory Visit: Payer: MEDICARE | Admitting: Surgery

## 2010-07-17 ENCOUNTER — Ambulatory Visit (INDEPENDENT_AMBULATORY_CARE_PROVIDER_SITE_OTHER): Payer: MEDICARE | Admitting: Surgery

## 2010-07-17 DIAGNOSIS — M629 Disorder of muscle, unspecified: Secondary | ICD-10-CM

## 2010-07-17 DIAGNOSIS — I70269 Atherosclerosis of native arteries of extremities with gangrene, unspecified extremity: Secondary | ICD-10-CM

## 2010-07-17 NOTE — Assessment & Plan Note (Signed)
OFFICE VISIT  [name], [name] DOB:  [birth year]                                       07/14/2010 CHART#:[id number]  HISTORY OF PRESENT ILLNESS:  This is a 65 year old patient, well-known to Dr. Durene Cal for carotid stenting.  This patient was re- consulted to [place] for ischemic changes in the right lower extremity. Apparently she has been at a rehab facility over a 3-week time period and has developed ischemic changes in her right leg.  Apparently 3 weeks ago, she was in some type of brace and they had to take her out of brace due to extensive amount of bruising.  The areas in which she bruised essentially became ischemic since that time.  This patient, unfortunately, is inarticulate.  It is not clear to me if this is due to aphasia resulting from her previous stroke but she was not able to interact at all during this whole conversation.  Also, she has had some ulcerations on her abdomen and also her left thigh.  Past medical history, past surgical history, allergies, medications and review of systems are unchanged from previous follow-up.  PHYSICAL EXAMINATION:  She had a blood pressure of 144/77, heart rate of 62, respirations were 22.  On focused examination of this poor lady's right leg:  On the right lower leg, there is black eschar along the entire length of her lateral calf and the heel also.  She also has an ulcer on her patella with dead tissue here.  Also she has an area of ulceration on her anterior thigh at the level of the joint.  The left thigh has an extended ulcerated area with some fibrinous exudate but otherwise some good underlying granulation tissue.  There is also a bandaged area on her abdomen that I did not take down.  She has palpable femoral pulses bilaterally.  However, I do not feel any popliteal or dorsalis pedis pulses bilaterally.  On the noninvasive vascular imaging, she had ABIs done on both sides. On the right side, it  was 0.23 and on the left 0.51.  She has monophasic signals in tibials bilaterally.  MEDICAL DECISION MAKING:  This is a 65 year old patient who is quite debilitated.  Based on her exam today, I saw no evidence that she is able to have cooperated with her rehab care, so I have concerns that actually in fact she had some degree of pressure ulcerations that may have triggered this whole process.  She clearly has critical limb ischemia in the right leg with extensive areas of dry gangrene.  I do not think she will be able to heal this, especially with evidence of 0.23 ABI.  I think that any attempt at revascularization on this patient will be pointless and in my opinion she should proceed with a right above-the-knee amputation given the presence of ulceration over the patella also.  At this point, the family wants to go ahead and see Dr. Myra Gianotti again this Monday, as they are ready had an appointment with him.  He is going to give a second opinion in regards to the need for amputation.  I would continue with the Silvadene cream to all the ulcerations and in my opinion, there is no advantage in this patient to try to attempt any revascularization as there is too much extensive tissue loss already and she is unlikely to be  able to heal it.    [name], MD Electronically Signed  BLC/MEDQ  D:  07/14/2010  T:  07/17/2010  Job:  [id number]

## 2010-07-18 ENCOUNTER — Encounter (HOSPITAL_BASED_OUTPATIENT_CLINIC_OR_DEPARTMENT_OTHER): Payer: MEDICARE | Attending: Nurse Practitioner

## 2010-07-18 DIAGNOSIS — E785 Hyperlipidemia, unspecified: Secondary | ICD-10-CM | POA: Insufficient documentation

## 2010-07-18 DIAGNOSIS — Z794 Long term (current) use of insulin: Secondary | ICD-10-CM | POA: Insufficient documentation

## 2010-07-18 DIAGNOSIS — Z7982 Long term (current) use of aspirin: Secondary | ICD-10-CM | POA: Insufficient documentation

## 2010-07-18 DIAGNOSIS — E1159 Type 2 diabetes mellitus with other circulatory complications: Secondary | ICD-10-CM | POA: Insufficient documentation

## 2010-07-18 DIAGNOSIS — Z8673 Personal history of transient ischemic attack (TIA), and cerebral infarction without residual deficits: Secondary | ICD-10-CM | POA: Insufficient documentation

## 2010-07-18 DIAGNOSIS — I739 Peripheral vascular disease, unspecified: Secondary | ICD-10-CM | POA: Insufficient documentation

## 2010-07-18 DIAGNOSIS — S81009A Unspecified open wound, unspecified knee, initial encounter: Secondary | ICD-10-CM | POA: Insufficient documentation

## 2010-07-18 DIAGNOSIS — I10 Essential (primary) hypertension: Secondary | ICD-10-CM | POA: Insufficient documentation

## 2010-07-18 DIAGNOSIS — X58XXXA Exposure to other specified factors, initial encounter: Secondary | ICD-10-CM | POA: Insufficient documentation

## 2010-07-18 DIAGNOSIS — S31109A Unspecified open wound of abdominal wall, unspecified quadrant without penetration into peritoneal cavity, initial encounter: Secondary | ICD-10-CM | POA: Insufficient documentation

## 2010-07-18 DIAGNOSIS — Z79899 Other long term (current) drug therapy: Secondary | ICD-10-CM | POA: Insufficient documentation

## 2010-07-18 DIAGNOSIS — E669 Obesity, unspecified: Secondary | ICD-10-CM | POA: Insufficient documentation

## 2010-07-18 DIAGNOSIS — I96 Gangrene, not elsewhere classified: Secondary | ICD-10-CM | POA: Insufficient documentation

## 2010-07-18 DIAGNOSIS — S81809A Unspecified open wound, unspecified lower leg, initial encounter: Secondary | ICD-10-CM | POA: Insufficient documentation

## 2010-07-18 NOTE — Assessment & Plan Note (Signed)
OFFICE VISIT  Bellanca, Erie V DOB:  12-May-1945                                       07/17/2010 CHART#:[id number]  REASON FOR VISIT:  Second opinion right lower extremity.  HISTORY:  This is a 65 year old female well-known to me having undergone carotid stenting by myself in 2010.  She represented in January with symptomatic recurrence.  She ultimately was stented by Dr. Corliss Skains but did have a periprocedural stroke.  She was seen by Dr. Imogene Burn as an add on Friday for evaluation of a right leg wound.  He felt that this was not salvageable and recommended a second opinion, therefore, they are back to see me today.  I spent approximately 1 hour discussing with the patient and family that I agreed with Dr. Nicky Pugh assessment.  Due to the extensive soft tissue necrosis, I do not think this is a salvageable situation.  I think our best most expeditious course is to proceed with an above-knee amputation on the right.  I have scheduled this for the April 12.  Everybody is in full agreement.    Jorge Ny, MD Electronically Signed  VWB/MEDQ  D:  07/17/2010  T:  07/18/2010  Job:  [id number]

## 2010-07-19 LAB — BASIC METABOLIC PANEL
Calcium: 8.7 mg/dL (ref 8.4–10.5)
Creatinine, Ser: 0.77 mg/dL (ref 0.4–1.2)
GFR calc Af Amer: >60 mL/min (ref >60)
GFR calc non Af Amer: >60 mL/min (ref >60)
Glucose, Bld: 107 mg/dL — ABNORMAL HIGH (ref 70–99)
Sodium: 139 mEq/L (ref 135–145)

## 2010-07-19 LAB — POCT I-STAT, CHEM 8
Hemoglobin: 14.3 g/dL (ref 12.0–15.0)
Sodium: 140 mEq/L (ref 135–145)
TCO2: 21 mmol/L (ref 0–100)

## 2010-07-19 LAB — CBC
Hemoglobin: 11.6 g/dL — ABNORMAL LOW (ref 12.0–15.0)
RDW: 14.4 % (ref 11.5–15.5)

## 2010-07-27 ENCOUNTER — Other Ambulatory Visit: Payer: Self-pay | Admitting: Surgery

## 2010-07-27 ENCOUNTER — Inpatient Hospital Stay (HOSPITAL_COMMUNITY): Payer: Medicare Other

## 2010-07-27 ENCOUNTER — Inpatient Hospital Stay (HOSPITAL_COMMUNITY)
Admission: RE | Admit: 2010-07-27 | Discharge: 2010-08-11 | DRG: 240 | Disposition: A | Discharge status: Skilled Nursing Facility | Payer: Medicare Other | Source: Ambulatory Visit | Attending: Surgery | Admitting: Surgery

## 2010-07-27 DIAGNOSIS — I472 Ventricular tachycardia, unspecified: Secondary | ICD-10-CM | POA: Diagnosis not present

## 2010-07-27 DIAGNOSIS — Z79899 Other long term (current) drug therapy: Secondary | ICD-10-CM

## 2010-07-27 DIAGNOSIS — I441 Atrioventricular block, second degree: Secondary | ICD-10-CM | POA: Diagnosis not present

## 2010-07-27 DIAGNOSIS — R1310 Dysphagia, unspecified: Secondary | ICD-10-CM | POA: Diagnosis present

## 2010-07-27 DIAGNOSIS — J4489 Other specified chronic obstructive pulmonary disease: Secondary | ICD-10-CM | POA: Diagnosis present

## 2010-07-27 DIAGNOSIS — G589 Mononeuropathy, unspecified: Secondary | ICD-10-CM | POA: Diagnosis present

## 2010-07-27 DIAGNOSIS — I739 Peripheral vascular disease, unspecified: Principal | ICD-10-CM | POA: Diagnosis present

## 2010-07-27 DIAGNOSIS — L97809 Non-pressure chronic ulcer of other part of unspecified lower leg with unspecified severity: Secondary | ICD-10-CM | POA: Diagnosis present

## 2010-07-27 DIAGNOSIS — Z8673 Personal history of transient ischemic attack (TIA), and cerebral infarction without residual deficits: Secondary | ICD-10-CM

## 2010-07-27 DIAGNOSIS — Z7902 Long term (current) use of antithrombotics/antiplatelets: Secondary | ICD-10-CM

## 2010-07-27 DIAGNOSIS — J449 Chronic obstructive pulmonary disease, unspecified: Secondary | ICD-10-CM | POA: Diagnosis present

## 2010-07-27 DIAGNOSIS — D649 Anemia, unspecified: Secondary | ICD-10-CM | POA: Diagnosis present

## 2010-07-27 DIAGNOSIS — E785 Hyperlipidemia, unspecified: Secondary | ICD-10-CM | POA: Diagnosis present

## 2010-07-27 DIAGNOSIS — I498 Other specified cardiac arrhythmias: Secondary | ICD-10-CM | POA: Diagnosis not present

## 2010-07-27 DIAGNOSIS — E119 Type 2 diabetes mellitus without complications: Secondary | ICD-10-CM | POA: Diagnosis present

## 2010-07-27 DIAGNOSIS — I442 Atrioventricular block, complete: Secondary | ICD-10-CM | POA: Diagnosis not present

## 2010-07-27 DIAGNOSIS — I70269 Atherosclerosis of native arteries of extremities with gangrene, unspecified extremity: Secondary | ICD-10-CM

## 2010-07-27 DIAGNOSIS — I4729 Other ventricular tachycardia: Secondary | ICD-10-CM | POA: Diagnosis not present

## 2010-07-27 DIAGNOSIS — D72829 Elevated white blood cell count, unspecified: Secondary | ICD-10-CM | POA: Diagnosis present

## 2010-07-27 DIAGNOSIS — L988 Other specified disorders of the skin and subcutaneous tissue: Secondary | ICD-10-CM | POA: Diagnosis present

## 2010-07-27 DIAGNOSIS — I10 Essential (primary) hypertension: Secondary | ICD-10-CM | POA: Diagnosis present

## 2010-07-27 DIAGNOSIS — Z7982 Long term (current) use of aspirin: Secondary | ICD-10-CM

## 2010-07-27 DIAGNOSIS — Z87891 Personal history of nicotine dependence: Secondary | ICD-10-CM

## 2010-07-27 DIAGNOSIS — E669 Obesity, unspecified: Secondary | ICD-10-CM | POA: Diagnosis present

## 2010-07-27 DIAGNOSIS — Z794 Long term (current) use of insulin: Secondary | ICD-10-CM

## 2010-07-27 DIAGNOSIS — L97509 Non-pressure chronic ulcer of other part of unspecified foot with unspecified severity: Secondary | ICD-10-CM | POA: Diagnosis present

## 2010-07-27 DIAGNOSIS — R32 Unspecified urinary incontinence: Secondary | ICD-10-CM | POA: Diagnosis present

## 2010-07-27 LAB — CBC
Hemoglobin: 9.7 g/dL — ABNORMAL LOW (ref 12.0–15.0)
MCH: 26.3 pg (ref 26.0–34.0)
RBC: 3.69 MIL/uL — ABNORMAL LOW (ref 3.87–5.11)
WBC: 13.1 10*3/uL — ABNORMAL HIGH (ref 4.0–10.5)

## 2010-07-27 LAB — BASIC METABOLIC PANEL
BUN: 8 mg/dL (ref 6–23)
CO2: 23 mEq/L (ref 19–32)
Calcium: 8.5 mg/dL (ref 8.4–10.5)
Creatinine, Ser: 0.38 mg/dL — ABNORMAL LOW (ref 0.4–1.2)
GFR calc non Af Amer: >60 mL/min (ref >60)
Glucose, Bld: 96 mg/dL (ref 70–99)

## 2010-07-27 LAB — GLUCOSE, CAPILLARY: Glucose-Capillary: 132 mg/dL — ABNORMAL HIGH (ref 70–99)

## 2010-07-27 LAB — SAMPLE TO BLOOD BANK

## 2010-07-28 LAB — CBC
MCH: 25.8 pg — ABNORMAL LOW (ref 26.0–34.0)
MCHC: 31.3 g/dL (ref 30.0–36.0)
MCV: 82.6 fL (ref 78.0–100.0)
MCV: 82.6 fL (ref 78.0–100.0)
Platelets: 421 10*3/uL — ABNORMAL HIGH (ref 150–400)
Platelets: 427 10*3/uL — ABNORMAL HIGH (ref 150–400)
RBC: 3.68 MIL/uL — ABNORMAL LOW (ref 3.87–5.11)
RBC: 3.96 MIL/uL (ref 3.87–5.11)
RDW: 16.3 % — ABNORMAL HIGH (ref 11.5–15.5)
WBC: 15.5 10*3/uL — ABNORMAL HIGH (ref 4.0–10.5)

## 2010-07-28 LAB — BASIC METABOLIC PANEL
BUN: 5 mg/dL — ABNORMAL LOW (ref 6–23)
Chloride: 105 mEq/L (ref 96–112)
GFR calc non Af Amer: >60 mL/min (ref >60)
Potassium: 3.8 mEq/L (ref 3.5–5.1)
Sodium: 133 mEq/L — ABNORMAL LOW (ref 135–145)

## 2010-07-28 LAB — GLUCOSE, CAPILLARY
Glucose-Capillary: 102 mg/dL — ABNORMAL HIGH (ref 70–99)
Glucose-Capillary: 102 mg/dL — ABNORMAL HIGH (ref 70–99)
Glucose-Capillary: 126 mg/dL — ABNORMAL HIGH (ref 70–99)
Glucose-Capillary: 160 mg/dL — ABNORMAL HIGH (ref 70–99)

## 2010-07-29 LAB — WOUND CULTURE

## 2010-07-29 LAB — GLUCOSE, CAPILLARY
Glucose-Capillary: 119 mg/dL — ABNORMAL HIGH (ref 70–99)
Glucose-Capillary: 143 mg/dL — ABNORMAL HIGH (ref 70–99)

## 2010-07-30 LAB — CROSSMATCH
ABO/RH(D): O POS
Antibody Screen: NEGATIVE
Unit division: 0
Unit division: 0

## 2010-07-30 LAB — GLUCOSE, CAPILLARY
Glucose-Capillary: 134 mg/dL — ABNORMAL HIGH (ref 70–99)
Glucose-Capillary: 151 mg/dL — ABNORMAL HIGH (ref 70–99)

## 2010-07-31 ENCOUNTER — Ambulatory Visit: Payer: MEDICARE | Admitting: Surgery

## 2010-07-31 LAB — GLUCOSE, CAPILLARY: Glucose-Capillary: 101 mg/dL — ABNORMAL HIGH (ref 70–99)

## 2010-08-01 LAB — GLUCOSE, CAPILLARY: Glucose-Capillary: 121 mg/dL — ABNORMAL HIGH (ref 70–99)

## 2010-08-02 LAB — CBC
Hemoglobin: 8.2 g/dL — ABNORMAL LOW (ref 12.0–15.0)
MCH: 25.4 pg — ABNORMAL LOW (ref 26.0–34.0)
MCHC: 31.3 g/dL (ref 30.0–36.0)
Platelets: 383 10*3/uL (ref 150–400)
RBC: 3.23 MIL/uL — ABNORMAL LOW (ref 3.87–5.11)

## 2010-08-02 LAB — BASIC METABOLIC PANEL
Calcium: 8.1 mg/dL — ABNORMAL LOW (ref 8.4–10.5)
Chloride: 101 mEq/L (ref 96–112)
Creatinine, Ser: 0.41 mg/dL (ref 0.4–1.2)
GFR calc Af Amer: >60 mL/min (ref >60)
Sodium: 133 mEq/L — ABNORMAL LOW (ref 135–145)

## 2010-08-02 LAB — GLUCOSE, CAPILLARY
Glucose-Capillary: 116 mg/dL — ABNORMAL HIGH (ref 70–99)
Glucose-Capillary: 112 mg/dL — ABNORMAL HIGH (ref 70–99)
Glucose-Capillary: 99 mg/dL (ref 70–99)

## 2010-08-03 LAB — GLUCOSE, CAPILLARY
Glucose-Capillary: 123 mg/dL — ABNORMAL HIGH (ref 70–99)
Glucose-Capillary: 127 mg/dL — ABNORMAL HIGH (ref 70–99)

## 2010-08-04 LAB — GLUCOSE, CAPILLARY
Glucose-Capillary: 123 mg/dL — ABNORMAL HIGH (ref 70–99)
Glucose-Capillary: 139 mg/dL — ABNORMAL HIGH (ref 70–99)

## 2010-08-04 LAB — CBC
MCH: 26.2 pg (ref 26.0–34.0)
MCHC: 32 g/dL (ref 30.0–36.0)
MCV: 81.9 fL (ref 78.0–100.0)
Platelets: 356 10*3/uL (ref 150–400)
RDW: 16.3 % — ABNORMAL HIGH (ref 11.5–15.5)

## 2010-08-05 LAB — CBC
HCT: 31.3 % — ABNORMAL LOW (ref 36.0–46.0)
MCHC: 33.2 g/dL (ref 30.0–36.0)
MCV: 81.1 fL (ref 78.0–100.0)
Platelets: 295 10*3/uL (ref 150–400)
RDW: 15.4 % (ref 11.5–15.5)

## 2010-08-05 LAB — CROSSMATCH: Unit division: 0

## 2010-08-05 LAB — BASIC METABOLIC PANEL
BUN: 13 mg/dL (ref 6–23)
Calcium: 8 mg/dL — ABNORMAL LOW (ref 8.4–10.5)
GFR calc non Af Amer: >60 mL/min (ref >60)
Glucose, Bld: 103 mg/dL — ABNORMAL HIGH (ref 70–99)
Sodium: 134 mEq/L — ABNORMAL LOW (ref 135–145)

## 2010-08-05 LAB — GLUCOSE, CAPILLARY: Glucose-Capillary: 114 mg/dL — ABNORMAL HIGH (ref 70–99)

## 2010-08-06 LAB — CBC
HCT: 31.5 % — ABNORMAL LOW (ref 36.0–46.0)
MCV: 82.5 fL (ref 78.0–100.0)
Platelets: 341 10*3/uL (ref 150–400)
RBC: 3.82 MIL/uL — ABNORMAL LOW (ref 3.87–5.11)
RDW: 15.7 % — ABNORMAL HIGH (ref 11.5–15.5)
WBC: 14.2 10*3/uL — ABNORMAL HIGH (ref 4.0–10.5)
WBC: 14.1 10*3/uL — ABNORMAL HIGH (ref 4.0–10.5)

## 2010-08-06 LAB — GLUCOSE, CAPILLARY
Glucose-Capillary: 93 mg/dL (ref 70–99)
Glucose-Capillary: 141 mg/dL — ABNORMAL HIGH (ref 70–99)

## 2010-08-06 LAB — BASIC METABOLIC PANEL
BUN: 12 mg/dL (ref 6–23)
GFR calc Af Amer: >60 mL/min (ref >60)
GFR calc non Af Amer: >60 mL/min (ref >60)
Potassium: 3.9 mEq/L (ref 3.5–5.1)
Sodium: 135 mEq/L (ref 135–145)

## 2010-08-06 LAB — MAGNESIUM: Magnesium: 2.1 mg/dL (ref 1.5–2.5)

## 2010-08-07 LAB — GLUCOSE, CAPILLARY
Glucose-Capillary: 110 mg/dL — ABNORMAL HIGH (ref 70–99)
Glucose-Capillary: 122 mg/dL — ABNORMAL HIGH (ref 70–99)

## 2010-08-07 LAB — CARDIAC PANEL(CRET KIN+CKTOT+MB+TROPI)
Relative Index: INVALID (ref 0.0–2.5)
Troponin I: 0.02 ng/mL (ref 0.00–0.06)

## 2010-08-07 LAB — CBC
MCH: 27.7 pg (ref 26.0–34.0)
Platelets: 330 10*3/uL (ref 150–400)
RBC: 4.05 MIL/uL (ref 3.87–5.11)
WBC: 14.2 10*3/uL — ABNORMAL HIGH (ref 4.0–10.5)

## 2010-08-07 LAB — PROTIME-INR
INR: 1.03 (ref 0.00–1.49)
Prothrombin Time: 13.7 seconds (ref 11.6–15.2)

## 2010-08-07 LAB — BASIC METABOLIC PANEL
Chloride: 105 mEq/L (ref 96–112)
Creatinine, Ser: 0.48 mg/dL (ref 0.4–1.2)
GFR calc Af Amer: >60 mL/min (ref >60)

## 2010-08-08 HISTORY — PX: PERMANENT PACEMAKER INSERTION: SHX6023

## 2010-08-08 LAB — GLUCOSE, CAPILLARY
Glucose-Capillary: 122 mg/dL — ABNORMAL HIGH (ref 70–99)
Glucose-Capillary: 106 mg/dL — ABNORMAL HIGH (ref 70–99)
Glucose-Capillary: 134 mg/dL — ABNORMAL HIGH (ref 70–99)

## 2010-08-09 ENCOUNTER — Inpatient Hospital Stay (HOSPITAL_COMMUNITY): Payer: Medicare Other

## 2010-08-09 LAB — CBC
HCT: 29.7 % — ABNORMAL LOW (ref 36.0–46.0)
Hemoglobin: 10 g/dL — ABNORMAL LOW (ref 12.0–15.0)
MCH: 27.7 pg (ref 26.0–34.0)
MCHC: 33.7 g/dL (ref 30.0–36.0)
MCV: 82.3 fL (ref 78.0–100.0)
RDW: 15.6 % — ABNORMAL HIGH (ref 11.5–15.5)

## 2010-08-09 LAB — BASIC METABOLIC PANEL
BUN: 13 mg/dL (ref 6–23)
CO2: 23 mEq/L (ref 19–32)
Calcium: 8.6 mg/dL (ref 8.4–10.5)
Creatinine, Ser: 0.39 mg/dL — ABNORMAL LOW (ref 0.4–1.2)
GFR calc non Af Amer: >60 mL/min (ref >60)
Glucose, Bld: 107 mg/dL — ABNORMAL HIGH (ref 70–99)
Sodium: 139 mEq/L (ref 135–145)

## 2010-08-09 LAB — GLUCOSE, CAPILLARY
Glucose-Capillary: 97 mg/dL (ref 70–99)
Glucose-Capillary: 132 mg/dL — ABNORMAL HIGH (ref 70–99)

## 2010-08-10 ENCOUNTER — Inpatient Hospital Stay (HOSPITAL_COMMUNITY): Payer: Medicare Other

## 2010-08-10 LAB — GLUCOSE, CAPILLARY: Glucose-Capillary: 106 mg/dL — ABNORMAL HIGH (ref 70–99)

## 2010-08-11 LAB — GLUCOSE, CAPILLARY
Glucose-Capillary: 118 mg/dL — ABNORMAL HIGH (ref 70–99)
Glucose-Capillary: 97 mg/dL (ref 70–99)

## 2010-08-13 NOTE — Op Note (Signed)
NAMEAVARI, NEVARES              ACCOUNT NO.:  [id number]  MEDICAL RECORD NO.:  [id number]           PATIENT TYPE:  I  LOCATION:  2007                         FACILITY:  [facility]  PHYSICIAN:  Thurmon Fair, MD     DATE OF BIRTH:  November 13, 1945  DATE OF PROCEDURE:  08/09/2010 DATE OF DISCHARGE:                              OPERATIVE REPORT   PROCEDURES PERFORMED: 1. Implantation of new dual-chamber permanent pacemaker. 2. Fluoroscopy.  REASON FOR THE PROCEDURE:  Intermittent high-grade second-degree atrioventricular block Mobitz type 2; necessary medications for nonsustained ventricular tachycardia with subsequent bradyarrhythmia.  Ms. Machnik is a critically ill 65 year old woman with advanced peripheral vascular disease status post recent leg amputation.  During the postoperative period, she has manifested repeated episodes of high- grade second-degree atrioventricular block with a transient AV dissociation.  She has also had frequent episodes of nonsustained ventricular tachycardia.  Treatment with beta-blockers have been limited due to bradyarrhythmias.  Consent was obtained from the patient and her family after risks and benefits of procedure described.  Ms. Pendry was brought to the cardiac cath lab in a fasting state and prepped and draped in usual sterile fashion.  1% lidocaine was administered to the prepectoral area inferior to the left clavicle.  A 5- cm horizontal incision was made parallel with the border of the left clavicle approximately 3-4 cm inferior to it.  A prepectoral pocket was created using electrocautery and blunt dissection.  The pocket was carefully inspected for hemostasis after which antibiotic-soaked sponge was placed in the pocket.  Under fluoroscopic guidance and using the modified Seldinger technique, two separate venipunctures were made in the left infraclavicular vein. Two separate J-tipped guidewires were then exchanged for two  7-French safe sheaths.  Under fluoroscopic guidance, the left ventricular lead was advanced to the level of the apical septum.  Satisfactory sensing and pacing was noted.  The active fixation helix was deployed.  Prominent current of injury was noted.  There was no evidence of diaphragmatic/phrenic nerve stimulation at maximum device output.  Sensing and pacing were good. The safe sheath was peeled away and the lead was secured to the pectoralis fascia using 2-0 silk.  In a similar fashion, under fluoroscopic guidance the atrial lead was advanced to level of the right atrial appendage.  The active fixation helix was deployed.  Pacing at maximum output did not produce diaphragmatic fascia phrenic nerve stimulation.  Prominent current of injury was seen.  Satisfactory sensing and pacing were noted.  Safe sheath was peeled away and the lead was secured to the pectoralis fascia using 2-0 silk.  The pocket was then flushed with copious amounts of antibiotic solution and reinspected for hemostasis.  The generator was then connected to the atrial and ventricular leads and appropriate atrial and ventricular sequential pacing was seen.  The device was subsequently monitored with radiofrequency monitoring throughout the remainder of the procedure.  Fluoroscopy showed satisfactory slack in both leads.  Due to the perceived increased risk of infection due to the patient's comorbidities, an Aigis antibiotic pouch was placed around the generator.  The pouch was then placed in the pocket making  sure that it had a comfortable position and also making sure that the leads were located deep to the generator.  The pocket was then closed in layers using two layers of 2-0 Vicryl and one layer of cutaneous staples.  A sterile dressing was applied and no immediate complications occurred.  MEDICATIONS ADMINISTERED DURING THE PROCEDURE:  Vancomycin 1 g intravenously, lidocaine 1% 30 mL locally, Versed 1  mg intravenously, and fentanyl 25 mcg intravenously.  No immediate complications.  ESTIMATED BLOOD LOSS:  Less than 5 milliliters.  DEVICE DETAILS:  The generator is a Architectural technologist RF device, model number [id number], serial number [id number].  The atrial lead is a Designer, jewellery 2088 TC-46, serial number [id number].  The ventricular lead is a Designer, jewellery 2088 TC-52, serial number [id number].  Atrial lead parameters show threshold 0.6 volts at 0.5 milliseconds pulse width, sensed P-waves of 3.8 mV, impedance of 486 ohms and current of 1.2 mA.  Right ventricular lead parameters show a threshold 0.4 volts at 0.5 milliseconds pulse width, sensed R-waves of 12.3 mV, impedance of 731 ohms, and current of 0.6 mA.     Thurmon Fair, MD     MC/MEDQ  D:  08/09/2010  T:  08/10/2010  Job:  [id number]  cc:   Jorge Ny, MD  Electronically Signed by Thurmon Fair M.D. on 08/13/2010 12:16:58 PM

## 2010-08-15 ENCOUNTER — Encounter (HOSPITAL_BASED_OUTPATIENT_CLINIC_OR_DEPARTMENT_OTHER): Payer: Medicare Other

## 2010-08-19 NOTE — Op Note (Signed)
  NAMETOMISHA, REPPUCCI              ACCOUNT NO.:  [id number]  MEDICAL RECORD NO.:  [id number]           PATIENT TYPE:  I  LOCATION:  2007                         FACILITY:  [facility]  PHYSICIAN:  Ritta Slot, MD     DATE OF BIRTH:  1945-05-18  DATE OF PROCEDURE:  07/27/2010 DATE OF DISCHARGE:                              OPERATIVE REPORT   This is an atrial and ventricular lead revision.  INDICATIONS:  [name] is a very pleasant 65 year old female who underwent pacemaker implantation by Dr. Royann Shivers yesterday.  Overnight, it was found that she had dislodgement of A and V leads and therefore she was brought to the cath lab for revision of her A and V leads.  DESCRIPTION OF PROCEDURE:  After informed consent, the left shoulder was prepped and draped in the usual sterile fashion.  He received 125 mcg of fentanyl, 5 mg of Versed IV for light sedation.  She was infiltrated with 30 mL of 1% lidocaine for local anesthesia.  Incision was made over the prior incision site yesterday and blunt dissection was performed down to the pocket.  The generator was removed and connected to the lead.  The atrial and ventricular leads were then repositioned under fluoro.  Measurements of the atrial lead was found to have P-wave 3.0, impendence 338, threshold 0.8 at 0.4, no diaphragmatic stimulation at 10 volts.  Ventricular lead was positioned to have R-waves of 12.2, impedance 691, threshold of 0.8 at 0.4 with no diaphragmatic stimulation at 10 volts.  The atrial and ventricular leads were then sutured down to the pocket with 0 Vicryl x2.  The pocket was irrigated with 1% kanamycin solution.  The generator was connected back to the atrial and ventricular leads.  It was placed in an AIGIS pocket, lot #[id number]. This was then delivered through the pocket and sutured with 0 silk.  The pocket was then closed with 2-0 Vicryl deep and 4-0 Vicryl skin.  FINAL CONCLUSION:  Successful repositioning of  atrial and ventricular leads.  She was taken back to the recovery room for further recovery.     Ritta Slot, MD     HS/MEDQ  D:  08/09/2010  T:  08/10/2010  Job:  [id number]  Electronically Signed by Ritta Slot MD on 08/19/2010 04:04:11 PM

## 2010-08-21 NOTE — Consult Note (Signed)
[name], [name]              ACCOUNT NO.:  [id number]  MEDICAL RECORD NO.:  [id number]           PATIENT TYPE:  I  LOCATION:  2007                         FACILITY:  [facility]  PHYSICIAN:  Lennie Muckle, MD      DATE OF BIRTH:  Feb 22, 1946  DATE OF CONSULTATION: DATE OF DISCHARGE:                                CONSULTATION   REQUESTING PHYSICIAN: 1. Durene Cal IV, MD  REASON FOR CONSULTATION:  Right abdominal wall wound.  Ms. Profeta is a 65 year old female who was apparently admitted for above-the-knee amputation of the right leg by Dr. Myra Gianotti on the 12th. She apparently had been followed for ischemic disease, was unable to salvage the leg.  She underwent her procedure on the 12th.  Apparently there was a wound on her right abdominal wall.  She was seen by wound care who felt that the area needed to be debrided by surgery.  She felt that there was 80% slough and there was eschar over the vicinity which could not be just managed with wound care.  They also felt she probably would get some hydrotherapy benefit.  She had another lesion on her buttock, inner thigh.  Her history is remarkable for several strokes and vascular procedures. She cannot tell me why she has an open wound on her abdominal wall.  Her white count has slightly gone up from 13-15 today.  She has not had fevers for the past 24 hours.  She does suffer from diabetes.  Her past medical history is diabetes, hypertension, cerebrovascular accidents, neuropathy, peripheral vascular disease, dysphagia, anemia and hyperlipidemia.  SURGICAL HISTORY:  She had in the past multiple arteriograms of her carotids, aborted stent placement, recently had her right AKA on the 12th, unable to obtain any other history.  She does have a history of a cholecystectomy in the past.  I have gathered most of the information from her chart due to the patient's inability to communicate most of her history.  CURRENT MEDICATIONS:   At home include aspirin, Lasix, Pyridium, Ditropan, hydralazine, enalapril, labetalol, Crestor, lisinopril. Nicotine, amlodipine, iron, Nystatin and Periostat.  During this hospitalization, she is on Lasix, Flonase, aspirin, Norvasc, Norpramin, Crestor, Normodyne, Vasotec, vitamin D, Plavix, nystatin, iron, Protonix, Lovenox, Colace, Apresoline as needed, insulin sliding scale, Zosyn, vancomycin, oxycodone.  ALLERGIES:  IV DYE.  REVIEW OF SYSTEMS:  As per the chart, nothing acutely of the patient.  PHYSICAL EXAMINATION:  GENERAL:  She is lying in her bed, is in no acute distress.  She seems somewhat anxious, begins rocking herself during the consultation.  She is obese, elderly black female, appears anxious. VITAL SIGNS:  T-max is 99.2, temperature is 97.8, blood pressure 160/73. HEENT:  Extraocular muscles are intact.  Pupils are equal and round. Sclerae and conjunctivae are clear.  Nares are clear without drainage. Oral mucosa is moist.  Dentition is fair. NECK:  No tenderness.  No thyroid palpable abnormalities.  The trachea is midline. CHEST:  Diminished at the bases.  She has good expansion. CARDIOVASCULAR:  Regular rate and rhythm.  Occasional PVC.  Pulses palpated in upper extremity. MUSCULOSKELETAL:  She has  obvious amputation on the right lower extremity.  The staple line is intact.  No deformity on the left lower extremity.  No deformities in upper extremity. SKIN:  Skin is warm to the touch.  Staples intact on the right amputation.  There is a open wound on the abdominal wall measuring approximately 15 x 12.  Adipose tissue is evident.  I do not see any really purulent drainage.  No foul-smelling discharge is seen.  There is skin change, some mild inflammatory changes and darkening around the open wound. ABDOMEN:  Obese, she is somewhat tender in the right side and around the wound. PSYCHOLOGIC:  Anxious. NEUROLOGIC:  She seems slightly confused.  Answers some  questions.  ASSESSMENT AND PLAN:  Right lower quadrant open abdominal wall wound. Unknown reason for her wound.  Currently, I do not think there is anything on the wound that needs debridement surgically.  We would continue with dressing changes twice daily with moist gauze.  Agrees probable benefit of hydrotherapy as it had been done today.  We will continue to follow for any changes that required surgical debridement.     Lennie Muckle, MD     ALA/MEDQ  D:  07/29/2010  T:  07/30/2010  Job:  [id number]  Electronically Signed by Bertram Savin MD on 08/21/2010 10:49:18 AM

## 2010-08-28 ENCOUNTER — Ambulatory Visit (INDEPENDENT_AMBULATORY_CARE_PROVIDER_SITE_OTHER): Payer: Medicare Other | Admitting: Surgery

## 2010-08-28 DIAGNOSIS — I739 Peripheral vascular disease, unspecified: Secondary | ICD-10-CM

## 2010-08-28 NOTE — Discharge Summary (Signed)
  NAME:  [name], [name]              ACCOUNT NO.:  [id number]  MEDICAL RECORD NO.:  [id number]           PATIENT TYPE:  I  LOCATION:  2007                         FACILITY:  [facility]  PHYSICIAN:  Juleen China IV, MDDATE OF BIRTH:  May 27, 1945  DATE OF ADMISSION:  07/27/2010 DATE OF DISCHARGE:  08/10/2010                        DISCHARGE SUMMARY - REFERRING   ADDENDUM:  Originally, the patient was to be discharged on August 01, 2010.  However, she did have an acute on chronic anemia with hemoglobin of 7.8 and did receive packed red blood cells.  She also had leukocytosis with a white count of 15.5 that was likely related to her abdominal wound.  On postop day #10, she did have some rhythm changes and had third-degree heart block.  At that time, a cardiology consult was obtained.  She then was taken for a permanent pacemaker on August 08, 2010.  She has done well with this.  Her pacemaker was interrogated. Her right above the knee amputation continues to heal nicely with minimal incisional erythema and her abdominal wound is improving per Wound Care.  DISPOSITION:  She is therefore discharged to skilled nursing facility today.  MOST RECENT LABS:  White blood cell count 10.6, hemoglobin 10, hematocrit 29.7, platelet count 314.  Potassium 3.5, sodium 139, BUN 13, creatinine 0.39, glucose 107.  DISCHARGE DIAGNOSES: 1. Third-degree heart block.     a.     Status post permanent pacemaker placement August 09, 2010. 2. Acute on chronic anemia requiring packed red blood cells. 3. Ischemic right leg.     a.     Status post right above the knee amputation July 27, 2010. 4. Right lower quadrant abdominal wound.     a.     Receiving hydrotherapy daily. 5. Diabetes. 6. Hypertension. 7. History of cerebrovascular accident. 8. Neuropathy. 9. Dysphagia. 10.Hyperlipidemia.  ALLERGIES: 1. IV CONTRAST. 2. PENICILLIN.  DISCHARGE MEDICATIONS:  Continued to be the same.  DISCHARGE  INSTRUCTIONS:  Are the same.  FOLLOW UP: 1. The patient will follow up with Dr. Myra Gianotti in 3 weeks. 2. The patient is to follow up with Lynnea Ferrier at [place] on August 14, 2010 at 4:30 p.m.    ______________________________ Newton Pigg, PA   ______________________________ V. Charlena Cross, MD    SE/MEDQ  D:  08/10/2010  T:  08/10/2010  Job:  [id number]  cc:   Jorge Ny, MD Lennie Muckle, MD Georgann Housekeeper, MD  Electronically Signed by Arelia Longest IV MD on 08/28/2010 10:56:14 PM

## 2010-08-28 NOTE — Discharge Summary (Signed)
  NAME:  [name], [name]              ACCOUNT NO.:  [id number]  MEDICAL RECORD NO.:  [id number]           PATIENT TYPE:  I  LOCATION:  2007                         FACILITY:  [facility]  PHYSICIAN:  Juleen China IV, MDDATE OF BIRTH:  [birth year]  DATE OF ADMISSION:  07/27/2010 DATE OF DISCHARGE:                        DISCHARGE SUMMARY - REFERRING   ADDENDUM  The additional addendum is for discharge instructions:  The patient is not to raise her left arm for 1 week until cleared by Dr. Lynnea Ferrier.    ______________________________ Newton Pigg, PA   ______________________________ V. Charlena Cross, MD    SE/MEDQ  D:  08/10/2010  T:  08/10/2010  Job:  [id number]  cc:   Jorge Ny, MD Lennie Muckle, MD Georgann Housekeeper, MD  Electronically Signed by Arelia Longest IV MD on 08/28/2010 10:56:17 PM

## 2010-08-28 NOTE — Discharge Summary (Signed)
NAME:  YEIRA, GULDEN              ACCOUNT NO.:  [id number]  MEDICAL RECORD NO.:  [id number]           PATIENT TYPE:  I  LOCATION:  2007                         FACILITY:  [facility]  PHYSICIAN:  Juleen China IV, MDDATE OF BIRTH:  19-Jan-1946  DATE OF ADMISSION:  07/27/2010 DATE OF DISCHARGE:  08/01/2010                        DISCHARGE SUMMARY - REFERRING   ADMISSION DIAGNOSIS:  Ischemic right leg.  BRIEF HISTORY OF PRESENT ILLNESS:  This is a 65 year old female who has developed ulceration of her right knee and right foot.  These are not salvagable limbs and she has severe tissue loss down to the bone.  This was discussed with the family in the office by Dr. Myra Gianotti.  She has also seen Dr. Imogene Burn and this limb is not salvagable.  Due to the extensive tissue loss, he thinks for her to avoid life threatening infections.  She needs an above-the-knee amputation and it seems family was in agreement with for this. HOSPITAL COURSE:  The patient was admitted to the hospital and  taken to the operating room in July 27, 2010, where she underwent right above- the-knee amputation.  She tolerated this well and was returned to the recovery room in satisfactory condition.  Upon her admission, she also had a right abdominal wound at which time a wound care consult was obtained and they felt that at that time the wound would need to be debrided by surgery.  There was 80% slough and there was eschar over the vicinity which cannot be just managed with the wound care.  They also felt she probably would get some hydrotherapy benefit.  She also had other lesions on her buttock and inner thigh.  At that time, General Surgery did not think anything on the wound, needed debriding at that time.  Dressing changes were continued twice daily with moist gauze and agreed with hydrotherapy.  During her hospitalization, she has also been put on antibiotics and will continue to use at the skilled nursing facility  when she is discharged.  Her AKA incision site has continued to heal nicely.  Her abdominal wound is also progressing with decreased drainage and drainage is now minimal with no odor.  DISCHARGE LABS:  White blood cell count is 13.7, hemoglobin is 8.2, hematocrit 26.2, platelets 383.  Sodium 133, potassium 3.8.  BUN 5, creatinine 0.41, and glucose 110.  DISCHARGE DIAGNOSES: 1. Ischemic right leg.     a.     Status post right above-the-knee amputation on July 27, 2010. 2. Right lower quadrant abdominal wound.     a.     Receiving hydrotherapy daily. 3. Diabetes. 4. Hypertension. 5. History of cerebrovascular accident. 6. Neuropathy. 7. Dysphagia. 8. Anemia. 9. Hyperlipidemia.  ALLERGIES:  IV CONTRAST AND PENICILLIN.  DISCHARGE MEDICATIONS: 1. Iron sulfate 325 mg p.o. daily. 2. Levaquin 500 mg p.o. daily x4 days. 3. Hydrocodone 5/500 one to two tablets p.r.n. every 6 hours. 4. Amlodipine 10 mg p.o. daily. 5. Aspirin 325 mg p.o. daily. 6. Acetaminophen 325 mg 2 tablets every 6 hours p.r.n.7. Albuterol 2.5 mg/3 mL nebulizer solution every 3 hours p.r.n.  shortness of breath. 8. Artificial tears 1 drop every 4 hours p.r.n. 9. Crestor 20 mg p.o. at bedtime. 10.Desipramine 25 mg one tablet p.o. at bedtime. 11.Ditropan XL 5 mg p.o. daily. 12.Enalapril 10 mg p.o. b.i.d. 13.Flonase 2 sprays daily. 14.Hydralazine 50 mg one tablet p.o. q.i.d. 15.Insulin sliding scale for blood glucose of 101-150, give 1 unit;     glucose 151-200, give 2 units; glucose 201-250, give 3 units;     glucose 251-300, give 5 units; glucose 301-350, give 7 units; and     greater than 350, give 9 units t.i.d. with meals. 16.Labetalol 300 mg half a tablet every 8 hours. 17.Lasix 40 mg p.o. daily. 18.Milk of magnesia 30 mL b.i.d. p.r.n. 19.Nystatin 1000 units/g b.i.d. to affected area. 20.Peridex oral rinse 10 cc q.i.d. 21.Plavix 75 mg p.o. daily. 22.Prilosec 20 mg daily. 23.Prostat liquid 30 mL  at bedtime. 24.Vitamin D2 50,000 units 1 capsule by mouth every Friday.  DISCHARGE INSTRUCTIONS:  The patient is discharged to Skilled Nursing Facility with the following wound care instructions: 1. Continue hydrotherapy on abdominal wound daily. 2. Apply Santyl to abdominal wound daily and cover with dry dressing     for the left heel at all times.  FOLLOW-UP:  Patient is to follow up with Dr. Myra Gianotti in 3 weeks.    ______________________________ Newton Pigg, PA   ______________________________ V. Charlena Cross, MD    SE/MEDQ  D:  08/03/2010  T:  08/03/2010  Job:  [id number]  cc:   Jorge Ny, MD Lennie Muckle, MD Georgann Housekeeper, MD  Electronically Signed by Arelia Longest IV MD on 08/28/2010 10:56:11 PM

## 2010-08-28 NOTE — Op Note (Signed)
  NAME:  [name], [name]              ACCOUNT NO.:  [id number]  MEDICAL RECORD NO.:  [id number]           PATIENT TYPE:  I  LOCATION:  2007                         FACILITY:  [facility]  PHYSICIAN:  Juleen China IV, MDDATE OF BIRTH:  [birth year]  DATE OF PROCEDURE:  07/27/2010 DATE OF DISCHARGE:                              OPERATIVE REPORT   PREOPERATIVE DIAGNOSIS:  Ischemic right leg.  POSTOPERATIVE DIAGNOSIS:  Ischemic right leg.  PROCEDURE PERFORMED:  Right above-knee amputation.  ANESTHESIA:  General.  BLOOD LOSS:  75 mL.  COMPLICATIONS:  None.  SURGEON: 1. Charlena Cross, MD  ASSISTANT:  Pecola Leisure, PA  ANESTHESIA:  General.  SPECIMEN:  Right leg.  INDICATIONS:  [name] is a 65 year old female who has developed ulceration on her right knee and right foot.  These are not salvageable wounds as she has severe tissue loss down to the bone.  I discussed this with the family in the office.  She also seen Dr. Imogene Burn, this is not a limb that is salvageable.  Due to the extensive tissue loss I think for to avoid life-threatening infection, she needs an above-knee amputation, the family is in agreement with this.  PROCEDURE:  The patient was identified in the holding area and taken to the operating room.  She was placed supine on the table.  General endotracheal anesthesia was administered.  The patient was prepped and draped in the usual fashion.  A time-out was called.  A fishmouth incision was made above the knee on the right leg.  Cautery was used to divide the subcutaneous tissue.  The patient had dense subcutaneous fat with very minimal amount of muscle all of which was viable, the muscle was divided with cautery until I got down to the femur.  The femur was circumferentially dissected free.  Periosteal elevator was used to elevate the periosteum.  A Gigli saw was then used to transect the femur.  The anterior edge was beveled.  I then proceeded with  cautery division of the remaining subcutaneous tissue and muscle.  The neurovascular bundle was isolated between hemostats and divided and then the leg was removed as a specimen.  I individually tied off the artery and vein together with a 0 silk tie.  This was done proximal to the cut edge of the femur.  I then dissected out the nerve and ligated the proximal to the cut edge of the femur.  A rasp was used to smooth out the edges of the femur.  The wound was then copiously irrigated.  Once I was satisfied with hemostasis, I reapproximated the fascia with interrupted 2-0 Vicryl suture, the skin was closed with staples.  Sterile dressings were applied.  There were no complications.     Jorge Ny, MD     VWB/MEDQ  D:  07/28/2010  T:  07/29/2010  Job:  [id number]  Electronically Signed by Arelia Longest IV MD on 08/28/2010 10:56:20 PM

## 2010-08-29 NOTE — Assessment & Plan Note (Signed)
OFFICE VISIT   [name], [name]  DOB:  [birth year]                                       03/21/2009  CHART#:[id number]   HISTORY:  This is a 65 year old female history of the left carotid  endarterectomy by Dr. Arbie Cookey in 2003.  This was done for symptomatic  disease.  She came to my office on 11/08 with arm numbness and tingling  on the right.  She had right leg numbness and tingling as well as some  arm numbness and tingling.  Ultrasound confirmed high-grade left carotid  stenosis.  She was scheduled urgently for carotid stent, and this was  performed on February 22, 2009 in conjunction with Dr. Allyson Sabal.  The  patient comes back in today for follow-up.  She has not been having any  symptoms, the limb numbness and weakness have gone.  She denies having  any amaurosis fugax or language or speech difficulty.  Her biggest  complaints have been swelling in her legs and some shortness of breath.  She is taking Lasix at home, but her shortness of breath is getting  worse.Marland Kitchen   PHYSICAL EXAMINATION:  Vital signs:  Her blood pressure is 197/66, heart  rate 62, respirations 28.  General:  She is well-appearing in no  distress, respirations are coarse bilaterally.  Cardiovascular:  Regular  rate and rhythm.  Abdomen is obese but soft.  Neurologically she is  intact.   ASSESSMENT/PLAN:  Status post left carotid stent, patient is doing well  from her carotid perspective.  She is seen by Victorino Dike today for  protocol reasons.  She is asymptomatic and I am going to send her to see  Dr. Allyson Sabal for evaluation of her cardiac status as she is dealing with  shortness of breath and lower extremity edema.  Otherwise she will be  placed on carotid stent protocol and we will see her back as needed.   Jorge Ny, MD  Electronically Signed   VWB/MEDQ  D:  03/21/2009  T:  03/22/2009  Job:  [id number]

## 2010-08-29 NOTE — Assessment & Plan Note (Signed)
OFFICE VISIT   [name], [name]  DOB:  [birth year]                                       02/21/2009  CHART#:[id number]   REASON FOR VISIT:  Carotid stenosis.   HISTORY:  This is a 65 year old female with a history of a left carotid  endarterectomy performed by Dr. Arbie Cookey in 2003.  At that time the patient  was having symptomatic disease.  Her main symptoms were right leg  numbness and tingling.  She did have some arm numbness and tingling.  She comes in today having recently had an ultrasound which revealed high-  grade left carotid stenosis.  The right side had minimal to moderate  disease.  On further questioning, the patient states that she has had  several episodes of right arm weakness and numbness.  She has even had  an episode where she fell, which implied that she may have had some  right leg numbness.  These episodes have lasted a short period of time  and then gone away.  She denies amaurosis fugax.  She denies speech or  language difficulty.   The patient has a history of coronary artery disease but never had a  heart attack.  She also suffers from hypertension and  hypercholesterolemia, both of which have been chronically stable  problems for her.  She has had some leg swelling, most prominent on the  left, which she treats with elevation.   REVIEW OF SYSTEMS:  Positive for shortness of breath when lying with  exertion, frequent urination, pain in her legs with walking and lying  flat, arthritis, joint pain, muscle pain.  All other review systems is  negative.   PAST MEDICAL HISTORY:  Extracranial carotid artery disease,  hypertension, hyperlipidemia, history of stroke in February 2003,  osteoarthritis, peripheral vascular occlusive disease.   PAST SURGICAL HISTORY:  A partial hysterectomy, cholecystectomy, right  breast biopsy, cardiac catheterization in December 2002, abdominal tumor  removal in 1979, left carotid endarterectomy in  2003.   ALLERGIES:  None.   MEDICATIONS:  Please see medical record.   PHYSICAL EXAMINATION:  Vital signs:  Heart 49, blood pressure is 159/69,  temperature is 98.0.  General:  Well-appearing, in no distress.  HEENT:  Normocephalic, atraumatic.  Pupils equal.  Sclerae anicteric.  Neck:  Supple.  No JVD.  No carotid bruits.  Cardiovascular:  Regular rate and  rhythm.  No murmurs, rubs or gallops.  Abdomen:  Soft, nontender.  Musculoskeletal:  No major deformities.  Neurologic:  She is no focal  deficits.  Skin is without rash.   DIAGNOSTIC STUDIES:  I have independently reviewed her carotid  ultrasound, and this reveals a high-grade left carotid stenosis greater  than 80%.   ASSESSMENT:  Symptomatic left carotid stenosis, status post left carotid  endarterectomy.   PLAN:  I told the patient that I am concerned that her right arm  symptoms are related to her left carotid stenosis.  I think we need to  address this immediately.  I have elected to proceed with bilateral a  carotid arteriogram to be performed tomorrow.  In the meantime she is  going to continue on her Plavix.  I did tell her to start taking a baby  aspirin today.  We discussed proceeding with a carotid stent versus  carotid endarterectomy based on the findings of her  arteriogram.  She  does have a history of contrast allergy, and so we will premedicate her  tomorrow.   Jorge Ny, MD  Electronically Signed   VWB/MEDQ  D:  02/21/2009  T:  02/22/2009  Job:  [id number]   cc:   Dr. Lutricia Horsfall R. Jacinto Halim, MD  Larina Earthly, M.D.

## 2010-08-29 NOTE — Assessment & Plan Note (Signed)
OFFICE VISIT  [name], [name] DOB:  1945-06-16                                       08/28/2010 CHART#:[id number]  Patient comes back in today for a followup.  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 Lynnea Ferrier.  She is here today for a 25-month followup.  She does not have any pain or rest pain in that leg.  The wound looks healthy.  The staples will be removed today.  There is an area in the mid portion which may separate once the staples are removed.  I do not see any evidence of infection.  If the area opens up, I would have it packed with wet-to-dry dressings with the intention of me seeing her back in 1 month.    Jorge Ny, MD Electronically Signed  VWB/MEDQ  D:  08/28/2010  T:  08/29/2010  Job:  [id number]

## 2010-08-29 NOTE — Procedures (Signed)
CAROTID DUPLEX EXAM   INDICATION:  Followup of left carotid disease.   HISTORY:  Diabetes:  no  Cardiac:  no  Hypertension:  yes  Smoking:  no  Previous Surgery:  no  CV History:  leg weakness  Amaurosis Fugax No, Paresthesias No, Hemiparesis Yes                                       RIGHT             LEFT  Brachial systolic pressure:  Brachial Doppler waveforms:  Vertebral direction of flow:                          Antegrade  DUPLEX VELOCITIES (cm/sec)  CCA peak systolic                                     80  ECA peak systolic                                     168  ICA peak systolic                                     596  ICA end diastolic                                     228  PLAQUE MORPHOLOGY:                                    Homogenous  PLAQUE AMOUNT:                                        Severe  PLAQUE LOCATION:                                      ICA   IMPRESSION:  80 to 99% stenosis noted in left internal carotid artery.   ___________________________________________  V. Charlena Cross, MD   CJ/MEDQ  D:  02/21/2009  T:  02/21/2009  Job:  [id number]

## 2010-09-01 NOTE — Cardiovascular Report (Signed)
[place]. [facility]  Patient:    [name], [name] Visit Number: [id number] MRN: [id number]          Service Type: MED Location: 2000 2005 01 Attending Physician:  Darlin Priestly Dictated by:   Lenise Herald, M.D. Proc. Date: 04/10/01 Admit Date:  04/10/2001   CC:         Delrae Rend, M.D.  Ernesto Rutherford, M.D.   Cardiac Catheterization  PROCEDURES PERFORMED: 1. Left heart catheterization. 2. Coronary angiography. 3. Left ventriculogram. 4. Abdominal aortogram.  ATTENDING:  Lenise Herald, M.D.  COMPLICATIONS:  None.  INDICATIONS:  Ms. Ladell Pier is a 65 year old black female, patient of Dr. Luciana Axe and Dr. Jeanella Cara, with a history of hypertension, hyperlipidemia, history of tobacco abuse, history of known 80% left carotid artery stenosis, who was undergoing Persantine Cardiolite as an outpatient today and developed substernal chest pain with inferior ST depression.  The patient is known to have LVH.  She was given sublingual nitroglycerin, as well as IV aminophylline and is now transferred for cardiac catheterization.  DESCRIPTION OF PROCEDURE:  After giving informed written consent, the patient was brought to the cardiac catheterization laboratory where her right and left groins were shaved, prepped, and draped in the usual sterile fashion.  ECG monitoring was established.  Using the modified Seldinger technique, a 6-French arterial sheath was inserted into the right femoral artery.  A 6-French diagnostic catheter was then used to perform diagnostic angiography.  There was a large left main with no significant disease.  The LAD was a large vessel which coursed the apex and gave rise to one diagonal branch.  The LAD had no significant disease.  The first diagonal was a medium sized vessel with no significant disease.  The left circumflex was a large vessel which was dominant.  It gave rise to three obtuse marginals, as well as a  PDA.  The AV groove circumflex was noted to have a 30% distal stenosis.  The first OM was a medium sized vessel with no significant disease.  The second OM was a medium sized vessel with 50% ostial lesion.  The third OM was a small vessel with no significant disease.  The fourth OM was a large vessel which runs as a PDA and had no significant disease.  The right coronary artery was a small nondominant vessel with no significant disease.  The left ventriculogram revealed observed ejection fraction of 65-70%.  There is known to be LVH.  The abdominal aortogram revealed no evidence of renal artery stenosis.  There was mild diffuse distal aortic disease.  There was 40% ostial and proximal left iliac disease.  There was diffuse disease of the right iliac with up to 80% stenosis in the mid segment.  There was diffuse disease of the internal iliacs bilaterally.  HEMODYNAMICS:  Systemic arterial pressure was 209/75, LV systemic pressure 209/18.  LV EDP 26.  CONCLUSIONS: 1. No significant coronary artery disease. 2. Normal left ventricular systolic function. 3. No evidence of renal artery stenosis. 4. Diffuse disease of the right iliac system, as described above. 5. Systemic hypertension. Dictated by:   Lenise Herald, M.D. Attending Physician:  Darlin Priestly DD:  04/10/01 TD:  04/10/01 Job: [id number]

## 2010-09-01 NOTE — Cardiovascular Report (Signed)
NAME:  [name], [name]                        ACCOUNT NO.:  [id number]   MEDICAL RECORD NO.:  [id number]                   PATIENT TYPE:  OIB   LOCATION:  2899                                 FACILITY:  [facility]   PHYSICIAN:  Cristy Hilts. Jacinto Halim, M.D.                  DATE OF BIRTH:  July 13, 1945   DATE OF PROCEDURE:  03/16/2002  DATE OF DISCHARGE:                              CARDIAC CATHETERIZATION   PROCEDURE:  1. Abdominal aortogram.  2. Abdominal aortogram with femoral runoff.   CARDIOLOGIST:  Cristy Hilts. Jacinto Halim, M.D.   INDICATION:  Abnormal Dopplers showing moderate to severe arterial  insufficiency on both lower extremities with ABI of 0.5 and severe lifestyle  claudication.   DATA:  Abdominal Aortogram:  Abdominal aortogram revealed mild abdominal  atherosclerosis.  There is no evidence of abdominal aortic aneurysm.  There  were two renal arteries, one on either side, and they are widely patent.   Right iliac artery:  The right iliac artery has mild to moderate diffuse  disease.  Just at the bifurcation of the internal iliac, there is a long  segment of 80% stenosis.  The iliac artery continues as external iliac  artery and eventually common femoral artery and superficial femoral artery.  The superficial femoral artery on the right has mild 30 to 40% stenosis with  luminal irregularity.  Below the knee, three-vessel excellent runoff is  noted.   Left iliac artery with femoral runoff:  The left iliac artery ostium shows  70% luminal narrowing.  There is mild diffuse disease in the external iliac  artery.  The external iliac artery continues as superficial femoral artery.  The superficial femoral artery has a very long (60 to 100 mm) superficial  femoral artery occlusion which has extensive collaterals with the profunda.  There is an excellent three-vessel runoff noted below the knee.   INTERVENTIONAL DATA:  Successful PTA and direct stenting of the left iliac  artery with an 8.0 x 24  mm Genesis on Opta balloon deployed at 10  atmospheres of pressure for 90 seconds.  The stenosis was reduced from 70%  to less than 10%.  Excellent flow was noted.   Right iliac artery:  Successful PTA and stenting of the right iliac artery.  Initially, the lesion was dilated with a balloon as it was a long segmental  stenosis with a 7.0 x 40 mm Opta balloon at 5 atmospheres of pressure.  The  patient complained of pain in right lower abdominal area.  Hence, higher  pressure was not utilized.  The balloon was deflated; hence, there was  residual 40 to 50% stenosis in the mid segment with mild haziness.   Successful PTA and stenting with an 8.0 x 40 mm Smart stent.  This stent was  post dilated with the same 7.0 x 40 mm balloon at 7 atmospheres of pressure  maximum.  Higher pressure not  utilized because of patient complaining of  pain.  The balloon was deflated, and the final angiographic results include  80% stenosis reduced to less than 20% with excellent flow with no evidence  of dissection and no evidence of thrombus at the end of the procedure.   RECOMMENDATIONS:  At this point, conclude aggressive medical therapy is  indicated.  Weight loss and aggressive control of hypertension is indicated.  She does have a long segment of left superficial femoral artery occlusion;  hence, exercise program is indicated.  If she continues to have severe pain,  an attempt could be made for percutaneous revascularization; however, this  is a very long segmental lesion.  Hence, medical therapy is probably  appropriate at this time unless she has continued lifestyle-limiting  claudication.  Also, her claudication was more on the right than on the  left.   TECHNIQUE OF PROCEDURE:   DIAGNOSTIC CATHETERIZATION:  Under usual sterile precautions, through a 5-  [place] left femoral artery access, a 5-French pigtail catheter was advanced  into the abdominal aorta over the 0.035 inch J wire. Abdominal  aortogram was  performed.  Then the catheter was pulled back in the lower abdominal aorta  and abdominal aortogram with femoral follow-through was performed.   TECHNIQUE OF INTERVENTION:  The 8.0 x 24 mm Genesis on Opta stent was  advanced over a 0.035 mm Wholey wire that was already put in place after  exchanging the 5-French sheath for a 7-French left femoral sheath.  Then,  after confirming the placement of the stent, the stent was deployed at 10  atmospheres of pressure for 90 seconds.  The balloon was deflated, pushed  into the abdominal aorta, and a second inflation at 12 atmospheres of  pressure for 10 seconds was performed.  Balloon was deflated and pulled  outside of the body, and angiography was performed.  Brisk flow was noted.   The right femoral arterial access was obtained, and a 7-French sheath was  introduced into the right femoral artery.  Arteriography was again  performed.  The lesion length was measured with the help of a measuring  tape.  Then a 7.0 x 40 mm Powerflex  balloon was advanced into the iliac  artery.  After confirming the position of the balloon, the balloon inflation  was performed at 5 atmospheres of pressure for 45 seconds, then 30 seconds.  Then the balloon was pulled back,  pulled outside of the body, and  angiography was performed.  There was residual 50% stenosis noted with  haziness.  Hence an 8.0 x 40 mm Smart stent was advanced over the same  [facility] wire on the right side.  After confirming the position of the stent,  the stent was deployed.  Angiography was performed.  Then the 7.0 x 40 mm  Powerflex balloon was advanced over the same [facility] wire, and two balloon  inflations of 5 atmospheres pressure for 40 seconds and 7 atmospheres of  pressure for 30 seconds were performed.  The balloon was deflated, pulled  out of the body, and angiography was performed.  The pigtail catheter was  again advanced into the abdominal aorta, and abdominal  aortogram and profunda follow-through were performed.  After noting excellent results, the  pigtail catheter was removed out of the body in the usual fashion.  Arterial  access sheaths were sutured in place, and the  patient was transferred to the recovery room in stable condition.  During  the procedure, initially 5000 units  of heparin and another additional 1000  units of heparin were given, and ACT was maintained at greater than 200.  The patient tolerated the procedure well.                                                  Cristy Hilts. Jacinto Halim, M.D.    Pilar Plate  D:  03/16/2002  T:  03/16/2002  Job:  [id number]   cc:   [facility]   Benetta Spar R. Rankins, M.D.  [address]  [place]  [place] [address]  Fax: [phone]

## 2010-09-01 NOTE — H&P (Signed)
[place]. [facility]  Patient:    KEGAN, SHEPARDSON Visit Number: [id number] MRN: [id number]          Service Type: SUR Location: [facility] 2899 44 Attending Physician:  Alyson Locket Dictated by:   Dominica Severin, P.A. Admit Date:  06/04/2001                           History and Physical  DATE OF BIRTH:  10-13-1945  PRIMARY CARE PHYSICIAN:  Dr. Barton Fanny.  CARDIOLOGIST:  Dr. Delrae Rend.  CHIEF COMPLAINT:  Left carotid artery disease.  HISTORY OF PRESENT ILLNESS:  This is a 65 year old African American female who is referred by Dr. Jacinto Halim for evaluation of carotid artery disease.  She is a symptomatic and morbidly obese female with right-sided numbness and leg numbness with mild weakness.  A lab study done on January 08, 2001 revealed a greater than 80% left internal carotid artery stenosis.  Patient was to undergo a CAT scan of the head on June 02, 2001.  The results of that at this time are not known.  Her main symptoms are right leg numbness and tingling.  She previously have some arm numbness and tingling although that has subsided.  She denies any headache, nausea, vomiting, vertigo or dizziness, history of fall, seizure, muscle weakness, speech impairment, dysphagia, vision changes, syncope, presyncope, memory loss or confusion.  She does state she does have some claudication symptoms in her right lower extremity which happen after walking for long distances.  PAST MEDICAL HISTORY: 1. Carotid artery disease. 2. Hypertension. 3. Hyperlipidemia. 4. History of stroke on May 22, 2001. 5. Osteoarthritis. 6. Peripheral vascular occlusive disease. 7. Status post stress test which revealed minimal coronary artery disease.  SURGICAL HISTORY: 1. Partial hysterectomy. 2. Cholecystectomy. 3. Right breast biopsy. 4. Cardiac catheterization done in December of 2002. 5. Tumor removed from her abdomen in June of  1979.  MEDICATIONS: 1. Lipitor 10 mg at bedtime. 2. Naproxen 500 mg twice a day. 3. Norvasc 5 mg daily. 4. Plavix 75 mg daily -- patient stopped this on February 14th. 5. Enalapril maleate 10 mg p.o. b.i.d. 6. Metoprolol 25 mg p.o. b.i.d.  ALLERGIES:  No known drug allergies.  REVIEW OF SYSTEMS:  Please see HPI for the significant positives, otherwise, patient does not have diabetes, kidney disease, asthma or coronary artery disease.  FAMILY HISTORY:  Mother is deceased from diabetes and stroke.  Father is deceased from colon cancer.  Sister and brothers have coronary artery disease and diabetes.  SOCIAL HISTORY:  Patient is divorced, has three children.  She is retired. She drinks about a cup of alcohol a week.  She currently smokes a half pack of cigarettes per day for 41 years.  PHYSICAL EXAMINATION:  VITAL SIGNS:  Blood pressure is 138/78, pulse is 76 and respiratory rate 18.  GENERAL:  This is a 65 year old African American in no acute distress who is alert and oriented x3.  HEENT:  Head is normocephalic, atraumatic.  Eyes are PERRLA/EOMI.  They are without cataracts, glaucoma or macular degeneration.  She wears glasses.  NECK:  Supple without JVD, bruits or lymphadenopathy.  CHEST:  Symmetrical on inspiration.  LUNGS:  Without wheezes, rhonchi, rales or lymphadenopathy.  HEART:  Regular rate and rhythm, without murmurs, gallops or rubs.  ABDOMEN:  Soft, nontender, with positive bowel sounds x4 quadrants, without masses or bruits.  GU:  Deferred.  RECTAL:  Deferred.  EXTREMITIES:  Without clubbing, cyanosis, edema or ulcerations.  They are warm bilaterally.  PERIPHERAL PULSES:  Carotid pulses are 2+ bilaterally.  She has 1+ femoral and popliteal pulses.  She does not have any distal or dorsalis pedis or posterior tibial pulses palpable at this time.  NEUROLOGIC:  There are no focal deficits.  She has a steady gait with a slight limp secondary to right leg  numbness, otherwise, her deep tendon reflexes and muscle strength are 2+ bilaterally.  ASSESSMENT AND PLAN:  Left internal carotid artery stenosis, for left carotid endarterectomy on June 06, 2001.  Dr. Kristen Loader. Early has seen and evaluated this patient prior to this admission and has explained the risks and benefits of the procedure and the patient has agreed to continue. Dictated by:   Dominica Severin, P.A. Attending Physician:  Alyson Locket DD:  06/04/01 TD:  06/04/01 Job: [id number]

## 2010-09-01 NOTE — Op Note (Signed)
[place]. [facility]  Patient:    [name], [name] Visit Number: [id number] MRN: [id number]          Service Type: SUR Location: 3300 3307 01 Attending Physician:  Alyson Locket Dictated by:   Larina Earthly, M.D. Proc. Date: 06/16/01 Admit Date:  06/16/2001   CC:         Dr. Jacinto Halim, [facility]   Operative Report  PREOPERATIVE DIAGNOSIS:  Symptomatic left carotid stenosis.  POSTOPERATIVE DIAGNOSIS:  Symptomatic left carotid stenosis.  OPERATION PERFORMED:  Left carotid endarterectomy, Dacron patch angioplasty.  SURGEON:  Larina Earthly, M.D.  ASSISTANT: 1. Caralee Ates, M.D. 2. [address] P.A.  ANESTHESIA:  MAC.  COMPLICATIONS:  None.  DISPOSITION:  To recovery room stable.  DESCRIPTION OF PROCEDURE:  The patient was taken to the operating room and placed in supine position where the area of the left neck was prepped and draped in the usual sterile fashion.  Incision was made in the anterior sternocleidomastoid and carried down through the platysma with electrocautery. The sternocleidomastoid muscle was ____________ the carotid sheath was opened.  The facial vein was ligated with 3-0 silk ties and divided.  The common carotid artery was encircled with umbilical tape and Rumel tourniquet. The superior thyroid artery was encircled with 2-0 silk Potts tie.  The external carotid artery was encircled with a blue vessel loop.  The internal carotid artery was encircled with an umbilical tape and Rumel tourniquet.  The patient was given 8000 units of intravenous heparin.  After adequate circulation time.  The internal, external and common carotid arteries were occluded.  The common carotid artery was opened with 11 blade and extended longitudinally with Potts scissors.  A 10 shunt was passed up the internal carotid artery and allowed to back-bleed and then down the common carotid artery where it was secured with  a Rumel tourniquet.  The endarterectomy was begun on the common carotid artery and the plaque was divided proximally with Potts scissors.  The endarterectomy extended onto the bifurcation.  The external carotid artery was endarterectomized with eversion technique.  The internal carotid artery was endarterectomized in an open fashion.  The remaining atheromatous debris was removed from the endarterectomy plane.  The Finesse Hemashield Dacron patch was brought onto the field and was sewn as a patch angioplasty with a running 6-0 Prolene suture.  Prior to completion of the anastomosis, the shunt was removed and the anastomosis was completed.  The external followed by the common followed by the internal carotid artery occlusion clamp was removed.  Excellent flow characteristics were noted with a hand held Doppler in the internal and external carotid arteries.  The patient was given 50 mg of protamine to reverse heparin.  The wound was irrigated with saline, hemostasis electrocautery.  Wound closed 3-0 Vicryl ____________ sternocleidomastoid over the carotid sheath.  Next the platysma was closed with running 3-0 Vicryl suture.  Finally the skin was closed with 4-0 subcuticular Vicryl stitch.  Benzoin and Steri-Strips were applied and the patient was taken to the recovery room neurologically intact. Dictated by:   Larina Earthly, M.D. Attending Physician:  Alyson Locket DD:  06/16/01 TD:  06/16/01 Job: [id number]

## 2010-09-01 NOTE — Discharge Summary (Signed)
[place]  Patient:    ROCHELL, PUETT Visit Number: [id number] MRN: [id number]          Service Type: MED Location: 2000 2005 01 Attending Physician:  Darlin Priestly Dictated by:   Darcella Gasman Ingold, F.N.P.C. Admit Date:  04/10/2001 Discharge Date: 04/12/2001   CC:         Delrae Rend, M.D.  Alford Highland. Rankin, M.D., [facility]   Discharge Summary  DISCHARGE DIAGNOSES: 1. Chest pain nonspecific.    a. Negative myocardial infarction.    b. Minimal coronary artery disease. 2. Peripheral vascular disease of the iliac vessels. 3. Hyperlipidemia. 4. Hypertension, difficult to control. 5. Noncompliance.  DISCHARGE CONDITION:  Improved.  PROCEDURES:  On April 10, 2001 left heart catheterization by Dr. Lenise Herald.  DISCHARGE MEDICATIONS: 1. Vasotec 10 mg one twice a day. 2. Norvasc 5 mg daily. 3. Lipitor 10 mg one every evening. 4. Enteric coated aspirin 325 mg daily. 5. Toprol XL 25 daily.  DISCHARGE INSTRUCTIONS: 1. No strenuous activity.  No sexual activity for two days.  Then may    resume regular activities. 2. Low fat, low salt diet. 3. Wash right groin catheterization site with soap and water.  Call if any    bleeding, swelling, or drainage. 4. You need lower extremity arterial Dopplers.  Our office will arrange and    call you with the date and time. 5. Follow up with Dr. Jacinto Halim in two to three weeks.  Call the office    Monday for an appointment date and time.  HISTORY OF PRESENT ILLNESS:  A 65 year old African-American female presented to [facility] for Persantine Cardiolite and had chest heaviness and EKG changes.  She was given sublingual nitroglycerin, oxygen and had relief.  No injection had been done and she was sent to the ER at [facility].  She denied any chest pain or shortness of breath previous to being seen in our office.  In the ER no chest pain.  She had  LVH with repolarization abnormality in the ER.  Admission labs were normal.  PAST MEDICAL HISTORY:  Left carotid bruit stenosis.  Hypertension.  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  Osteoarthritis.  Hyperlipidemia and tobacco use.  OUTPATIENT MEDICATIONS:  Naproxen 250 every 12 hours and was to be on Lipitor and blood pressure medicines but has not taken.  She ran out.  ALLERGIES:  No known allergies.  FAMILY HISTORY, SOCIAL HISTORY, and REVIEW OF SYSTEMS:  See H&P.  PHYSICAL EXAMINATION AT DISCHARGE:  Blood pressure 180/68, pulse 54, respirations 20, temperature 97.  Alert and oriented female.  Chest clear to auscultation bilaterally.  Heart:  Regular rate and rhythm with normal S1 and S2.  Extremities without edema.  Groin catheterization site was stable.  No hematoma.  LABORATORY DATA:  TSH 2.027.  Total cholesterol 201, triglycerides 97, HDL 40, LDL 142.  Hemoglobin on admission 13.3, hematocrit 38, WBC 8.6, MCV 86.5, platelets 260, neutrophils 62, lymphs 29, monos 6, eos 3, baso 1.  Prothrombin time 13.4, INR 1, PTT 29.  Chemistry:  Sodium 141, potassium 3.7, chloride 107, CO2 27, glucose 81, BUN 13, creatinine 0.6, calcium 9.3, total protein 7.7, albumin 4.0.  No significant changes in any of her labs.  Please note, AST was 28, ALT 26, ALP 121, total bilirubin 0.4, magnesium 2.2.  Cardiac enzymes:  CK 113, 102 and 116.  MB 2.1, 1.8, and 1.7.  Troponin all  0.02 negative MI.  Chest x-ray on admission:  Cardiomegaly.  No evidence of acute disease.  EKGS:  Admission:  Sinus rhythm, nonspecific T wave abnormality.  Follow up on April 10, 2001:  No significant change.  HOSPITAL COURSE:  Mrs. McNickle came in from Persantine Cardiolite after having chest pain.  On arrival to the ER, no further chest pain.  She was stable.  She was admitted to telemetry unit and underwent cardiac catheterization which revealed mild coronary disease.  She does have a  history of peripheral vascular disease and she has some iliac stenosis that will be evaluated as an outpatient.  By April 12, 2001 the patient was stable. Blood pressure was somewhat better controlled and she would be discharged home.  She was seen by Dr. Jenne Campus and discharged. Dictated by:   Darcella Gasman Ingold, F.N.P.C. Attending Physician:  Darlin Priestly DD:  04/12/01 TD:  04/13/01 Job: [id number]

## 2010-09-01 NOTE — Discharge Summary (Signed)
NAME:  [name], [name]                        ACCOUNT NO.:  [id number]   MEDICAL RECORD NO.:  [id number]                   PATIENT TYPE:  OIB   LOCATION:  6531                                 FACILITY:  [facility]   PHYSICIAN:  Vonna Kotyk R. Jacinto Halim, M.D.                  DATE OF BIRTH:  July 30, 1945   DATE OF ADMISSION:  03/16/2002  DATE OF DISCHARGE:  03/17/2002                                 DISCHARGE SUMMARY   HISTORY OF PRESENT ILLNESS:  The patient is a 65 year old African-American  female patient of Vonna Kotyk R. Jacinto Halim, M.D. who has a history of peripheral  vascular disease.  She has had a carotid endarterectomy done.  She also has  noncritical CAD.  She came in as an outpatient for a PV angiogram secondary  to claudication symptoms with abnormal ABIs.  She underwent a PV angiogram  on March 16, 2002 by Dr. Yates Decamp.  She had 80% left iliac ostial  stenosis.  She had a right 70% iliac stenosis.  She also had a long SFA  occlusion on her left but she did have collateral flow.  She underwent PTA  and stenting to her left ostial iliac and also to her right iliac.  On the  morning of March 17, 2002, she was seen by Dr. Ilene Qua, considered  stable for discharge home.  She did have a borderline potassium at 3.5 and  she was given 20 mEq of potassium.   DISCHARGE MEDICATIONS:  1. Enteric-coated aspirin 81 mg once per day.  2. Plavix 75 mg once per day.  3. Norvasc 10 mg once per day.  4. B12 4 mg once per day.  5. Metoprolol 25 mg twice per day.  6. Vasotec 10 mg twice per day.  7. She is to stop taking her Naprosyn; however, she tells me she has not     been taking it.  8. Lipitor 10 mg once per day.   DISCHARGE INSTRUCTIONS:  She is to do no strenuous activity and no driving  or lifting for four days.   FOLLOW UP:  She will follow up with lower extremity Dopplers on December  16th at 1 p.m.  She will follow up with Dr. Yates Decamp on December 16th at 9  a.m.   DISCHARGE DIAGNOSES:  1. Claudication with abnormal ankle brachial index.  2. Peripheral vascular disease status post pulmonary vein angiogram with 80%     right common iliac and left common iliac at the ostium with subsequent     bilateral iliac stenting.  She also had an occluded left superficial     femoral artery with long segment which had excellent collaterals.  3. Status post carotid endarterectomy.  4.     History of noncritical coronary artery disease.  5. Dyslipidemia.  6. Hypertension.     Lezlie Octave, [place].P.  Cristy Hilts. Jacinto Halim, M.D.    BB/MEDQ  D:  03/17/2002  T:  03/17/2002  Job:  [id number]   cc:   Jillyn Hidden A. Rankin, M.D.  [address]  [place]  [place] [address]  Fax: [phone]

## 2010-09-08 ENCOUNTER — Ambulatory Visit (INDEPENDENT_AMBULATORY_CARE_PROVIDER_SITE_OTHER): Payer: Medicare Other | Admitting: Vascular Surgery

## 2010-09-08 DIAGNOSIS — L98499 Non-pressure chronic ulcer of skin of other sites with unspecified severity: Secondary | ICD-10-CM

## 2010-09-12 NOTE — Assessment & Plan Note (Signed)
OFFICE VISIT  Brower, Chany V DOB:  1945/08/05                                       09/08/2010 CHART#:[id number]  Post operative followup  HISTORY OF PRESENT ILLNESS:  This is a patient I had previously seen and diagnosed with a nonsalvageable leg.  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  Apparently since her staples were removed she has had some areas of skin breakdown and dehiscence at the amputation incision.  She denies any fevers or chills. This is currently being packed with iodoform in the wound.  PHYSICAL EXAMINATION:  Today her blood pressure is 125/60, heart rate 65, respirations were 12.  On focused examination the right AKA demonstrates multiple areas of incision breakdown without exposure of the bone.  This appears to be relatively superficial breakdown.  I do not see the fascial suture line in the wound.  There is some necrotic fat in a few of these areas but no frank pus.  I do not see any green drainage or smell.  MEDICAL DECISION MAKING:  This is a 65 year old female who is now status post a right above the knee amputation with wound dehiscence.  I would recommend at this point proceeding with Santyl ointment as a chemical debrider in the wound at least twice a day with wet-to-dry dressings on top.  The patient has followup with Dr. Myra Gianotti in I believe a couple of weeks.  Hopefully by that point the wounds will have cleaned up enough that he will feel comfortable placement of a VAC dressing on this right above the knee amputation.  I do not think at this point there is a clear indication for a more proximal right above the knee amputation. The patient at this point demonstrates no frank infection from this wound and I think more conservative management of this stump would be acceptable at this point.  So I will have the  patient follow up with Dr. Myra Gianotti in a couple of weeks.    [name], MD Electronically Signed  BLC/MEDQ  D:  09/08/2010  T:  09/12/2010  Job:  [id number]  cc:   Lehman Prom, FNP Georgann Housekeeper, MD

## 2010-09-14 ENCOUNTER — Ambulatory Visit (INDEPENDENT_AMBULATORY_CARE_PROVIDER_SITE_OTHER): Payer: Medicare Other

## 2010-09-14 DIAGNOSIS — T8132XA Disruption of internal operation (surgical) wound, not elsewhere classified, initial encounter: Secondary | ICD-10-CM

## 2010-09-14 NOTE — Assessment & Plan Note (Signed)
OFFICE VISIT  [name], [name] DOB:  05/27/1945                                       09/14/2010 CHART#:[id number]  DATE OF SURGERY:  July 27, 2010.  The patient presents today for continued evaluation of a wound dehiscence of her right above-knee amputation.  The amputation was completed on July 27, 2010.  She was recently evaluated by Dr. Imogene Burn in the office on Sep 08, 2010 after her staples had been removed, and she was found to have a superficial breakdown of the incision.  At that evaluation, Dr. Imogene Burn felt that the breakdown was in fact superficial and that her dressings should be changed to Santyl ointment with a wet-to- dry dressing b.i.d.  The patient is not able to provide reliable information, but from the medication list that was provided by the nursing home, it appears that these instructions have been carried out. The patient is without complaint otherwise.  The nursing home requested the patient to be seen today secondary to possible progression of the wound.  PHYSICAL EXAMINATION:  Heart rate 60, O2 saturation 100% on room air, temperature 98 degrees.  The right AKA is inspected.  There does not appear to be any worsening of the wound for further deterioration. There is improvement in the necrotic tissue at the base of the wound. There is some remaining necrotic tissue in the medial wound.  There is no evidence of cellulitis or erythema present.  There is no bone exposure noted.  Santyl and wet-to-dry dressing is in place.  I do not see any green drainage or frank pus.  This is difficult to assess secondary to the ointment in place.  ASSESSMENT/PLAN:  The patient should continue on the Santyl and wet-to- dry dressing changes b.i.d., as previously documented by Dr. Imogene Burn.  It is his thought that if the wound continues to improve with this, that she could possibly have a VAC placed.  The wound is certainly not at a stage where that could  be considered yet.  The patient is afebrile in the office, and she is unable to provide any other information regarding her health at the care facility.  They did not provide any information other than a medication list.  The patient is also currently taking a course of Levaquin, and she should complete that as prescribed by the physician at the skilled nursing facility.  We will bring the patient back in for a follow-up appointment in 1-2 weeks with Dr. Myra Gianotti. Documentation was provided to the patient to return to the skilled nursing facility with instructions to continue current dressing changes as well as that if the patient were to become febrile or develop deterioration of the wound, they should proceed to the emergency room for evaluation and possible admission.  Patient was discussed with Dr. Imogene Burn today as well.  Pecola Leisure, PA  Charles E. Fields, MD Electronically Signed  AY/MEDQ  D:  09/14/2010  T:  09/14/2010  Job:  [id number]

## 2010-09-27 ENCOUNTER — Encounter (HOSPITAL_BASED_OUTPATIENT_CLINIC_OR_DEPARTMENT_OTHER): Payer: Medicare Other | Attending: Nurse Practitioner

## 2010-09-27 ENCOUNTER — Inpatient Hospital Stay (HOSPITAL_COMMUNITY)
Admission: EM | Admit: 2010-09-27 | Discharge: 2010-10-02 | DRG: 690 | Disposition: A | Discharge status: Skilled Nursing Facility | Payer: Medicare Other | Attending: Internal Medicine | Admitting: Internal Medicine

## 2010-09-27 ENCOUNTER — Emergency Department (HOSPITAL_COMMUNITY): Payer: Medicare Other

## 2010-09-27 DIAGNOSIS — D72829 Elevated white blood cell count, unspecified: Secondary | ICD-10-CM | POA: Diagnosis present

## 2010-09-27 DIAGNOSIS — D649 Anemia, unspecified: Secondary | ICD-10-CM | POA: Diagnosis present

## 2010-09-27 DIAGNOSIS — Z8673 Personal history of transient ischemic attack (TIA), and cerebral infarction without residual deficits: Secondary | ICD-10-CM | POA: Insufficient documentation

## 2010-09-27 DIAGNOSIS — Z794 Long term (current) use of insulin: Secondary | ICD-10-CM

## 2010-09-27 DIAGNOSIS — E1159 Type 2 diabetes mellitus with other circulatory complications: Secondary | ICD-10-CM | POA: Insufficient documentation

## 2010-09-27 DIAGNOSIS — I739 Peripheral vascular disease, unspecified: Secondary | ICD-10-CM | POA: Insufficient documentation

## 2010-09-27 DIAGNOSIS — K449 Diaphragmatic hernia without obstruction or gangrene: Secondary | ICD-10-CM | POA: Diagnosis present

## 2010-09-27 DIAGNOSIS — N390 Urinary tract infection, site not specified: Principal | ICD-10-CM | POA: Diagnosis present

## 2010-09-27 DIAGNOSIS — E669 Obesity, unspecified: Secondary | ICD-10-CM | POA: Insufficient documentation

## 2010-09-27 DIAGNOSIS — Z7982 Long term (current) use of aspirin: Secondary | ICD-10-CM | POA: Insufficient documentation

## 2010-09-27 DIAGNOSIS — E876 Hypokalemia: Secondary | ICD-10-CM | POA: Diagnosis not present

## 2010-09-27 DIAGNOSIS — Z7902 Long term (current) use of antithrombotics/antiplatelets: Secondary | ICD-10-CM

## 2010-09-27 DIAGNOSIS — I10 Essential (primary) hypertension: Secondary | ICD-10-CM | POA: Diagnosis present

## 2010-09-27 DIAGNOSIS — S31109A Unspecified open wound of abdominal wall, unspecified quadrant without penetration into peritoneal cavity, initial encounter: Secondary | ICD-10-CM | POA: Insufficient documentation

## 2010-09-27 DIAGNOSIS — I96 Gangrene, not elsewhere classified: Secondary | ICD-10-CM | POA: Insufficient documentation

## 2010-09-27 DIAGNOSIS — S81009A Unspecified open wound, unspecified knee, initial encounter: Secondary | ICD-10-CM | POA: Insufficient documentation

## 2010-09-27 DIAGNOSIS — E785 Hyperlipidemia, unspecified: Secondary | ICD-10-CM | POA: Insufficient documentation

## 2010-09-27 DIAGNOSIS — Z79899 Other long term (current) drug therapy: Secondary | ICD-10-CM | POA: Insufficient documentation

## 2010-09-27 DIAGNOSIS — Z9861 Coronary angioplasty status: Secondary | ICD-10-CM

## 2010-09-27 DIAGNOSIS — X58XXXA Exposure to other specified factors, initial encounter: Secondary | ICD-10-CM | POA: Insufficient documentation

## 2010-09-27 DIAGNOSIS — I2510 Atherosclerotic heart disease of native coronary artery without angina pectoris: Secondary | ICD-10-CM | POA: Diagnosis present

## 2010-09-27 DIAGNOSIS — E872 Acidosis, unspecified: Secondary | ICD-10-CM | POA: Diagnosis present

## 2010-09-27 DIAGNOSIS — E870 Hyperosmolality and hypernatremia: Secondary | ICD-10-CM | POA: Diagnosis present

## 2010-09-27 DIAGNOSIS — Z950 Presence of cardiac pacemaker: Secondary | ICD-10-CM

## 2010-09-27 DIAGNOSIS — E119 Type 2 diabetes mellitus without complications: Secondary | ICD-10-CM | POA: Diagnosis present

## 2010-09-27 DIAGNOSIS — S78119A Complete traumatic amputation at level between unspecified hip and knee, initial encounter: Secondary | ICD-10-CM

## 2010-09-27 DIAGNOSIS — S81809A Unspecified open wound, unspecified lower leg, initial encounter: Secondary | ICD-10-CM | POA: Insufficient documentation

## 2010-09-27 LAB — URINALYSIS, ROUTINE W REFLEX MICROSCOPIC
Bilirubin Urine: NEGATIVE
Glucose, UA: NEGATIVE mg/dL
Protein, ur: 30 mg/dL — AB
Specific Gravity, Urine: 1.02 (ref 1.005–1.030)
Urobilinogen, UA: 0.2 mg/dL (ref 0.0–1.0)

## 2010-09-27 LAB — URINE MICROSCOPIC-ADD ON

## 2010-09-27 LAB — CBC
HCT: 31.6 % — ABNORMAL LOW (ref 36.0–46.0)
Hemoglobin: 10.1 g/dL — ABNORMAL LOW (ref 12.0–15.0)
MCHC: 32 g/dL (ref 30.0–36.0)
MCV: 91.3 fL (ref 78.0–100.0)
RDW: 17.4 % — ABNORMAL HIGH (ref 11.5–15.5)

## 2010-09-27 LAB — DIFFERENTIAL
Basophils Absolute: 0 10*3/uL (ref 0.0–0.1)
Basophils Relative: 0 % (ref 0–1)
Eosinophils Absolute: 0 10*3/uL (ref 0.0–0.7)
Neutro Abs: 15.9 10*3/uL — ABNORMAL HIGH (ref 1.7–7.7)
Neutrophils Relative %: 82 % — ABNORMAL HIGH (ref 43–77)

## 2010-09-27 LAB — BASIC METABOLIC PANEL
BUN: 85 mg/dL — ABNORMAL HIGH (ref 6–23)
CO2: 14 mEq/L — ABNORMAL LOW (ref 19–32)
Calcium: 11.1 mg/dL — ABNORMAL HIGH (ref 8.4–10.5)
GFR calc non Af Amer: 31 mL/min — ABNORMAL LOW (ref >60)
Glucose, Bld: 99 mg/dL (ref 70–99)

## 2010-09-27 LAB — GLUCOSE, CAPILLARY: Glucose-Capillary: 97 mg/dL (ref 70–99)

## 2010-09-28 LAB — BASIC METABOLIC PANEL
BUN: 71 mg/dL — ABNORMAL HIGH (ref 6–23)
BUN: 55 mg/dL — ABNORMAL HIGH (ref 6–23)
Calcium: 10.3 mg/dL (ref 8.4–10.5)
Chloride: >130 mEq/L (ref 96–112)
Creatinine, Ser: 1.29 mg/dL — ABNORMAL HIGH (ref 0.4–1.2)
Creatinine, Ser: 1.11 mg/dL (ref 0.4–1.2)
GFR calc Af Amer: 50 mL/min — ABNORMAL LOW (ref >60)
GFR calc non Af Amer: 41 mL/min — ABNORMAL LOW (ref >60)
GFR calc non Af Amer: 49 mL/min — ABNORMAL LOW (ref >60)
Glucose, Bld: 147 mg/dL — ABNORMAL HIGH (ref 70–99)
Potassium: 3.6 mEq/L (ref 3.5–5.1)

## 2010-09-28 LAB — GLUCOSE, CAPILLARY
Glucose-Capillary: 155 mg/dL — ABNORMAL HIGH (ref 70–99)
Glucose-Capillary: 149 mg/dL — ABNORMAL HIGH (ref 70–99)

## 2010-09-28 LAB — CBC
HCT: 31.7 % — ABNORMAL LOW (ref 36.0–46.0)
MCHC: 32.2 g/dL (ref 30.0–36.0)
MCV: 92.2 fL (ref 78.0–100.0)
Platelets: 216 10*3/uL (ref 150–400)
RDW: 17.3 % — ABNORMAL HIGH (ref 11.5–15.5)
WBC: 17.8 10*3/uL — ABNORMAL HIGH (ref 4.0–10.5)

## 2010-09-28 LAB — HEMOGLOBIN A1C
Hgb A1c MFr Bld: 5.3 % (ref <5.7)
Mean Plasma Glucose: 105 mg/dL (ref <117)

## 2010-09-28 LAB — LIPID PANEL: Cholesterol: 112 mg/dL (ref 0–200)

## 2010-09-29 ENCOUNTER — Inpatient Hospital Stay (HOSPITAL_COMMUNITY): Payer: Medicare Other

## 2010-09-29 LAB — BASIC METABOLIC PANEL
BUN: 42 mg/dL — ABNORMAL HIGH (ref 6–23)
Calcium: 9.5 mg/dL (ref 8.4–10.5)
GFR calc Af Amer: >60 mL/min (ref >60)
GFR calc non Af Amer: >60 mL/min (ref >60)
Glucose, Bld: 134 mg/dL — ABNORMAL HIGH (ref 70–99)
Sodium: 148 mEq/L — ABNORMAL HIGH (ref 135–145)

## 2010-09-29 LAB — CBC
HCT: 29.9 % — ABNORMAL LOW (ref 36.0–46.0)
Hemoglobin: 9.5 g/dL — ABNORMAL LOW (ref 12.0–15.0)
MCH: 29.4 pg (ref 26.0–34.0)
MCHC: 31.8 g/dL (ref 30.0–36.0)
RDW: 17.1 % — ABNORMAL HIGH (ref 11.5–15.5)

## 2010-09-29 LAB — URINE CULTURE

## 2010-09-29 LAB — GLUCOSE, CAPILLARY

## 2010-09-30 LAB — BASIC METABOLIC PANEL
BUN: 32 mg/dL — ABNORMAL HIGH (ref 6–23)
GFR calc Af Amer: >60 mL/min (ref >60)
GFR calc non Af Amer: >60 mL/min (ref >60)
Potassium: 3.6 mEq/L (ref 3.5–5.1)
Sodium: 145 mEq/L (ref 135–145)

## 2010-09-30 LAB — GLUCOSE, CAPILLARY: Glucose-Capillary: 101 mg/dL — ABNORMAL HIGH (ref 70–99)

## 2010-09-30 LAB — CBC
MCHC: 33 g/dL (ref 30.0–36.0)
Platelets: 191 10*3/uL (ref 150–400)
RDW: 16.2 % — ABNORMAL HIGH (ref 11.5–15.5)

## 2010-10-01 LAB — GLUCOSE, CAPILLARY
Glucose-Capillary: 144 mg/dL — ABNORMAL HIGH (ref 70–99)
Glucose-Capillary: 77 mg/dL (ref 70–99)

## 2010-10-01 LAB — BASIC METABOLIC PANEL
BUN: 22 mg/dL (ref 6–23)
Chloride: 119 mEq/L — ABNORMAL HIGH (ref 96–112)
GFR calc Af Amer: >60 mL/min (ref >60)
Potassium: 3 mEq/L — ABNORMAL LOW (ref 3.5–5.1)

## 2010-10-01 LAB — CBC
HCT: 25 % — ABNORMAL LOW (ref 36.0–46.0)
RDW: 15.8 % — ABNORMAL HIGH (ref 11.5–15.5)
WBC: 13.2 10*3/uL — ABNORMAL HIGH (ref 4.0–10.5)

## 2010-10-01 LAB — CK: Total CK: 66 U/L (ref 7–177)

## 2010-10-02 ENCOUNTER — Ambulatory Visit: Payer: Medicare Other | Admitting: Surgery

## 2010-10-02 LAB — BASIC METABOLIC PANEL
Calcium: 8.4 mg/dL (ref 8.4–10.5)
GFR calc Af Amer: >60 mL/min (ref >60)
GFR calc non Af Amer: >60 mL/min (ref >60)
Sodium: 142 mEq/L (ref 135–145)

## 2010-10-02 LAB — GLUCOSE, CAPILLARY
Glucose-Capillary: 109 mg/dL — ABNORMAL HIGH (ref 70–99)
Glucose-Capillary: 118 mg/dL — ABNORMAL HIGH (ref 70–99)

## 2010-10-02 LAB — CBC
MCH: 29.2 pg (ref 26.0–34.0)
MCHC: 33.5 g/dL (ref 30.0–36.0)
Platelets: 109 10*3/uL — ABNORMAL LOW (ref 150–400)
RBC: 3.12 MIL/uL — ABNORMAL LOW (ref 3.87–5.11)
RDW: 16 % — ABNORMAL HIGH (ref 11.5–15.5)

## 2010-10-03 LAB — CULTURE, BLOOD (ROUTINE X 2)
Culture  Setup Time: 201206132329
Culture: NO GROWTH

## 2010-10-09 ENCOUNTER — Ambulatory Visit (INDEPENDENT_AMBULATORY_CARE_PROVIDER_SITE_OTHER): Payer: Medicare Other | Admitting: Surgery

## 2010-10-09 DIAGNOSIS — T8189XA Other complications of procedures, not elsewhere classified, initial encounter: Secondary | ICD-10-CM

## 2010-10-09 DIAGNOSIS — I70219 Atherosclerosis of native arteries of extremities with intermittent claudication, unspecified extremity: Secondary | ICD-10-CM

## 2010-10-10 NOTE — Assessment & Plan Note (Signed)
OFFICE VISIT  [name], [name] DOB:  02-Jan-1946                                       10/09/2010 CHART#:[id number]  She comes back today for follow-up.  She has undergone right above-knee amputation.  She was recently hospitalized.  She had some skin breakdown.  I had recommended putting her in a Wound VAC.  This has not happened yet.  Today her wound looks healthy.  There are 3 open areas on the AKA stump, the largest being on the medial side.  There is a mild amount of fibrinopurulent tissue with no bio-film.  There is also early left heel ulcer.  She is in a protective boot at this time.  I would like to get her put into a Wound VAC for her AKA stump, and she will need to have protection over her left heel at all times.  I will see her back in a month.    Jorge Ny, MD Electronically Signed  VWB/MEDQ  D:  10/09/2010  T:  10/10/2010  Job:  [id number]

## 2010-10-13 NOTE — H&P (Signed)
[name], [name]              ACCOUNT NO.:  [id number]  MEDICAL RECORD NO.:  [id number]  LOCATION:  [facility]                         FACILITY:  [facility]  PHYSICIAN:  Houston Siren, MD           DATE OF BIRTH:  1945/06/08  DATE OF ADMISSION:  09/27/2010 DATE OF DISCHARGE:                             HISTORY & PHYSICAL   PRIMARY CARE PHYSICIAN:  [facility].  ADVANCE DIRECTIVE:  Full code.  This was reconfirmed tonight.  PRIMARY CARDIOLOGIST:  Nanetta Batty, MD  REASON FOR ADMISSION:  Altered mental status and UTI.  HISTORY OF PRESENT ILLNESS:  This is a 65 year old female status post right AKA for an ischemic leg 2 months ago, status post permanent pacemaker placement, coronary artery disease status post cardiac stent placement, hypertension, diabetes, prior TIA, status post carotid endarterectomy, a nursing home resident, presents to the emergency room with progressive lethargy and increased confusion.  She was diagnosed having a UTI by Dr. Eula Listen and was given intramuscular Rocephin.  Since she was not getting better with decreased appetite, increased weakness and confusion, she was brought here.  Evaluation in the emergency room showed urinalysis with large leukocyte esterase, 21-50 wbc's, few bacteria.  She was also noted to have serum sodium of 157 and creatinine of 1.63.  Her lactic acid is 0.7.  She has marked leukocytosis with a white count of 19,500 and hemoglobin of 10.1.  Her chest x-ray only shows cardiomegaly, but no infiltrate.  Hospitalist was asked to admit the patient for urinary tract infection and altered mental status.  PAST MEDICAL HISTORY:  Coronary artery disease, hypertension, diabetes, hyperlipidemia, TIA, cardiac stent status post carotid endarterectomy, hiatal hernia, hysterectomy, IVC filter, status post right AKA.  SOCIAL HISTORY:  She was a prior smoker.  She denies alcohol or drug use.  Currently, she is in a nursing home.  ALLERGIES:  IV  DYE.  CURRENT MEDICATIONS:  Includes albuterol, aspirin, Celexa, fentanyl transdermal, iron supplement, Flonase, Lasix, Lexapro, Lyrica, Norvasc, pravastatin, Periostat, Tylenol, and Vicodin.  REVIEW OF SYSTEMS:  Otherwise unremarkable.  FAMILY HISTORY:  Noncontributory.  PHYSICAL EXAM:  VITAL SIGNS:  Blood pressure 156/43, pulse of 90, temperature 98.7. GENERAL:  She is alert, slightly confused, but conversing.  She will look around and tracking her environment. HEENT:  Sclerae nonicteric. NECK:  Supple.  Throat is clear.  Tongue is midline.  Uvula elevated with phonation. CARDIAC:  Revealed S1 and S2, regular.  I did not hear any murmur, rub, or gallop. LUNGS:  Clear. ABDOMEN:  Soft, nondistended, nontender. EXTREMITIES:  She is status post right AKA.  Her left lower extremity without calf tenderness.  She has adequate distal pulses. SKIN:  Warm and dry. NEUROLOGIC:  Otherwise unremarkable. PSYCHIATRIC:  Inadequately evaluated due to her condition.  OBJECTIVE FINDINGS:  Chest x-ray showed cardiomegaly, but no infiltrate. Serum sodium 157, potassium 4.0, chloride 129, glucose 99, creatinine 1.63.  Urinalysis showed large leukocyte esterase, 21-50 wbc's, few bacteria.  Lactic acid is 0.7.  White count of 19,500, hemoglobin of 10.1, platelet count 269,000.  IMPRESSION:  This is a 65 year old female, nursing home resident admitted for altered mental status, likely from urinary tract infection.  I will admit her to the regular floor and will assign to [facility] Team 6.  She has received Rocephin and I will switch over to Cipro intravenously.  As soon as her medications are reconciled, we will continue them except the Rocephin.  She is volume depleted and hyponatremic and certainly will need intravenous fluid.  We will give D5 half-normal and follow her serum sodium carefully.  She is a full code and this will be honored.  She is stable otherwise.     Houston Siren,  MD     PL/MEDQ  D:  09/27/2010  T:  09/27/2010  Job:  [id number]  cc:   Dala Dock  Electronically Signed by Houston Siren  on 10/13/2010 09:01:05 PM

## 2010-10-14 NOTE — Discharge Summary (Signed)
[name], [name]              ACCOUNT NO.:  [id number]  MEDICAL RECORD NO.:  [id number]  LOCATION:  5505                         FACILITY:  [facility]  PHYSICIAN:  Lonia Blood, M.D.       DATE OF BIRTH:  November 30, 1945  DATE OF ADMISSION:  09/27/2010 DATE OF DISCHARGE:  10/02/2010                        DISCHARGE SUMMARY - REFERRING   PRIMARY CARE PHYSICIAN:  [facility].  She is a member of [employer].  PRIMARY CARDIOLOGIST:  Nanetta Batty, MD  DISCHARGE DIAGNOSES: 1. Acute altered mental status with non-anion gap metabolic acidosis. 2. Urinary tract infection. 3. Severe dehydration. 4. 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 5. Hypernatremia. 6. Hypokalemia.  DISCHARGE MEDICATIONS: 1. Augmentin 500 mg 1 pill by mouth q.12 hours for 5 days. 2. Santyl ointment 1 application topically twice a day ongoing. 3. Ensure 237 mL by mouth 3 times a day with meals. 4. MiraLax. 5. Polyethylene glycol 17 grams p.o. daily as needed for constipation,     hold for diarrhea. 6. Multivitamin 1 tablet by mouth daily. 7. Albuterol nebulizer 0.083%, 1 nebulization inhaled twice daily     scheduled. 8. Albuterol nebulizer 0.083%, 1 nebulization inhaled as needed for     shortness of breath. 9. Amlodipine 10 mg 1 tablet by mouth daily. 10.Aspirin 325 mg 1 tablet by mouth daily. 11.Ditropan XL 5 mg 1 tablet by mouth daily. 12.Enalapril 10 mg 1 tablet by mouth twice daily. 13.Fentanyl patch 12.5 mcg 1 patch transdermally every 3 days. 14.Ferrous sulfate 325 mg 1 tablet by mouth every evening. 15.Flonase 50 mcg 2 sprays nasally daily. 16.Humalog insulin sliding scale sensitive as directed. 17.Hydralazine 50 mg 1 tablet by mouth 4 times a day. 18.Hydrocodone/acetaminophen 5/500, 1-2 tablets by mouth every 4 hours     as needed for pain. 19.Labetalol 300 mg half tablet by mouth every 8 hours. 20.Lexapro 20 mg 1 tablet by mouth  daily. 21.Lyrica 50 mg 1 capsule by mouth twice daily. 22.Peridex oral rinse 10 mL by mouth 4 times a day. 23.Plavix 75 mg 1 tablet by mouth daily. 24.Pravastatin 80 mg 1 tablet by mouth daily at bedtime. 25.Pro-Stat protein supplement liquid 30 mL by mouth daily at bedtime. 26.Tylenol 325 mg 2 tablets by mouth every 6 hours as needed for pain.     Do not give with hydrocodone/acetaminophen. 27.Vitamin D 50,000 units 1 capsule by mouth weekly.  Please take on     Fridays.  CONSULTATION:  The patient was seen in consultation by Dr. Myra Gianotti of Vascular Surgery for her above-the-knee knee amputation with open wound.  HISTORY AND BRIEF HOSPITAL COURSE:  This is a 65 year old female who is status post right AKA for ischemic leg approximately 2 months ago.  She is status post pacemaker placement and coronary artery disease as well as cardiac stents.  She presented to the emergency room from skilled nursing facility with progressive lethargy and increased confusion.  She was diagnosed as having a UTI by Dr. Donette Larry and given an intramuscularRocephin at the skilled nursing facility.  However, she was not getting better and had decreased appetite, increasing weakness, and confusion. 1. Urinary tract infection.  On admission,  the patient had large     leukocytes and 21-50 white blood cells in her urine.  Urine culture     after she was on antibiotic showed no growth.  The patient was     started on IV Rocephin and maintained on Rocephin throughout her     hospitalization.  She will be discharged back to skilled nursing     facility with Augmentin p.o. x5 days. 2. Altered mental status with non-anion gap metabolic acidosis.  On     admission, the patient was very confused.  Her chloride was found     to be greater than 130.  Sodium 157, bicarb was 13, creatinine     initially was 1.63, BUN 85.  She was given IV half normal saline     with D5 and gradually her electrolytes improved.  She was also      started on bicarbonate.  During the hospitalization,     her potassium dropped as she had several bowel movements.  Her     potassium has now been repleted and her BMET on the day of     discharge is as follows.  Sodium 142, potassium 5.3, chloride 114,     bicarb 19 glucose 85, BUN 16, creatinine 0.76, calcium 8.4.  Her     electrolytes normalized and her volume status was replenished.  The     patient's mental status returned to baseline which is still mildly     demented, however, her eyes are open and bright.  She does attempt     to answer appropriately and her demeanor is calm and pleasant. 3. 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  The patient regularly follows with Dr. Myra Gianotti of Vascular     Surgery.  His team consulted on her during this hospitalization.  I     believe they did do some bedside debridement of the necrotic tissue     on her wound.  They recommended Santyl b.i.d. and twice a day wet-     to-dry dressing changes ongoing.  The patient will continue to     follow with Dr. Myra Gianotti in the office. 4. Of note today the patient has had 2 dark stools.  She does have     chronic anemia.  Today her hemoglobin is actually 9.1 which has     risen from yesterday.  We are not concerned for GI bleeding at this     time, however, it is noteworthy and the skilled nursing facility     staff may want to monitor her bowel movements closely for any signs     of blood, particularly as she is on both 325 mg aspirin and     Plavix.  PHYSICAL EXAMINATION AT THE TIME OF DISCHARGE:  GENERAL:  The patient is awake.  She is alert.  She is pleasant. VITAL SIGNS:  Temperature 97.5, pulse 60, respirations 20, blood pressure 164/61, O2 saturation is 95% on room air. HEENT:  Head is atraumatic, normocephalic.  Eyes are anicteric with pupils that are equal and round.  Nose shows no nasal discharge or exterior lesions.  Mouth has moist mucous membranes with poor  dentition. NECK:  Supple with midline trachea.  No JVD.  No lymphadenopathy. CHEST:  Demonstrates no accessory muscle use.  She has no wheezes or crackles to my auscultation. HEART:  Has a regular rate and rhythm without obvious murmurs, rubs, or gallops. ABDOMEN:  Obese, nontender,  nondistended with bowel sounds. EXTREMITIES:  Left lower extremity demonstrates no edema.  Right AKA is wrapped.  She has an open wound but it is wrapped in gauze.  Bandages clean and dry.  I will not remove it to examine this morning.  LABORATORY DATA:  June 18, the patient's white count 12.1, hemoglobin 9.1, hematocrit 27.2, MCV value 87.2, platelets 109,000, glucose 109. Sodium 142, potassium 5.3, chloride 114, bicarb 19, glucose 85, BUN 16, creatinine 0.76, calcium 8.4, hemoglobin A1c taken on June 14 was 5.3. Cholesterol panel was as follows:  Cholesterol 112, triglycerides 122, HDL 29, LDL 59.  TSH 0.629.  PERTINENT RADIOLOGICAL EXAMS:  On June 13, the patient had a chest x-ray that showed borderline cardiomegaly and prominent interstitial markings on June 15.  She had an x-ray of her right AKA.  FINDINGS:  There has been a mid femur amputation.  There is air in the soft tissues.  Vascular calcification is notified.  No definite acute osseous findings.  She also had a chest x-ray on June 15 that shows diminished aeration, cannot exclude mild pulmonary vascular congestion with cardiomegaly.  DISCHARGE INSTRUCTIONS:  The patient will return to [facility] today.  ACTIVITY:  Per physical therapy.  DIET:  She has been liberated from a heart-healthy to a regular diet in order to encourage her p.o. intake.  She was observed here in the hospital and found to be eating only 50-75% of her meals.  It is recommended that she be started on Ensure supplements t.i.d. with meals and also that she have some assistance with her meals in order to ensure that she is eating and encouraged her  to eat and drink.  WOUND CARE:  The patient is to follow with Vascular Surgery.  She is to see Dr. Myra Gianotti in 2 weeks in his office.  An appointment will need to be scheduled.  She is to have wet-to-dry dressing changes twice a day and Santyl ointment applied topically to her wound twice a day.  FURTHER THINGS TO FOLLOW UP ON IN THE OUTPATIENT SETTING: 1. The patient's electrolytes have been improving, however, her     potassium today is slightly elevated at 5.3.  We would recommend     that she have a BMET taken on June 19 to be reviewed by her primary     care physician at the skilled nursing facility. 2. The patient has chronic anemia.  She did have dark stools in house     this morning.  We would recommend that she have a CBC taken in the     next 2-3 days at the Skilled Nursing Facility to make sure that her     hemoglobin has not dropped and that her bowel movements be     monitored for any potential blood.     Stephani Police, PA   ______________________________ Lonia Blood, M.D.    MLY/MEDQ  D:  10/02/2010  T:  10/02/2010  Job:  [id number]  cc:   [facility] Nanetta Batty, M.D.  Electronically Signed by Algis Downs PA on 10/09/2010 08:15:12 PM Electronically Signed by Lonia Blood M.D. on 10/14/2010 04:44:12 PM

## 2010-10-25 ENCOUNTER — Encounter (HOSPITAL_BASED_OUTPATIENT_CLINIC_OR_DEPARTMENT_OTHER): Payer: Medicare Other | Attending: Nurse Practitioner

## 2010-10-25 DIAGNOSIS — Z79899 Other long term (current) drug therapy: Secondary | ICD-10-CM | POA: Insufficient documentation

## 2010-10-25 DIAGNOSIS — I96 Gangrene, not elsewhere classified: Secondary | ICD-10-CM | POA: Insufficient documentation

## 2010-10-25 DIAGNOSIS — E669 Obesity, unspecified: Secondary | ICD-10-CM | POA: Insufficient documentation

## 2010-10-25 DIAGNOSIS — I10 Essential (primary) hypertension: Secondary | ICD-10-CM | POA: Insufficient documentation

## 2010-10-25 DIAGNOSIS — X58XXXA Exposure to other specified factors, initial encounter: Secondary | ICD-10-CM | POA: Insufficient documentation

## 2010-10-25 DIAGNOSIS — Z794 Long term (current) use of insulin: Secondary | ICD-10-CM | POA: Insufficient documentation

## 2010-10-25 DIAGNOSIS — E1159 Type 2 diabetes mellitus with other circulatory complications: Secondary | ICD-10-CM | POA: Insufficient documentation

## 2010-10-25 DIAGNOSIS — I739 Peripheral vascular disease, unspecified: Secondary | ICD-10-CM | POA: Insufficient documentation

## 2010-10-25 DIAGNOSIS — Z7982 Long term (current) use of aspirin: Secondary | ICD-10-CM | POA: Insufficient documentation

## 2010-10-25 DIAGNOSIS — S81809A Unspecified open wound, unspecified lower leg, initial encounter: Secondary | ICD-10-CM | POA: Insufficient documentation

## 2010-10-25 DIAGNOSIS — Z8673 Personal history of transient ischemic attack (TIA), and cerebral infarction without residual deficits: Secondary | ICD-10-CM | POA: Insufficient documentation

## 2010-10-25 DIAGNOSIS — S81009A Unspecified open wound, unspecified knee, initial encounter: Secondary | ICD-10-CM | POA: Insufficient documentation

## 2010-10-25 DIAGNOSIS — E785 Hyperlipidemia, unspecified: Secondary | ICD-10-CM | POA: Insufficient documentation

## 2010-10-25 DIAGNOSIS — S31109A Unspecified open wound of abdominal wall, unspecified quadrant without penetration into peritoneal cavity, initial encounter: Secondary | ICD-10-CM | POA: Insufficient documentation

## 2010-10-27 NOTE — Progress Notes (Signed)
[facility]  NAME:  [facility]                   ACCOUNT NO.:  MEDICAL RECORD NO.:  [id number]      DATE OF BIRTH:  08/17/1945  PHYSICIAN:  Wayland Denis, DO       VISIT DATE:  10/11/2010                                  OFFICE VISIT   Ms. [name] is here with her daughter.  She is a 65 year old black female who has multiple medical conditions including a right above-knee amputation, stump ulcer in several areas and an abdominal ulcer from scratching.  She was in mental good health until January when she had a stroke and then a second one in February.  She is now in a nursing home and a wheelchair.  She is getting negative pressures therapy for the abdominal wound which actually looks good with red granulating tissue. No sign of infection or excess fibrous tissue.  The stump is the same. It seems to be getting better according to the daughter, it is just taking a really long time.  PAST MEDICAL HISTORY:  Positive for: 1. Diabetes. 2. Hyperlipidemia. 3. High cholesterol. 4. Cerebrovascular accidents. 5. Coronary artery disease. 6. Neuropathy. 7. Anemia. 8. Dysphagia. 9. Right AKA in April 2012.  Her medications include albuterol, fentanyl, Lexapro, Lyrica, Plavix, pravastatin.  She is allergic to IV DYE.  Her surgeries include, but are not limited to the right AKA.  SOCIAL HISTORY:  She lives in a nursing home and has a daughter who seems to care for her well.  No alcohol or smoking noted.  FAMILY HISTORY:  Noncontributory.  REVIEW OF SYSTEMS:  Denies any recent change in the past several weeks.  PHYSICAL EXAMINATION:  GENERAL:  She is alert, cooperative.  She does not have full discussions, so I do not know how well she is at being a good historian.  Her daughter is giving most of the information. HEENT:  Her pupils are equal.  Her extraocular muscles are intact. NECK:  No cervical lymphadenopathy. LUNGS:  Breathing is unlabored. HEART:   Regular. ABDOMEN:  Soft, large, and with a wound. EXTREMITIES:  Her right lower extremity is an AKA.  The wounds are as described, they do not appear to be infected or have any significant amount of fibrous tissue.  They are red and granulating.  ASSESSMENT:  Right leg ulcer, diabetic, and abdominal ulcer.  RECOMMENDATIONS:  Continue with a negative therapy, vitamins, and we will check her prealbumin to see if that is a contributing factor.     Wayland Denis, DO     CS/MEDQ  D:  10/11/2010  T:  10/12/2010  Job:  [id number]

## 2010-11-03 ENCOUNTER — Ambulatory Visit
Admission: RE | Admit: 2010-11-03 | Discharge: 2010-11-03 | Disposition: A | Discharge status: Home / Self Care | Payer: Medicare Other | Source: Ambulatory Visit | Attending: Internal Medicine | Admitting: Internal Medicine

## 2010-11-03 ENCOUNTER — Other Ambulatory Visit: Payer: Self-pay | Admitting: Internal Medicine

## 2010-11-03 DIAGNOSIS — R609 Edema, unspecified: Secondary | ICD-10-CM

## 2010-11-06 ENCOUNTER — Ambulatory Visit: Payer: Medicare Other | Admitting: Surgery

## 2010-11-15 ENCOUNTER — Ambulatory Visit (INDEPENDENT_AMBULATORY_CARE_PROVIDER_SITE_OTHER): Payer: Medicare Other

## 2010-11-15 DIAGNOSIS — T8189XA Other complications of procedures, not elsewhere classified, initial encounter: Secondary | ICD-10-CM

## 2010-11-15 NOTE — Assessment & Plan Note (Signed)
OFFICE VISIT  [name], [name] DOB:  [birth year]                                       11/15/2010 CHART#:[id number]  The patient presents today for continued follow-up of a nonhealing right above-knee amputation.  The patient was last evaluated by Dr. Myra Gianotti in the office in June 2012.  At that time, her wound looked healthy and he recommended placement of a VAC.  The patient was being seen at the wound care facility for continued care of an abdominal wound, and they were also addressing the right above-knee amputation site as well.  The patient apparently has been doing well since her last evaluation and has been going to wound care on a regular basis.  The abdominal wound has healed, and she has just a collagenase ACE dressing placed there.  The right above-knee amputation does in fact have a VAC in place, and this is changed twice a week.  This is followed again at the wound care center.  The patient has also had an early left heel ulcer that was noted at her last evaluation as well.  She was in a protective boot, and Dr. Myra Gianotti instructed that this remain in place at all times.  Past medical history, past surgical history, review of systems and medication list are completely unchanged from previous visits. Medication list is available in the chart from the skilled nursing facility.  PHYSICAL EXAMINATION:  The patient is a wheelchair and is not very vocal.  She responds on occasion.  Her daughter is with her and states that this is her normal status.  The right above-knee amputation wound is inspected.  The VAC is removed.  The medial wound is beefy red with some granulation blood noted.  There is bleeding drainage present.  It is very superficial and has healthy tissue present.  There is no evidence of infection present.  The wound in the center of the amputation is also healing well and is quite small.  It is also very superficial with a pink base  and some granulation tissue present.  The left heel does not have an open wound.  There is some ecchymosis noted on the lateral aspect of the heel.  She is in a protective boot.  ASSESSMENT/PLAN:  The patient will continue to follow up at the wound care center for [facility] changes, and this will be discontinued per the wound care physician instructions.  She will need to continue to off-load the left heel at all times.  The patient will follow up with Dr. Myra Gianotti in approximately 1 month to reevaluate her wounds.  Her daughter has instructed her to call us with any questions, issues or problems in the interim.  Pecola Leisure, PA  Di Kindle. Edilia Bo, M.D. Electronically Signed  AY/MEDQ  D:  11/15/2010  T:  11/15/2010  Job:  [id number]

## 2010-11-29 ENCOUNTER — Encounter (HOSPITAL_BASED_OUTPATIENT_CLINIC_OR_DEPARTMENT_OTHER): Payer: Medicare Other | Attending: Plastic Surgery

## 2010-11-29 DIAGNOSIS — L98499 Non-pressure chronic ulcer of skin of other sites with unspecified severity: Secondary | ICD-10-CM | POA: Insufficient documentation

## 2010-11-29 DIAGNOSIS — T8789 Other complications of amputation stump: Secondary | ICD-10-CM | POA: Insufficient documentation

## 2010-11-29 DIAGNOSIS — L97809 Non-pressure chronic ulcer of other part of unspecified lower leg with unspecified severity: Secondary | ICD-10-CM | POA: Insufficient documentation

## 2010-11-29 DIAGNOSIS — Y835 Amputation of limb(s) as the cause of abnormal reaction of the patient, or of later complication, without mention of misadventure at the time of the procedure: Secondary | ICD-10-CM | POA: Insufficient documentation

## 2010-11-30 NOTE — Progress Notes (Signed)
[facility]  NAME:  [name], [name]              ACCOUNT NO.:  [id number]  MEDICAL RECORD NO.:  [id number]      DATE OF BIRTH:  July 28, 1945  PHYSICIAN:  Wayland Denis, DO       VISIT DATE:  11/29/2010                                  OFFICE VISIT   [name] is here with her daughter via transport for reevaluation of her abdominal and right lower extremity stump ulcers.  She has been using collagen on the stomach ulcer and the VAC on the stump ulcers. All of them have improved greatly.  There is no sign of infection.  She is granulating well.  There is no fibrous tissue to debride.  There has been no change in her medications or social history.  She is still at the care facility.  On exam, she is alert.  She is not conversive and information is obtained from her daughter, but she is not in any acute distress.  Her pupils are equal.  Her breathing is unlabored.  Her heart is regular.  Her abdomen is very large, but soft.  The wounds are markedly better on the abdomen as well as on the stump and described in HPI and nurse note and in the chart.  I recommend continue with the collagen, hydrogel, and TL and follow up in 3 weeks.  Hopefully at that time, she will be healed.     Wayland Denis, DO     CS/MEDQ  D:  11/29/2010  T:  11/30/2010  Job:  [id number]

## 2010-12-01 ENCOUNTER — Encounter: Payer: Self-pay | Admitting: Surgery

## 2010-12-25 ENCOUNTER — Encounter: Payer: Self-pay | Admitting: Surgery

## 2010-12-25 ENCOUNTER — Ambulatory Visit (INDEPENDENT_AMBULATORY_CARE_PROVIDER_SITE_OTHER): Payer: Medicare Other | Admitting: Surgery

## 2010-12-25 ENCOUNTER — Ambulatory Visit: Payer: Medicare Other | Admitting: Surgery

## 2010-12-25 VITALS — BP 159/69 | HR 60 | Resp 16

## 2010-12-25 DIAGNOSIS — T8189XA Other complications of procedures, not elsewhere classified, initial encounter: Secondary | ICD-10-CM

## 2010-12-25 DIAGNOSIS — I70219 Atherosclerosis of native arteries of extremities with intermittent claudication, unspecified extremity: Secondary | ICD-10-CM

## 2010-12-25 NOTE — Progress Notes (Signed)
The patient is back today for followup of a right above-knee amputation. She has been doing dressing changes for areas that have been slow to heal. She is no longer using a wound VAC she is getting collagen to these areas. There is a small opening in the midportion of her wound about 1 cm on the lateral side there is a small area about half a centimeter and on the medial side everything is healed.  She is wearing a brace of the left foot her left heel is without wound.  I'll see her back in 3 months to evaluating her wound. She has had a carotid stent by Dr. Corliss Skains and has not had ultrasound followup. I will send a copy of this note to him to schedule her for surveillance ultrasound otherwise noted that done in my office when she comes back in 3 months.

## 2010-12-27 ENCOUNTER — Encounter (HOSPITAL_BASED_OUTPATIENT_CLINIC_OR_DEPARTMENT_OTHER): Payer: Medicare Other | Attending: Plastic Surgery

## 2010-12-27 ENCOUNTER — Ambulatory Visit: Payer: Medicare Other

## 2010-12-27 DIAGNOSIS — L97809 Non-pressure chronic ulcer of other part of unspecified lower leg with unspecified severity: Secondary | ICD-10-CM | POA: Insufficient documentation

## 2010-12-27 DIAGNOSIS — T8789 Other complications of amputation stump: Secondary | ICD-10-CM | POA: Insufficient documentation

## 2010-12-27 DIAGNOSIS — Y835 Amputation of limb(s) as the cause of abnormal reaction of the patient, or of later complication, without mention of misadventure at the time of the procedure: Secondary | ICD-10-CM | POA: Insufficient documentation

## 2010-12-27 DIAGNOSIS — L98499 Non-pressure chronic ulcer of skin of other sites with unspecified severity: Secondary | ICD-10-CM | POA: Insufficient documentation

## 2010-12-28 NOTE — Progress Notes (Signed)
[facility]  NAME:  [name], [name]              ACCOUNT NO.:  [id number]  MEDICAL RECORD NO.:  [id number]      DATE OF BIRTH:  10/05/1945  PHYSICIAN:  Wayland Denis, DO            VISIT DATE:                                  OFFICE VISIT   Ms. [name] is here with her daughter for followup on her abdominal wound and right stump wound.  Wounds have done extremely well.  She has been using collagen and hydrogel.  The abdominal wound is nearly healed with only 2 areas that are superficial, almost like a blister, but the rest of it has completely granulated and epithelialized.  On the stump, she has 2 areas that have just a little bit of tunneling.  Those measurements are noted in the chart, but are healing.  There has been no change in her medications.  Review of systems is negative.  Her daughter states that she is eating pretty well at the nursing home.  PHYSICAL EXAMINATION:  GENERAL:  She seems to be oriented. HEENT:  Her pupils are equal. LUNGS:  Her breathing is unlabored. HEART:  Regular. ABDOMEN:  Soft.  The wounds are as described.  We will continue with collagen hydrogel and have her follow up in 3 weeks.  She very well may be healed at that time.     Wayland Denis, DO     CS/MEDQ  D:  12/27/2010  T:  12/28/2010  Job:  [id number]

## 2011-01-10 NOTE — Progress Notes (Signed)
[facility]  NAME:  [name], [name]              ACCOUNT NO.:  [id number]  MEDICAL RECORD NO.:  [id number]      DATE OF BIRTH:  1946-01-18  PHYSICIAN:  Wayland Denis, DO       VISIT DATE:  01/10/2011                                  OFFICE VISIT   Ms. Michna is here with her daughter for a new wound at the nursing home noted on the medial aspect of her right leg, the other wounds are doing extremely well and are almost healed.  The wound in her abdomen is very superficial, no sign of infection.  The one on her anterior right stump is healed with some dry skin.  Distally, it is very small with little bit of bloody drainage, but no sign of infection.  The medial aspect looks like it was a blister irritated by the Foley catheter and it is superficial, no sign of infection.  There has been no change in her medications.  No change in her social history.  On exam, she seems to be alert, she is not very communicative and mostly history is obtained per her daughter, but her pupils are equal.  Extraocular muscles are intact.  Her breathing is unlabored.  Her abdomen is large with a wound as described.  Her stump is as described.  We recommend collagen to the abdomen and distal stump, lotion to the anterior stump and triple antibiotic ointment with the DuoDerm to the medial side.  We will see her back at her appointment in October.     Wayland Denis, DO     CS/MEDQ  D:  01/10/2011  T:  01/10/2011  Job:  [id number]

## 2011-02-05 ENCOUNTER — Encounter: Payer: Self-pay | Admitting: Surgery

## 2011-02-05 ENCOUNTER — Ambulatory Visit (INDEPENDENT_AMBULATORY_CARE_PROVIDER_SITE_OTHER): Payer: Medicare Other | Admitting: Surgery

## 2011-02-05 VITALS — BP 148/67 | HR 60 | Temp 98.2°F

## 2011-02-05 DIAGNOSIS — L98499 Non-pressure chronic ulcer of skin of other sites with unspecified severity: Secondary | ICD-10-CM

## 2011-02-05 DIAGNOSIS — E1159 Type 2 diabetes mellitus with other circulatory complications: Secondary | ICD-10-CM

## 2011-02-05 DIAGNOSIS — I739 Peripheral vascular disease, unspecified: Secondary | ICD-10-CM

## 2011-02-05 NOTE — Progress Notes (Signed)
Vascular and Vein Specialist of [facility]   Patient name: [name] MRN: [id number] DOB: 02-Aug-1945 Sex: female     Chief Complaint  Patient presents with  . 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 Back and is now getting collagenases to the area the wound is nearly healed. She also is wearing an EHOB boots to the left leg to protect her heel. She has not had any neurologic symptoms she had a stent in her carotid artery placed by Dr. Corliss Skains in the setting of symptomatic carotid stenosis. She has not had a followup for this and  Past Medical History  Diagnosis Date  . Diabetes mellitus   . Hypertension   . Stroke   . Hyperlipidemia   . Anemia   . CAD (coronary artery disease)   . Arthritis   . Ulcer   . Hypernatremia   . Hypokalemia   . Carotid artery occlusion     Past Surgical History  Procedure Date  . Above knee leg amputation     RIGHT AKA  . Carotid endarterectomy     History   Social History  . Marital Status: Divorced    Spouse Name: N/A    Number of Children: N/A  . Years of Education: N/A   Occupational History  . Not on file.   Social History Main Topics  . Smoking status: Former Smoker -- 1.0 packs/day for 41 years    Types: Cigarettes    Quit date: 04/16/2010  . Smokeless tobacco: Not on file  . Alcohol Use: No  . Drug Use: No  . Sexually Active: Not on file   Other Topics Concern  . Not on file   Social History Narrative  . No narrative on file    Family History  Problem Relation Age of Onset  . Diabetes Mother   . Stroke Mother   . Cancer Father     colon  . Diabetes Sister   . Coronary artery disease Sister   . Diabetes Brother   . Coronary artery disease Brother     Allergies as of 02/05/2011 - Review  Complete 02/05/2011  Allergen Reaction Noted  . Iohexol  05/02/2010  . Latex  12/01/2010    Current Outpatient Prescriptions on File Prior to Visit  Medication Sig Dispense Refill  . acetaminophen (TYLENOL) 325 MG tablet Take 650 mg by mouth every 6 (six) hours as needed.        Marland Kitchen albuterol (PROVENTIL) (2.5 MG/3ML) 0.083% nebulizer solution Take 2.5 mg by nebulization 2 (two) times daily.        Marland Kitchen amLODipine (NORVASC) 10 MG tablet Take 10 mg by mouth daily.        Marland Kitchen aspirin 325 MG EC tablet Take 325 mg by mouth daily.        . chlorhexidine (PERIDEX) 0.12 % solution Use as directed 15 mLs in the mouth or throat 2 (two) times daily.        . Cholecalciferol (VITAMIN D3) 50000 UNITS CAPS Take 1 capsule by mouth once a week.        . clopidogrel (PLAVIX) 75 MG tablet Take 75 mg by mouth daily.        . enalapril (VASOTEC) 10 MG  tablet Take 10 mg by mouth daily.        Marland Kitchen escitalopram (LEXAPRO) 20 MG tablet Take 20 mg by mouth daily.        . fentaNYL (DURAGESIC - DOSED MCG/HR) 25 MCG/HR Place 1 patch onto the skin every 3 (three) days.        . ferrous sulfate 325 (65 FE) MG tablet Take 325 mg by mouth 2 (two) times daily.       . fluticasone (FLONASE) 50 MCG/ACT nasal spray Place 2 sprays into the nose daily.        . hydrALAZINE (APRESOLINE) 50 MG tablet Take 50 mg by mouth 4 (four) times daily.        Marland Kitchen labetalol (NORMODYNE) 300 MG tablet Take 300 mg by mouth. TAKE 1/2 TABLET every 8 hrs      . lidocaine (LIDODERM) 5 % Place 1 patch onto the skin daily. Remove & Discard patch within 12 hours or as directed by MD       . Multiple Vitamin (MULTIVITAMIN) tablet Take 1 tablet by mouth daily.        . Multiple Vitamins-Minerals (DECUBI-VITE) CAPS Take 1 capsule by mouth daily.        Marland Kitchen oxybutynin (DITROPAN-XL) 5 MG 24 hr tablet Take 5 mg by mouth daily.        Marland Kitchen oxyCODONE-acetaminophen (PERCOCET) 5-325 MG per tablet Take 1 tablet by mouth every 4 (four) hours as needed.        . Pollen Extracts  (PROSTAT PO) Take by mouth at bedtime.        . polyethylene glycol powder (MIRALAX) powder Take 17 g by mouth daily as needed.        . pravastatin (PRAVACHOL) 80 MG tablet Take 80 mg by mouth daily.        . pregabalin (LYRICA) 50 MG capsule Take 50 mg by mouth 3 (three) times daily.          REVIEW OF SYSTEMS: No changes from prior visit  PHYSICAL EXAMINATION: General: The patient appears their stated age. No acute distress Vital signs are BP 148/67  Pulse 60  Temp(Src) 98.2 F (36.8 C) (Oral) Cardiovascular: Pedal pulses are nonpalpable, regular rate and rhythm Extremities: The above-knee amputation site is nearly healed there is a small pin size hole in the medial portion of the wound I cannot find a ulcer on the left heel  Diagnostic Studies None  Assessment: 1: Right above-knee indication, nonhealing 2: Carotid stenosis status post stenting Plan: The patient will come back in 6 weeks for additional wound check. She will continue to get her wound care the wound center. This is nearly healed.  At the time of her next visit we will get an ultrasound of her carotid arteries to evaluate her stent which has not yet been evaluated  V. Charlena Cross, M.D. [facility] of [place] Office: [phone]

## 2011-02-06 NOTE — H&P (Signed)
  NAMELILIAS, [name]              ACCOUNT NO.:  [id number]  MEDICAL RECORD NO.:  [id number]           PATIENT TYPE:  O  LOCATION:  [facility]                          FACILITY:  [facility]  PHYSICIAN:  Joanne Gavel, M.D.        DATE OF BIRTH:  04-24-1945  DATE OF ADMISSION:  07/13/2010 DATE OF DISCHARGE:  07/13/2010                             HISTORY & PHYSICAL   CHIEF COMPLAINT:  Painful foot.  HISTORY OF PRESENT ILLNESS:  This is a 65 year old female, very confused and nonresponsive, with a history of multiple strokes and multiple vascular procedures, particularly of her carotids.  Over the course of several weeks, she has had increasing skin breakdown of the lower extremity and what appears to be rest pain of the foot.  She is completely disoriented to time, place, and person.  She answers questions with answers that are not sensible.  PAST MEDICAL HISTORY:  Significant for diabetes, hypertension, CVAs, neuropathy, peripheral vascular disease, dysphagia, anemia, and hyperlipidemia.  ALLERGIES:  IVP DYE.  MEDICATIONS: 1. Aspirin. 2. Lasix. 3. Pyridium. 4. Ditropan. 5. Hydralazine. 6. Enalapril. 7. Labetalol. 8. Crestor. 9. Desipramine. 10.Nicotine patch. 11.Amlodipine. 12.Ferrous sulfate. 13.Nystatin powder. 14.SSRI. 15.Periostat.  REVIEW OF SYSTEMS:  As above.  PHYSICAL EXAMINATION:  GENERAL:  Awake, alert, disoriented, not responsive to questions verbally. CHEST/HEART:  Clear. ABDOMEN:  On the lower abdomen, there was a 5.9 x 8.9 x 2.3 depth full- thickness wound with surrounding induration, but no fluctuance. EXTREMITIES:  The right leg is pulseless.  Foot is swollen and tender. There is a wound in the thigh, the knee, right lateral leg and foot with extensive tissue damage, full-thickness eschar.  The toes do not move at request and there appears to be lack of sensation.  IMPRESSION: 1. Severe peripheral vascular disease with dry gangrene. 2. Probable rest  pain. 3. Pressure wounds of thigh and lower abdomen.  PLAN:  As per Vascular surgeons, I believe amputation is indicated here, and at the time of amputation probably should have operating room debridement of the wound of the abdominal wall.  In the meantime, we will treat the abdominal wall wound and the thigh wound with Santyl and leave the eschars untouched.  We will see the patient again at the request of Vascular Surgery.     Joanne Gavel, M.D.     RA/MEDQ  D:  07/18/2010  T:  07/19/2010  Job:  [id number]  cc:   Georgann Housekeeper, MD  Electronically Signed by Joanne Gavel M.D. on 02/06/2011 08:53:15 AM

## 2011-02-14 ENCOUNTER — Encounter (HOSPITAL_BASED_OUTPATIENT_CLINIC_OR_DEPARTMENT_OTHER): Payer: Medicare Other | Attending: Plastic Surgery

## 2011-02-14 DIAGNOSIS — E119 Type 2 diabetes mellitus without complications: Secondary | ICD-10-CM | POA: Insufficient documentation

## 2011-02-14 DIAGNOSIS — Z79899 Other long term (current) drug therapy: Secondary | ICD-10-CM | POA: Insufficient documentation

## 2011-02-14 DIAGNOSIS — L97809 Non-pressure chronic ulcer of other part of unspecified lower leg with unspecified severity: Secondary | ICD-10-CM | POA: Insufficient documentation

## 2011-02-14 DIAGNOSIS — Z8673 Personal history of transient ischemic attack (TIA), and cerebral infarction without residual deficits: Secondary | ICD-10-CM | POA: Insufficient documentation

## 2011-02-14 DIAGNOSIS — L98499 Non-pressure chronic ulcer of skin of other sites with unspecified severity: Secondary | ICD-10-CM | POA: Insufficient documentation

## 2011-02-14 DIAGNOSIS — S78119A Complete traumatic amputation at level between unspecified hip and knee, initial encounter: Secondary | ICD-10-CM | POA: Insufficient documentation

## 2011-02-14 DIAGNOSIS — I10 Essential (primary) hypertension: Secondary | ICD-10-CM | POA: Insufficient documentation

## 2011-02-14 DIAGNOSIS — G589 Mononeuropathy, unspecified: Secondary | ICD-10-CM | POA: Insufficient documentation

## 2011-02-14 DIAGNOSIS — I771 Stricture of artery: Secondary | ICD-10-CM | POA: Insufficient documentation

## 2011-02-14 DIAGNOSIS — E785 Hyperlipidemia, unspecified: Secondary | ICD-10-CM | POA: Insufficient documentation

## 2011-03-14 ENCOUNTER — Encounter (HOSPITAL_BASED_OUTPATIENT_CLINIC_OR_DEPARTMENT_OTHER): Payer: Medicare Other

## 2011-03-16 ENCOUNTER — Encounter: Payer: Self-pay | Admitting: Surgery

## 2011-03-19 ENCOUNTER — Ambulatory Visit (INDEPENDENT_AMBULATORY_CARE_PROVIDER_SITE_OTHER): Payer: Medicare Other | Admitting: Surgery

## 2011-03-19 ENCOUNTER — Ambulatory Visit (INDEPENDENT_AMBULATORY_CARE_PROVIDER_SITE_OTHER): Payer: Medicare Other | Admitting: *Deleted

## 2011-03-19 ENCOUNTER — Encounter: Payer: Self-pay | Admitting: Surgery

## 2011-03-19 VITALS — BP 163/76 | HR 60 | Resp 16 | Ht 59.0 in | Wt 160.0 lb

## 2011-03-19 DIAGNOSIS — I739 Peripheral vascular disease, unspecified: Secondary | ICD-10-CM

## 2011-03-19 DIAGNOSIS — L98499 Non-pressure chronic ulcer of skin of other sites with unspecified severity: Secondary | ICD-10-CM

## 2011-03-19 DIAGNOSIS — I6529 Occlusion and stenosis of unspecified carotid artery: Secondary | ICD-10-CM

## 2011-03-19 DIAGNOSIS — Z48812 Encounter for surgical aftercare following surgery on the circulatory system: Secondary | ICD-10-CM

## 2011-03-19 NOTE — Progress Notes (Signed)
Vascular and Vein Specialist of [facility]   Patient name: [name] MRN: [id number] DOB: April 04, 1946 Sex: female     Chief Complaint  Patient presents with  . 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 They discharged her back in October with a healed wound.  She also is wearing an EHOB boots to the left leg to protect her heel. She has not had any neurologic symptoms she had a stent in her carotid artery placed by Dr. Corliss Skains in the setting of symptomatic carotid stenosis. She has not had a followup for this. She has no complaints at this time   Past Medical History  Diagnosis Date  . Diabetes mellitus   . Hypertension   . Stroke   . Hyperlipidemia   . Anemia   . CAD (coronary artery disease)   . Arthritis   . Ulcer   . Hypernatremia   . Hypokalemia   . Carotid artery occlusion     Past Surgical History  Procedure Date  . Above knee leg amputation     RIGHT AKA  . Carotid endarterectomy     History   Social History  . Marital Status: Divorced    Spouse Name: N/A    Number of Children: N/A  . Years of Education: N/A   Occupational History  . Not on file.   Social History Main Topics  . Smoking status: Former Smoker -- 1.0 packs/day for 41 years    Types: Cigarettes    Quit date: 04/16/2010  . Smokeless tobacco: Not on file  . Alcohol Use: No  . Drug Use: No  . Sexually Active: Not on file   Other Topics Concern  . Not on file   Social History Narrative  . No narrative on file    Family History  Problem Relation Age of Onset  . Diabetes Mother   . Stroke Mother   . Cancer Father     colon  . Diabetes Sister   . Coronary artery disease Sister   . Diabetes Brother   . Coronary artery disease Brother     Allergies as of 03/19/2011 - Review  Complete 03/19/2011  Allergen Reaction Noted  . Iohexol  05/02/2010  . Latex  12/01/2010    Current Outpatient Prescriptions on File Prior to Visit  Medication Sig Dispense Refill  . acetaminophen (TYLENOL) 325 MG tablet Take 650 mg by mouth every 6 (six) hours as needed.        Marland Kitchen albuterol (PROVENTIL) (2.5 MG/3ML) 0.083% nebulizer solution Take 2.5 mg by nebulization 2 (two) times daily.        Marland Kitchen amLODipine (NORVASC) 10 MG tablet Take 10 mg by mouth daily.        Marland Kitchen aspirin 325 MG EC tablet Take 325 mg by mouth daily.        . chlorhexidine (PERIDEX) 0.12 % solution Use as directed 15 mLs in the mouth or throat 2 (two) times daily.        . Cholecalciferol (VITAMIN D3) 50000 UNITS CAPS Take 1 capsule by mouth once a week.        . clopidogrel (PLAVIX) 75 MG tablet Take 75 mg by mouth daily.        . enalapril (VASOTEC)  10 MG tablet Take 10 mg by mouth daily.        Marland Kitchen escitalopram (LEXAPRO) 20 MG tablet Take 20 mg by mouth daily.        . fentaNYL (DURAGESIC - DOSED MCG/HR) 25 MCG/HR Place 1 patch onto the skin every 3 (three) days.        . ferrous sulfate 325 (65 FE) MG tablet Take 325 mg by mouth 2 (two) times daily.       . fluticasone (FLONASE) 50 MCG/ACT nasal spray Place 2 sprays into the nose daily.        . hydrALAZINE (APRESOLINE) 50 MG tablet Take 50 mg by mouth 4 (four) times daily.        Marland Kitchen labetalol (NORMODYNE) 300 MG tablet Take 300 mg by mouth. TAKE 1/2 TABLET every 8 hrs      . lidocaine (LIDODERM) 5 % Place 1 patch onto the skin daily. Remove & Discard patch within 12 hours or as directed by MD       . Multiple Vitamin (MULTIVITAMIN) tablet Take 1 tablet by mouth daily.        . Multiple Vitamins-Minerals (DECUBI-VITE) CAPS Take 1 capsule by mouth daily.        Marland Kitchen oxybutynin (DITROPAN-XL) 5 MG 24 hr tablet Take 5 mg by mouth daily.        Marland Kitchen oxyCODONE-acetaminophen (PERCOCET) 5-325 MG per tablet Take 1 tablet by mouth every 4 (four) hours as needed.        . Pollen Extracts  (PROSTAT PO) Take by mouth at bedtime.        . polyethylene glycol powder (MIRALAX) powder Take 17 g by mouth daily as needed.        . pravastatin (PRAVACHOL) 80 MG tablet Take 80 mg by mouth daily.        . pregabalin (LYRICA) 50 MG capsule Take 50 mg by mouth 3 (three) times daily.          REVIEW OF SYSTEMS: No changes from prior visit  PHYSICAL EXAMINATION:   Vital signs are BP 163/76  Pulse 60  Resp 16  Ht 4\' 11"  (1.499 m)  Wt 160 lb (72.576 kg)  BMI 32.32 kg/m2  SpO2 91% General: The patient appears their stated age. HEENT:  No gross abnormalities Pulmonary:  Non labored breathing Abdomen: Soft and non-tender Musculoskeletal: The right above-knee amputation has fully healed Skin: There are no ulcer or rashes noted. The abdominal wound is completely healed. The left heel is without wound Psychiatric: The patient has normal affect. Cardiovascular: There is a regular rate and rhythm without significant murmur appreciated.   Diagnostic Studies Ultrasound of her carotid was ordered and reviewed today. This shows a widely patent right internal carotid artery and widely patent left carotid stent  Assessment: Status post right leg amputation Status post left carotid stent Plan: All the patient's wounds have healed. She will continue to wear her on her left leg to prevent decubitus on her heel. Her carotid arterial system is widely patent now repeat her ultrasound in one year. I'll see her back at that time  V. Charlena Cross, M.D. [facility] Ve[facility]ists of [place] Office: [phone] Pager:  [phone]

## 2011-04-02 NOTE — Procedures (Unsigned)
CAROTID DUPLEX EXAM  INDICATION:  Left carotid stent.  HISTORY: Diabetes:  No. Cardiac:  No. Hypertension:  Yes. Smoking:  No. Previous Surgery:  Left carotid endarterectomy in 2003, left carotid stent on 02/22/2009. CV History: Amaurosis Fugax No, Paresthesias No, Hemiparesis No.                                      RIGHT             LEFT Brachial systolic pressure: Brachial Doppler waveforms: Vertebral direction of flow:        Antegrade         Antegrade DUPLEX VELOCITIES (cm/sec) CCA peak systolic                   96                62 ECA peak systolic                   110               91 ICA peak systolic                   101               103 ICA end diastolic                   16                18 PLAQUE MORPHOLOGY:                  Mixed             Heterogenous PLAQUE AMOUNT:                      Moderate          Moderate PLAQUE LOCATION:                    ICA/ECA/CCA       ECA/CCA  IMPRESSION: 1. No hemodynamically significant stenosis of the right internal     carotid artery noted. 2. Patent left carotid artery stent with no left internal carotid     artery stenosis noted. 3. Left external carotid artery stenosis noted. 4. Significant improvement of the left internal carotid artery     velocities noted when compared to the previous examination on     02/21/2009.  ___________________________________________ V. Charlena Cross, MD  CH/MEDQ  D:  03/20/2011  T:  03/20/2011  Job:  [id number]

## 2012-02-08 IMAGING — CR DG ABD PORTABLE 1V
1 series · 1 of 1 positions shown · non-contrast
Comparison: None.

CLINICAL DATA: Feeding tube placement

ABDOMEN - 1 VIEW

[view not recorded]
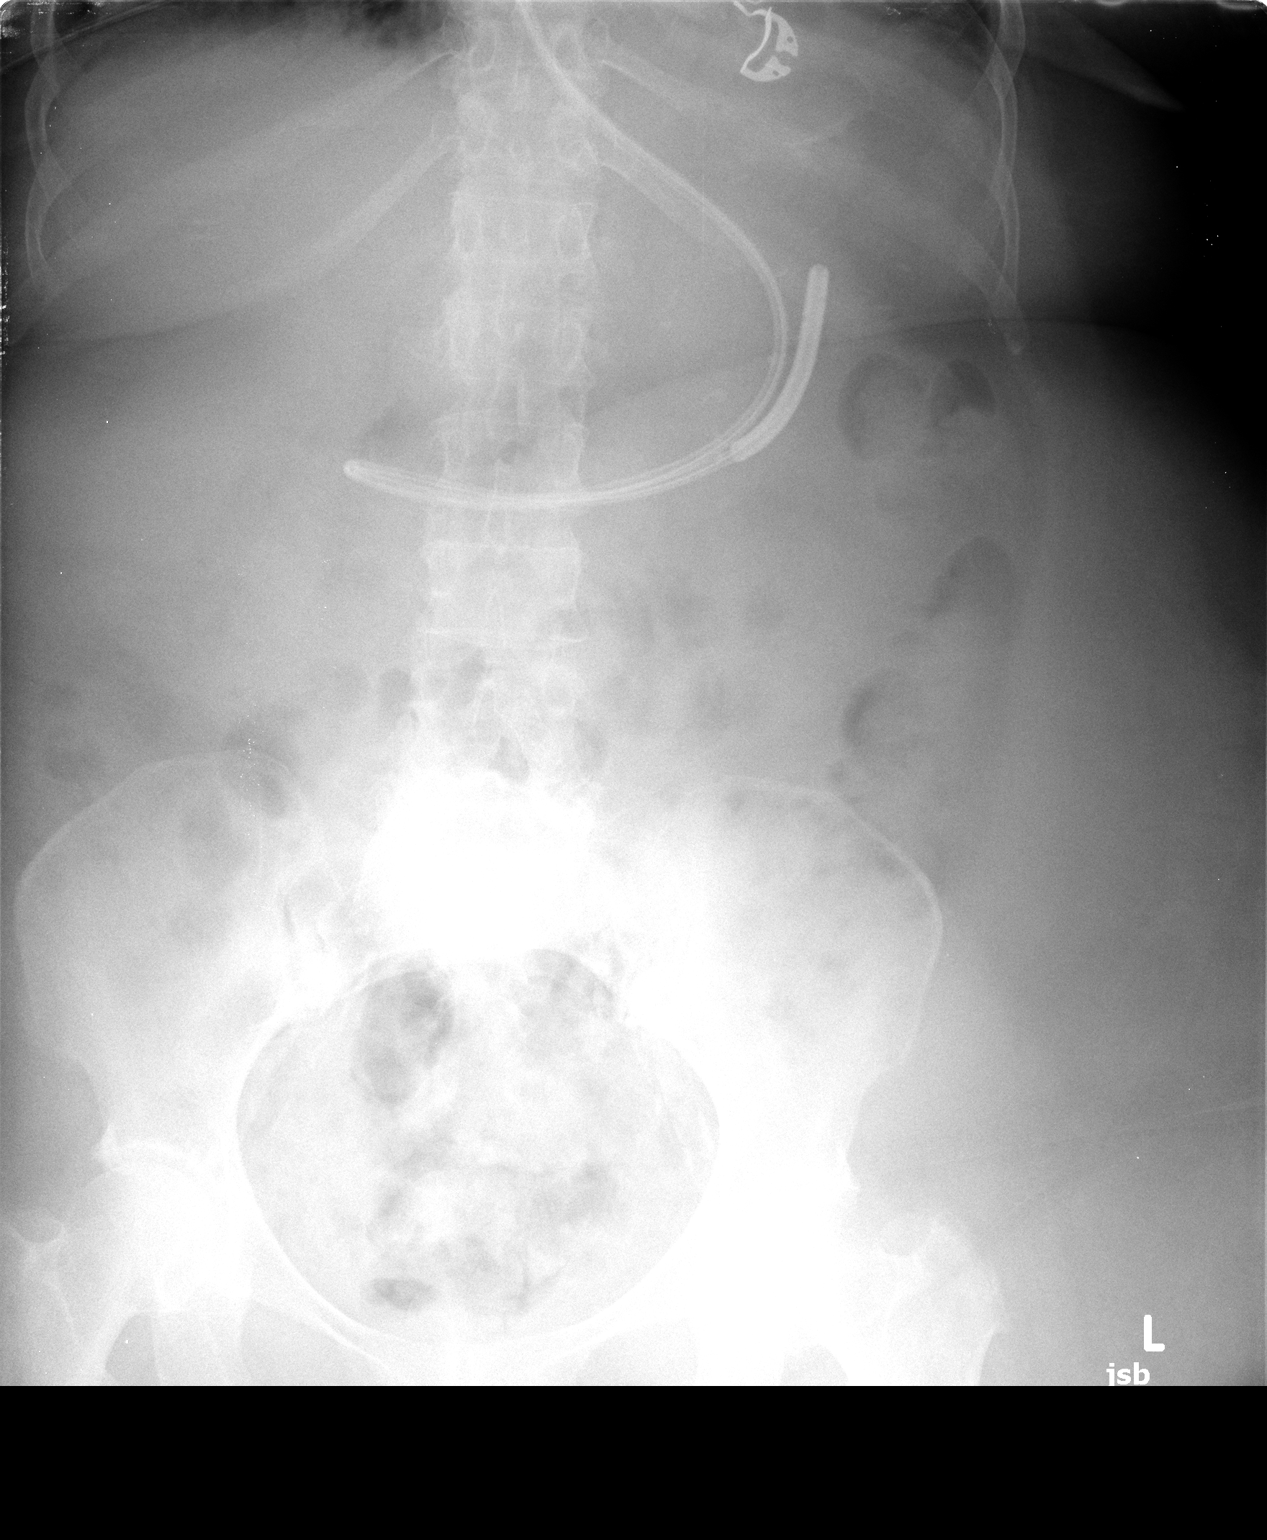

[1 of 1 positions shown; findings below may reference images not displayed]

FINDINGS: The feeding tube is coiled in the stomach.  Tip is in the
fundus.
IMPRESSION: Feeding tube tip is in the fundus of the stomach.

## 2012-02-08 IMAGING — CR DG CHEST 1V PORT
1 series · 1 of 1 positions shown · non-contrast
Comparison: 06/01/2010

CLINICAL DATA: Stroke.

PORTABLE CHEST - 1 VIEW

[view not recorded]
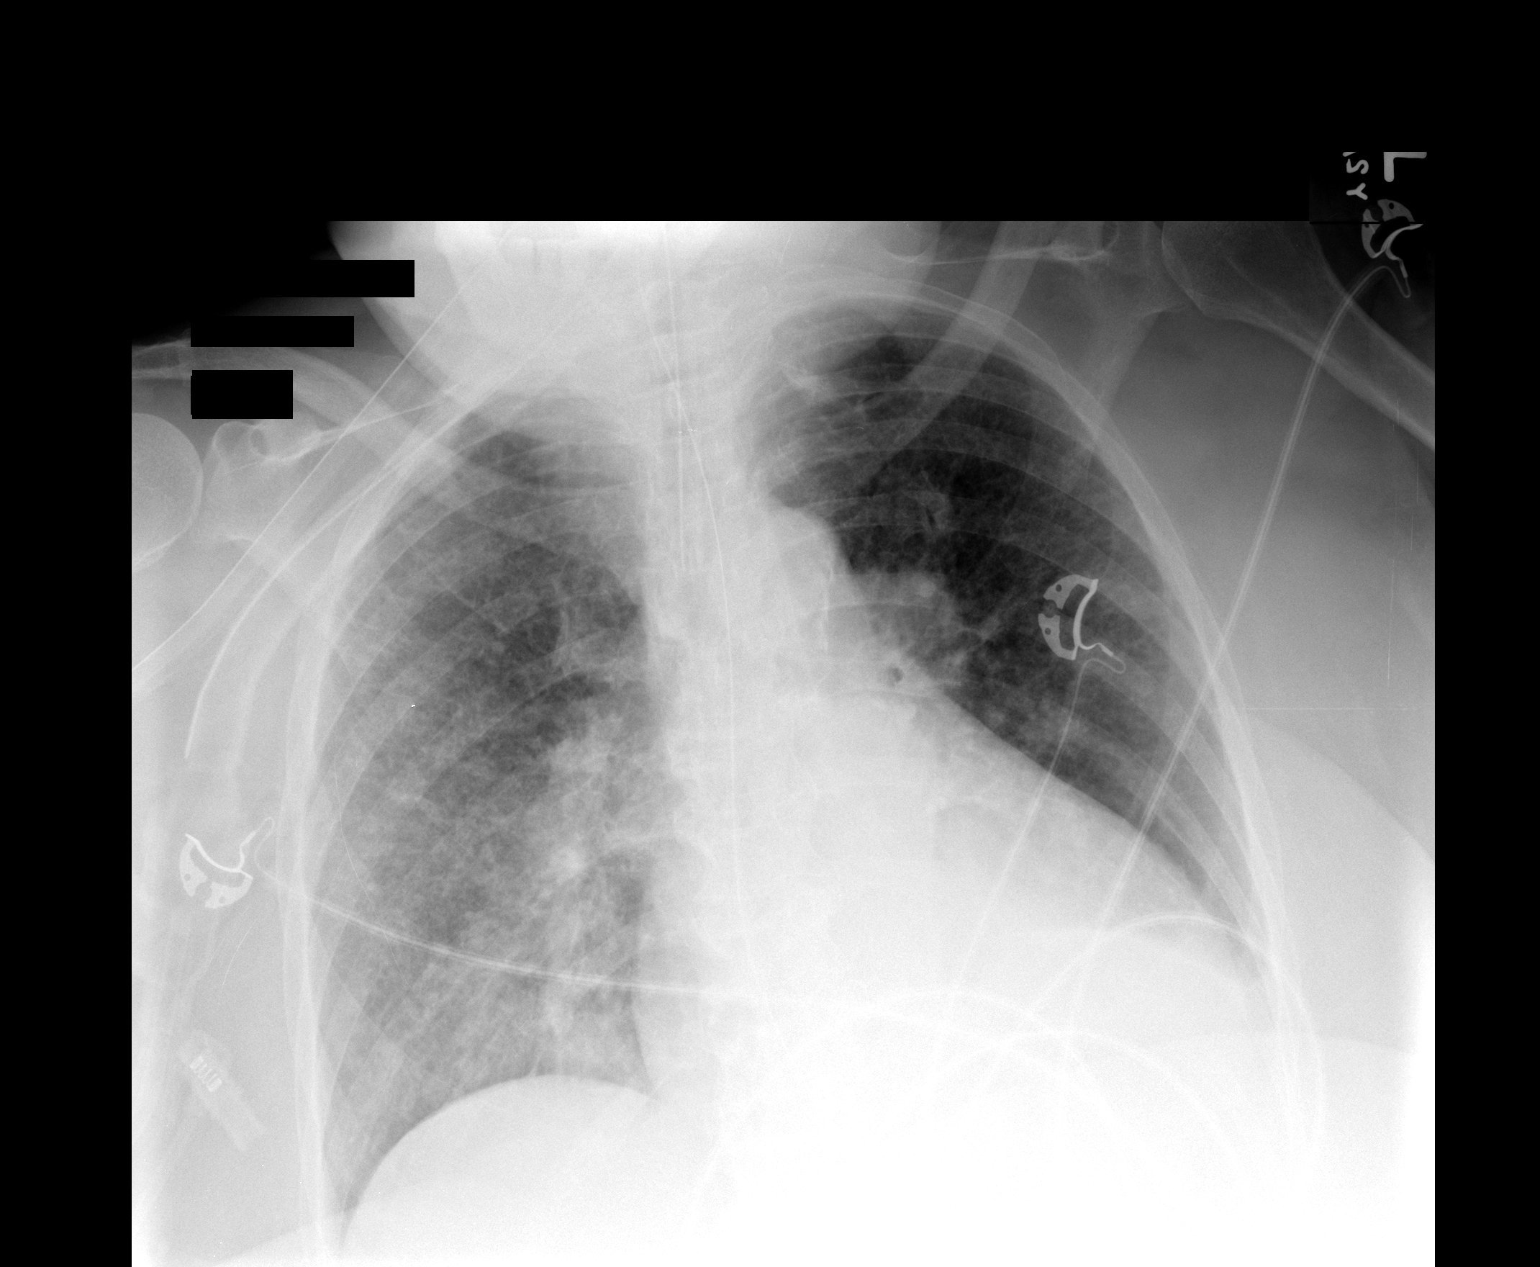

[1 of 1 positions shown; findings below may reference images not displayed]

FINDINGS: Endotracheal tube is in place with tip 2.2 cm above
carina.  Nasogastric tube is in place with tip at least the level
of stomach and not visualized.  The heart is enlarged.  There are
prominent interstitial markings with little change.  No focal
atelectasis or consolidation.  Degenerative changes are seen in the
spine.
IMPRESSION: Little interval change.

## 2012-02-10 IMAGING — CR DG CHEST 1V PORT
1 series · 1 of 1 positions shown · non-contrast
Comparison: 06/03/2010

CLINICAL DATA: Unresponsive

PORTABLE CHEST - 1 VIEW

[AP]
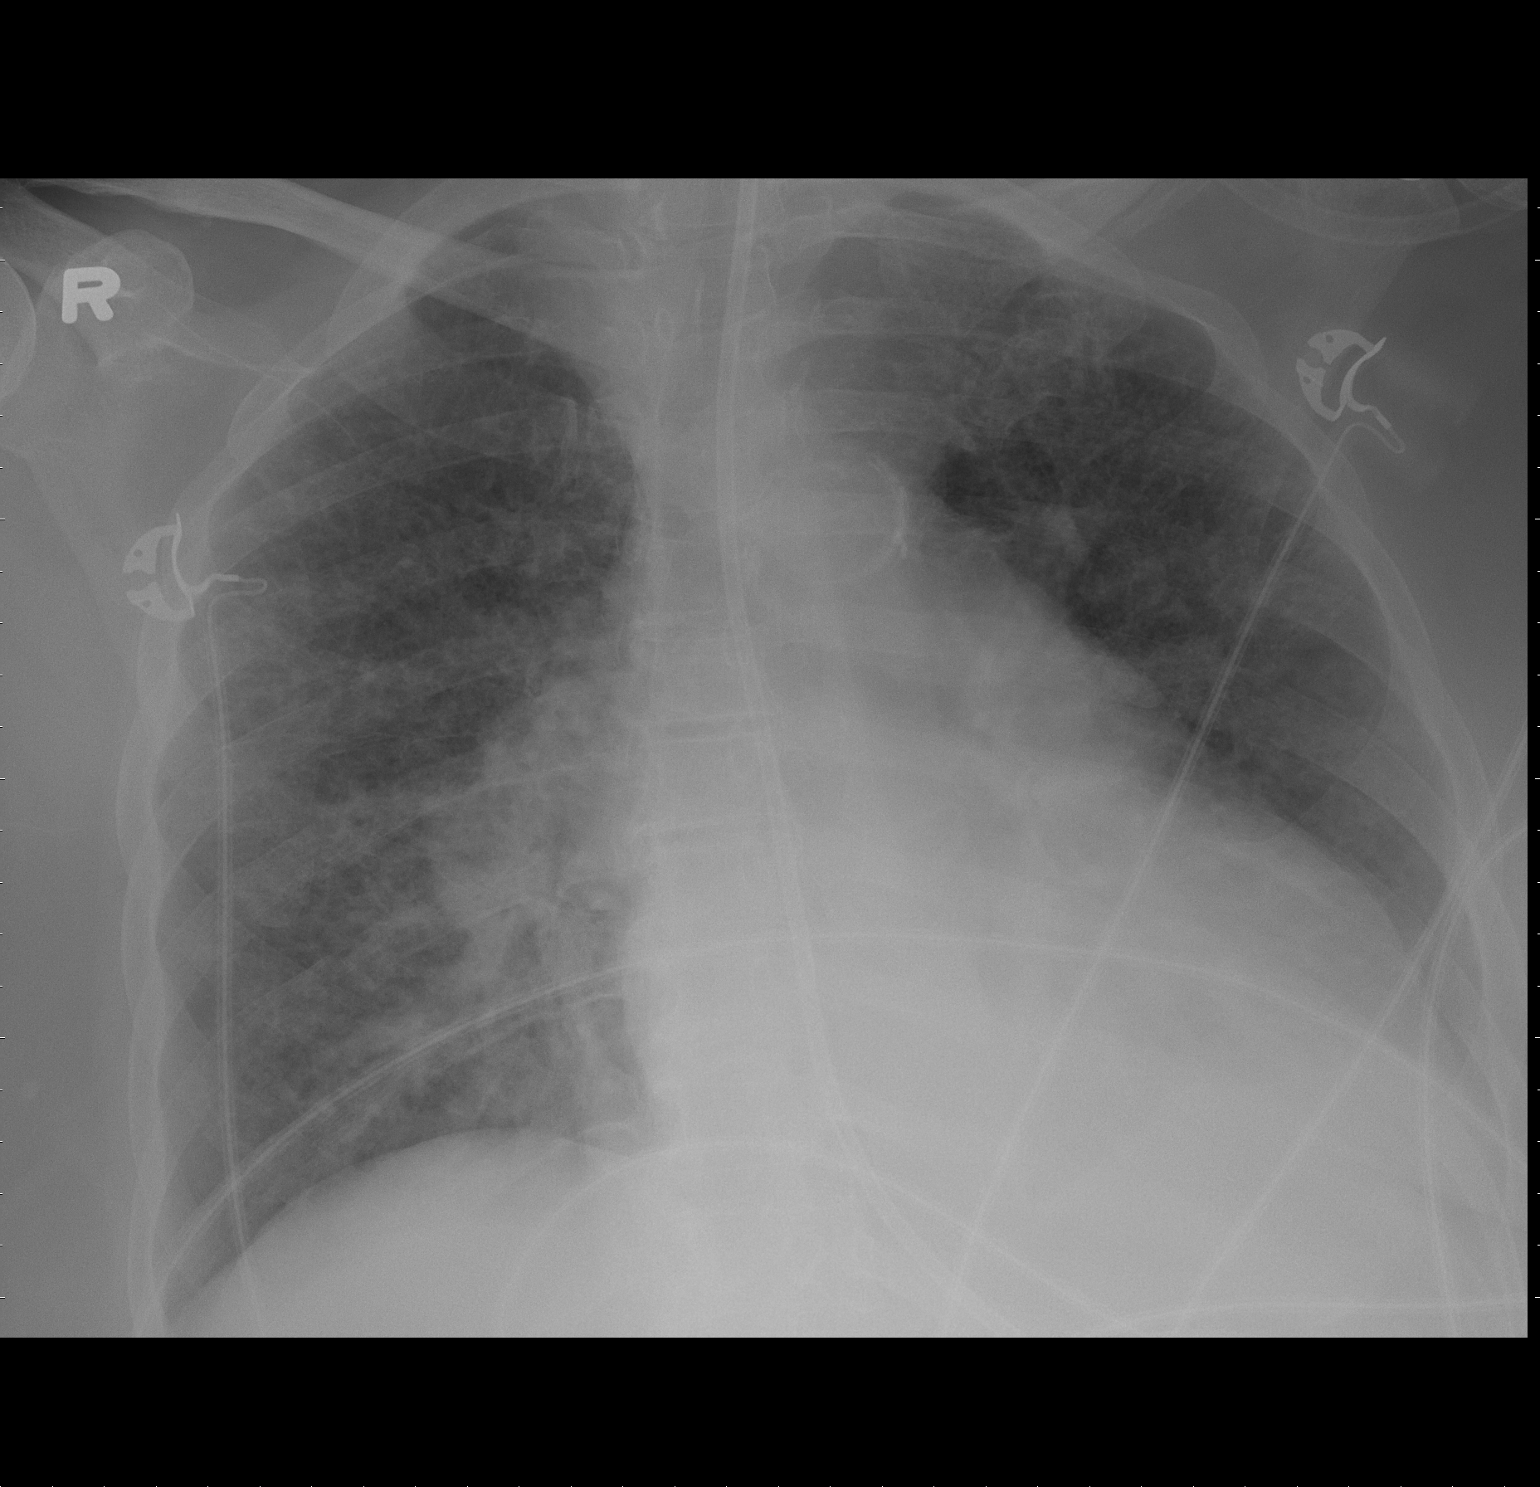

[1 of 1 positions shown; findings below may reference images not displayed]

FINDINGS: Feeding tube tip terminates below the level of the
diaphragms but is not included in the field of view.  Moderate
enlargement of the cardiomediastinal silhouette is stable.  Diffuse
minimal patchy airspace opacities are noted with trace effusions.
IMPRESSION: No significant change in minimal patchy bilateral airspace
opacities, which is nonspecific and could be seen with edema,
infection, or other alveolar filling processes.

## 2012-02-11 IMAGING — CR DG CHEST 1V PORT
1 series · 1 of 1 positions shown · non-contrast
Comparison: Chest 06/04/2010.

CLINICAL DATA: Stroke.

PORTABLE CHEST - 1 VIEW

[view not recorded]
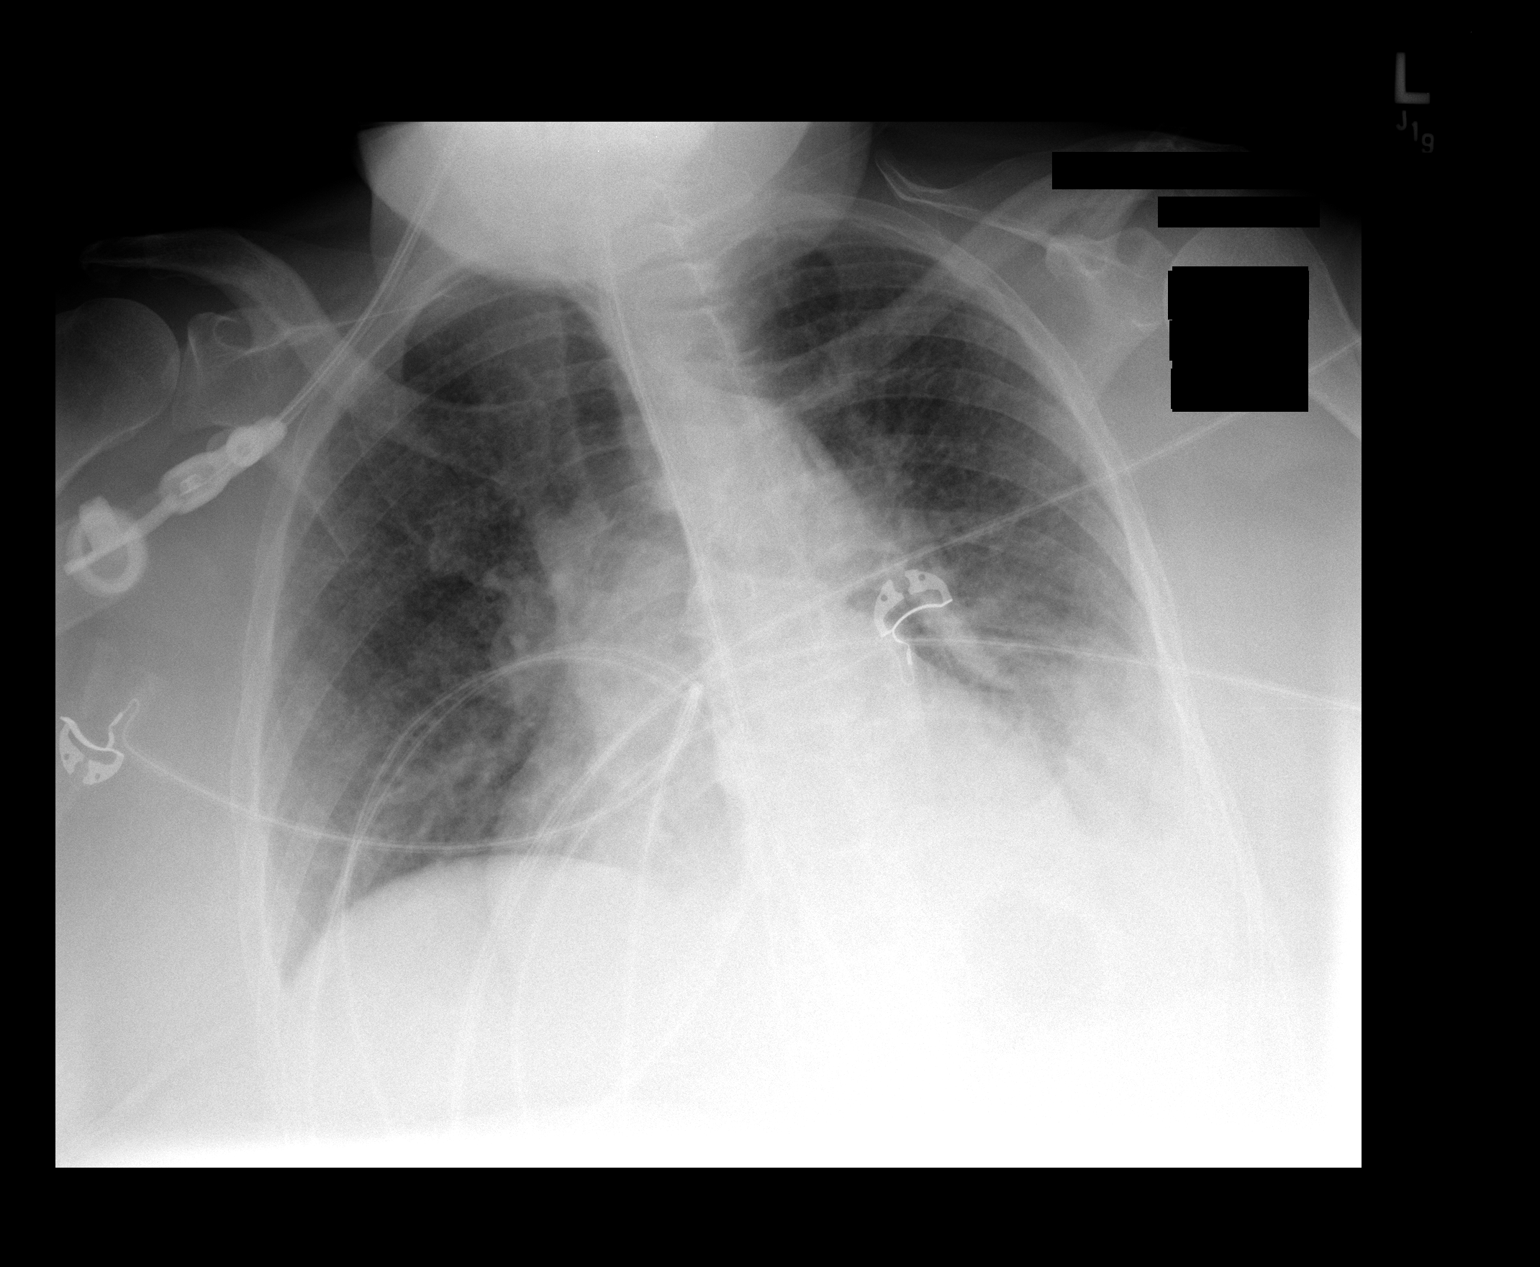

[1 of 1 positions shown; findings below may reference images not displayed]

FINDINGS: Feeding tube remains in place.  Left basilar airspace
disease is unchanged.  Mild interstitial edema.  Cardiomegaly.
IMPRESSION: No change in left basilar airspace disease and mild interstitial
edema.

## 2012-02-11 IMAGING — CR DG ABD PORTABLE 1V
1 series · 1 of 1 positions shown · non-contrast
Comparison: 06/02/2010.

CLINICAL DATA: Feeding tube placement.

ABDOMEN - 1 VIEW

[view not recorded]
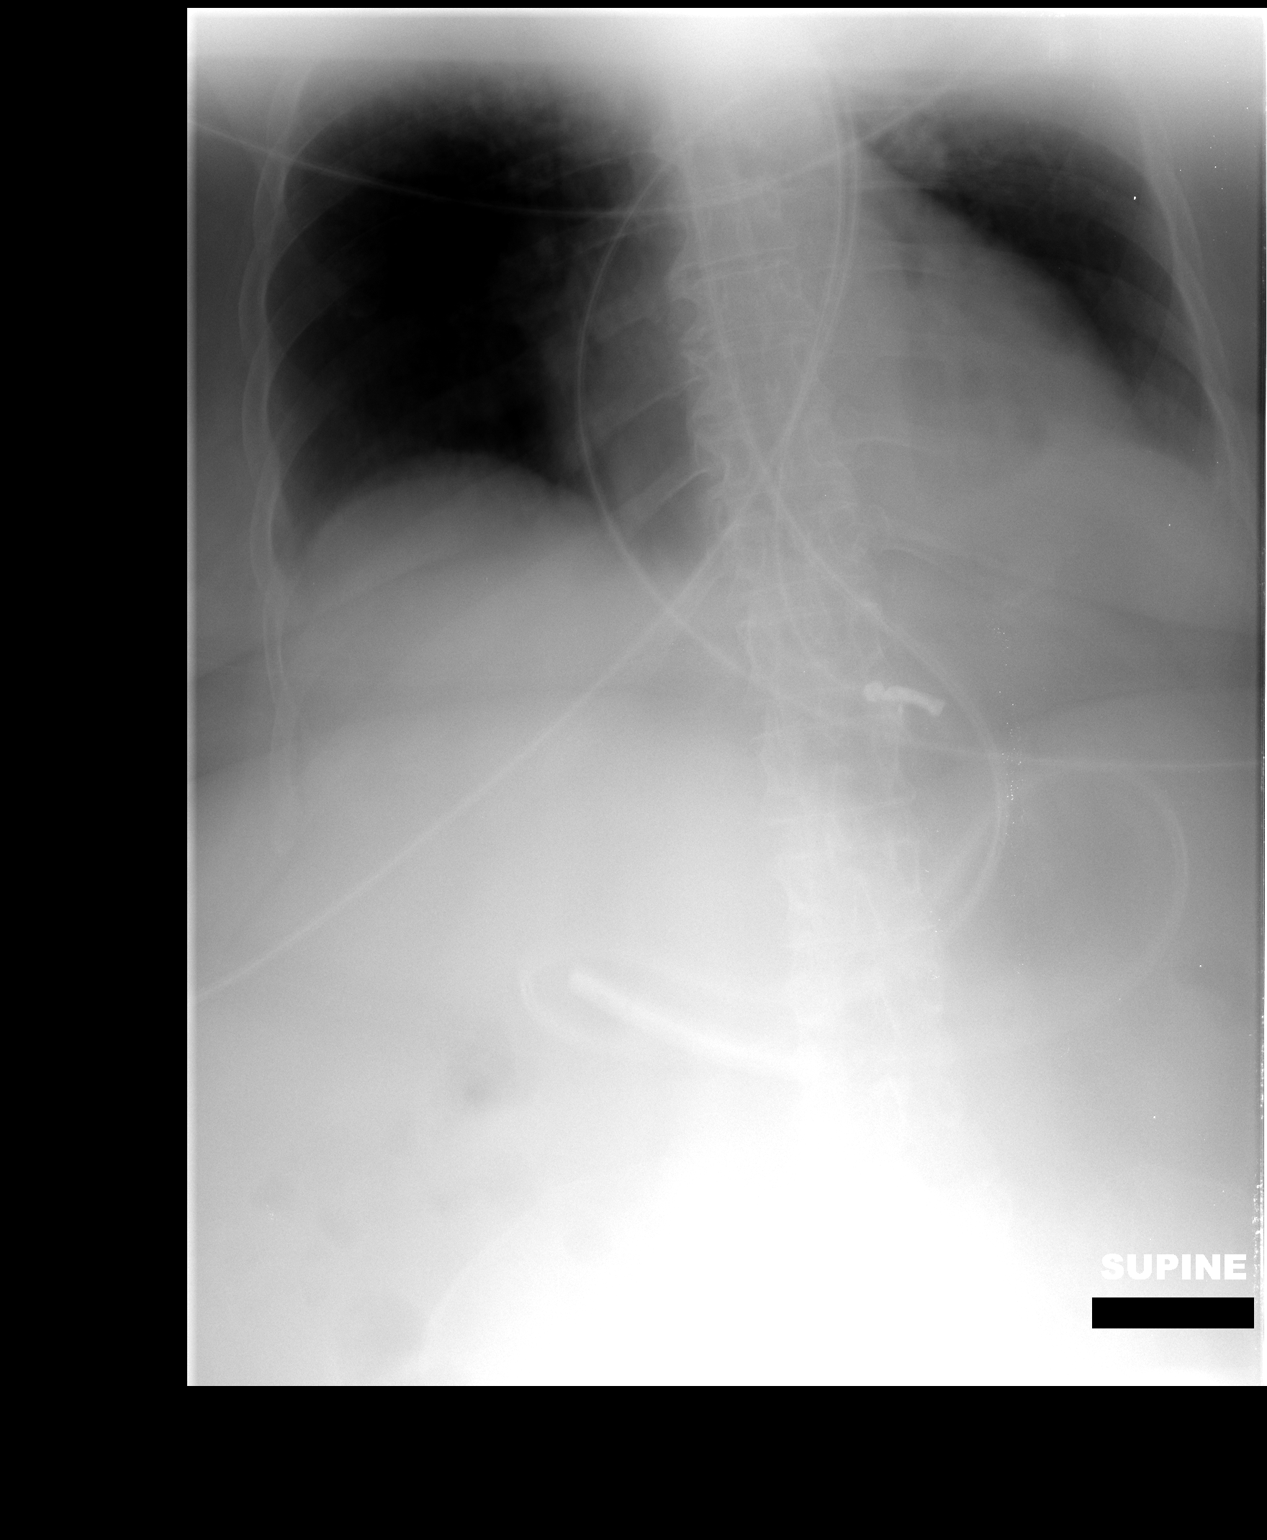

[1 of 1 positions shown; findings below may reference images not displayed]

FINDINGS: An additional loop of tubing is present over the abdomen.
The tip appears be within the antrum of the stomach.  Minimal
atelectasis is present at the left lung base.  The heart is mildly
enlarged.
IMPRESSION: 1.  The tip of a small bore feeding tube is in the antrum of the
stomach.
2.  An additional loop of feeding tube has been placed.

## 2012-03-21 ENCOUNTER — Encounter: Payer: Self-pay | Admitting: Neurosurgery

## 2012-03-21 ENCOUNTER — Other Ambulatory Visit: Payer: Self-pay | Admitting: *Deleted

## 2012-03-21 DIAGNOSIS — I6529 Occlusion and stenosis of unspecified carotid artery: Secondary | ICD-10-CM

## 2012-03-21 DIAGNOSIS — Z48812 Encounter for surgical aftercare following surgery on the circulatory system: Secondary | ICD-10-CM

## 2012-03-24 ENCOUNTER — Other Ambulatory Visit: Payer: Medicare Other

## 2012-03-24 ENCOUNTER — Ambulatory Visit: Payer: Medicare Other | Admitting: Neurosurgery

## 2012-05-02 ENCOUNTER — Encounter: Payer: Self-pay | Admitting: Neurosurgery

## 2012-05-05 ENCOUNTER — Ambulatory Visit: Payer: Medicare Other | Admitting: Neurosurgery

## 2012-05-05 ENCOUNTER — Other Ambulatory Visit: Payer: Medicare Other

## 2012-11-19 ENCOUNTER — Encounter (HOSPITAL_BASED_OUTPATIENT_CLINIC_OR_DEPARTMENT_OTHER): Payer: Medicare Other

## 2013-02-18 ENCOUNTER — Encounter: Payer: Self-pay | Admitting: *Deleted

## 2013-02-19 ENCOUNTER — Encounter: Payer: Self-pay | Admitting: Cardiovascular Disease

## 2013-02-19 ENCOUNTER — Ambulatory Visit: Payer: Medicare Other | Admitting: Cardiovascular Disease

## 2013-09-14 ENCOUNTER — Encounter: Payer: Self-pay | Admitting: Cardiology

## 2013-09-16 ENCOUNTER — Telehealth: Payer: Self-pay | Admitting: Cardiovascular Disease

## 2013-09-16 NOTE — Telephone Encounter (Signed)
09-16-13 sent certified letter re past due device ck/mt ° °

## 2013-11-12 ENCOUNTER — Inpatient Hospital Stay (HOSPITAL_COMMUNITY)
Admission: EM | Admit: 2013-11-12 | Discharge: 2013-11-17 | DRG: 177 | Disposition: A | Discharge status: Skilled Nursing Facility | Payer: PRIVATE HEALTH INSURANCE | Attending: Internal Medicine | Admitting: Internal Medicine

## 2013-11-12 ENCOUNTER — Emergency Department (HOSPITAL_COMMUNITY): Payer: PRIVATE HEALTH INSURANCE

## 2013-11-12 ENCOUNTER — Inpatient Hospital Stay (HOSPITAL_COMMUNITY): Payer: PRIVATE HEALTH INSURANCE

## 2013-11-12 ENCOUNTER — Encounter (HOSPITAL_COMMUNITY): Payer: Self-pay | Admitting: Emergency Medicine

## 2013-11-12 DIAGNOSIS — Z950 Presence of cardiac pacemaker: Secondary | ICD-10-CM

## 2013-11-12 DIAGNOSIS — I6529 Occlusion and stenosis of unspecified carotid artery: Secondary | ICD-10-CM

## 2013-11-12 DIAGNOSIS — J690 Pneumonitis due to inhalation of food and vomit: Principal | ICD-10-CM | POA: Diagnosis present

## 2013-11-12 DIAGNOSIS — H409 Unspecified glaucoma: Secondary | ICD-10-CM

## 2013-11-12 DIAGNOSIS — E785 Hyperlipidemia, unspecified: Secondary | ICD-10-CM | POA: Diagnosis present

## 2013-11-12 DIAGNOSIS — E049 Nontoxic goiter, unspecified: Secondary | ICD-10-CM

## 2013-11-12 DIAGNOSIS — I739 Peripheral vascular disease, unspecified: Secondary | ICD-10-CM | POA: Diagnosis present

## 2013-11-12 DIAGNOSIS — E1149 Type 2 diabetes mellitus with other diabetic neurological complication: Secondary | ICD-10-CM | POA: Diagnosis present

## 2013-11-12 DIAGNOSIS — E669 Obesity, unspecified: Secondary | ICD-10-CM

## 2013-11-12 DIAGNOSIS — S78119A Complete traumatic amputation at level between unspecified hip and knee, initial encounter: Secondary | ICD-10-CM | POA: Diagnosis not present

## 2013-11-12 DIAGNOSIS — E119 Type 2 diabetes mellitus without complications: Secondary | ICD-10-CM

## 2013-11-12 DIAGNOSIS — R609 Edema, unspecified: Secondary | ICD-10-CM

## 2013-11-12 DIAGNOSIS — E1129 Type 2 diabetes mellitus with other diabetic kidney complication: Secondary | ICD-10-CM | POA: Diagnosis present

## 2013-11-12 DIAGNOSIS — I5033 Acute on chronic diastolic (congestive) heart failure: Secondary | ICD-10-CM | POA: Diagnosis present

## 2013-11-12 DIAGNOSIS — Z8673 Personal history of transient ischemic attack (TIA), and cerebral infarction without residual deficits: Secondary | ICD-10-CM

## 2013-11-12 DIAGNOSIS — Z79899 Other long term (current) drug therapy: Secondary | ICD-10-CM | POA: Diagnosis not present

## 2013-11-12 DIAGNOSIS — F0390 Unspecified dementia without behavioral disturbance: Secondary | ICD-10-CM | POA: Diagnosis present

## 2013-11-12 DIAGNOSIS — D638 Anemia in other chronic diseases classified elsewhere: Secondary | ICD-10-CM | POA: Diagnosis present

## 2013-11-12 DIAGNOSIS — K922 Gastrointestinal hemorrhage, unspecified: Secondary | ICD-10-CM | POA: Diagnosis present

## 2013-11-12 DIAGNOSIS — E870 Hyperosmolality and hypernatremia: Secondary | ICD-10-CM | POA: Diagnosis present

## 2013-11-12 DIAGNOSIS — J9601 Acute respiratory failure with hypoxia: Secondary | ICD-10-CM

## 2013-11-12 DIAGNOSIS — R0902 Hypoxemia: Secondary | ICD-10-CM

## 2013-11-12 DIAGNOSIS — I2510 Atherosclerotic heart disease of native coronary artery without angina pectoris: Secondary | ICD-10-CM | POA: Diagnosis present

## 2013-11-12 DIAGNOSIS — J9600 Acute respiratory failure, unspecified whether with hypoxia or hypercapnia: Secondary | ICD-10-CM | POA: Diagnosis present

## 2013-11-12 DIAGNOSIS — E042 Nontoxic multinodular goiter: Secondary | ICD-10-CM

## 2013-11-12 DIAGNOSIS — E1139 Type 2 diabetes mellitus with other diabetic ophthalmic complication: Secondary | ICD-10-CM

## 2013-11-12 DIAGNOSIS — Z8249 Family history of ischemic heart disease and other diseases of the circulatory system: Secondary | ICD-10-CM | POA: Diagnosis not present

## 2013-11-12 DIAGNOSIS — J9621 Acute and chronic respiratory failure with hypoxia: Secondary | ICD-10-CM | POA: Diagnosis present

## 2013-11-12 DIAGNOSIS — J90 Pleural effusion, not elsewhere classified: Secondary | ICD-10-CM

## 2013-11-12 DIAGNOSIS — J918 Pleural effusion in other conditions classified elsewhere: Secondary | ICD-10-CM

## 2013-11-12 DIAGNOSIS — I509 Heart failure, unspecified: Secondary | ICD-10-CM | POA: Diagnosis present

## 2013-11-12 DIAGNOSIS — Z87891 Personal history of nicotine dependence: Secondary | ICD-10-CM | POA: Diagnosis not present

## 2013-11-12 DIAGNOSIS — Z794 Long term (current) use of insulin: Secondary | ICD-10-CM | POA: Diagnosis not present

## 2013-11-12 DIAGNOSIS — G8929 Other chronic pain: Secondary | ICD-10-CM

## 2013-11-12 DIAGNOSIS — R82998 Other abnormal findings in urine: Secondary | ICD-10-CM

## 2013-11-12 DIAGNOSIS — F17200 Nicotine dependence, unspecified, uncomplicated: Secondary | ICD-10-CM

## 2013-11-12 DIAGNOSIS — E1142 Type 2 diabetes mellitus with diabetic polyneuropathy: Secondary | ICD-10-CM | POA: Diagnosis present

## 2013-11-12 DIAGNOSIS — I10 Essential (primary) hypertension: Secondary | ICD-10-CM | POA: Diagnosis present

## 2013-11-12 DIAGNOSIS — Z8679 Personal history of other diseases of the circulatory system: Secondary | ICD-10-CM

## 2013-11-12 DIAGNOSIS — R32 Unspecified urinary incontinence: Secondary | ICD-10-CM

## 2013-11-12 DIAGNOSIS — J189 Pneumonia, unspecified organism: Secondary | ICD-10-CM | POA: Diagnosis present

## 2013-11-12 DIAGNOSIS — Z823 Family history of stroke: Secondary | ICD-10-CM

## 2013-11-12 LAB — LACTATE DEHYDROGENASE, PLEURAL OR PERITONEAL FLUID: LD, Fluid: 124 U/L — ABNORMAL HIGH (ref 3–23)

## 2013-11-12 LAB — BASIC METABOLIC PANEL
Anion gap: 14 (ref 5–15)
BUN: 26 mg/dL — AB (ref 6–23)
CO2: 20 mEq/L (ref 19–32)
CREATININE: 0.68 mg/dL (ref 0.50–1.10)
Calcium: 8.7 mg/dL (ref 8.4–10.5)
Chloride: 113 mEq/L — ABNORMAL HIGH (ref 96–112)
GFR calc Af Amer: >90 mL/min (ref >90)
GFR, EST NON AFRICAN AMERICAN: 87 mL/min — AB (ref >90)
Glucose, Bld: 95 mg/dL (ref 70–99)
Potassium: 4.3 mEq/L (ref 3.7–5.3)
Sodium: 147 mEq/L (ref 137–147)

## 2013-11-12 LAB — BODY FLUID CELL COUNT WITH DIFFERENTIAL
LYMPHS FL: 74 %
Monocyte-Macrophage-Serous Fluid: 20 % — ABNORMAL LOW (ref 50–90)
Neutrophil Count, Fluid: 6 % (ref 0–25)
Total Nucleated Cell Count, Fluid: 3184 cu mm — ABNORMAL HIGH (ref 0–1000)

## 2013-11-12 LAB — CBC WITH DIFFERENTIAL/PLATELET
BASOS ABS: 0 10*3/uL (ref 0.0–0.1)
BASOS PCT: 0 % (ref 0–1)
Eosinophils Absolute: 0 10*3/uL (ref 0.0–0.7)
Eosinophils Relative: 0 % (ref 0–5)
HEMATOCRIT: 30.2 % — AB (ref 36.0–46.0)
Hemoglobin: 9.4 g/dL — ABNORMAL LOW (ref 12.0–15.0)
Lymphocytes Relative: 7 % — ABNORMAL LOW (ref 12–46)
Lymphs Abs: 0.8 10*3/uL (ref 0.7–4.0)
MCH: 28.1 pg (ref 26.0–34.0)
MCHC: 31.1 g/dL (ref 30.0–36.0)
MCV: 90.4 fL (ref 78.0–100.0)
MONO ABS: 0.4 10*3/uL (ref 0.1–1.0)
MONOS PCT: 3 % (ref 3–12)
NEUTROS PCT: 90 % — AB (ref 43–77)
Neutro Abs: 10.5 10*3/uL — ABNORMAL HIGH (ref 1.7–7.7)
Platelets: 303 10*3/uL (ref 150–400)
RBC: 3.34 MIL/uL — ABNORMAL LOW (ref 3.87–5.11)
RDW: 14.9 % (ref 11.5–15.5)
WBC: 11.8 10*3/uL — ABNORMAL HIGH (ref 4.0–10.5)

## 2013-11-12 LAB — I-STAT ARTERIAL BLOOD GAS, ED
ACID-BASE DEFICIT: 4 mmol/L — AB (ref 0.0–2.0)
BICARBONATE: 20.6 meq/L (ref 20.0–24.0)
O2 SAT: 81 %
PH ART: 7.369 (ref 7.350–7.450)
Patient temperature: 97.7
TCO2: 22 mmol/L (ref 0–100)
pCO2 arterial: 35.5 mmHg (ref 35.0–45.0)
pO2, Arterial: 45 mmHg — ABNORMAL LOW (ref 80.0–100.0)

## 2013-11-12 LAB — CHOLESTEROL, TOTAL: Cholesterol: 145 mg/dL (ref 0–200)

## 2013-11-12 LAB — TSH: TSH: 0.699 u[IU]/mL (ref 0.350–4.500)

## 2013-11-12 LAB — MRSA PCR SCREENING: MRSA by PCR: POSITIVE — AB

## 2013-11-12 LAB — PROTEIN, BODY FLUID: TOTAL PROTEIN, FLUID: 3.1 g/dL

## 2013-11-12 LAB — LACTATE DEHYDROGENASE: LDH: 254 U/L — ABNORMAL HIGH (ref 94–250)

## 2013-11-12 LAB — TROPONIN I
Troponin I: <0.3 ng/mL (ref <0.30)
Troponin I: <0.3 ng/mL (ref <0.30)
Troponin I: <0.3 ng/mL (ref <0.30)

## 2013-11-12 LAB — GLUCOSE, CAPILLARY
Glucose-Capillary: 126 mg/dL — ABNORMAL HIGH (ref 70–99)
Glucose-Capillary: 145 mg/dL — ABNORMAL HIGH (ref 70–99)

## 2013-11-12 LAB — D-DIMER, QUANTITATIVE (NOT AT ARMC): D DIMER QUANT: 5.24 ug{FEU}/mL — AB (ref 0.00–0.48)

## 2013-11-12 LAB — PROTIME-INR
INR: 1.07 (ref 0.00–1.49)
Prothrombin Time: 13.9 seconds (ref 11.6–15.2)

## 2013-11-12 LAB — PRO B NATRIURETIC PEPTIDE: Pro B Natriuretic peptide (BNP): 7176 pg/mL — ABNORMAL HIGH (ref 0–125)

## 2013-11-12 LAB — PROCALCITONIN: Procalcitonin: 0.69 ng/mL

## 2013-11-12 LAB — LACTIC ACID, PLASMA: LACTIC ACID, VENOUS: 1.3 mmol/L (ref 0.5–2.2)

## 2013-11-12 LAB — PROTEIN, TOTAL: Total Protein: 7.2 g/dL (ref 6.0–8.3)

## 2013-11-12 MED ORDER — SIMVASTATIN 40 MG PO TABS
40.0000 mg | ORAL_TABLET | Freq: Every day | ORAL | Status: DC
  Administered 2013-11-12 – 2013-11-15 (×4): 40 mg via ORAL
  Filled 2013-11-12 (×5): qty 1

## 2013-11-12 MED ORDER — VANCOMYCIN HCL IN DEXTROSE 1-5 GM/200ML-% IV SOLN
1000.0000 mg | Freq: Two times a day (BID) | INTRAVENOUS | Status: DC
  Administered 2013-11-13: 1000 mg via INTRAVENOUS
  Filled 2013-11-12 (×2): qty 200

## 2013-11-12 MED ORDER — PANTOPRAZOLE SODIUM 40 MG IV SOLR
40.0000 mg | Freq: Every day | INTRAVENOUS | Status: DC
Start: 2013-11-12 — End: 2013-11-12

## 2013-11-12 MED ORDER — ASPIRIN 300 MG RE SUPP: 300.0000 mg | RECTAL | Status: AC

## 2013-11-12 MED ORDER — SODIUM CHLORIDE 0.9 % IV SOLN
80.0000 mg | Freq: Once | INTRAVENOUS | Status: DC
  Filled 2013-11-12: qty 80

## 2013-11-12 MED ORDER — IPRATROPIUM-ALBUTEROL 0.5-2.5 (3) MG/3ML IN SOLN
3.0000 mL | Freq: Once | RESPIRATORY_TRACT | Status: AC
  Administered 2013-11-12: 3 mL via RESPIRATORY_TRACT
  Filled 2013-11-12: qty 3

## 2013-11-12 MED ORDER — SODIUM CHLORIDE 0.9 % IV SOLN: Freq: Once | INTRAVENOUS | Status: DC

## 2013-11-12 MED ORDER — HEPARIN SODIUM (PORCINE) 5000 UNIT/ML IJ SOLN
5000.0000 [IU] | Freq: Three times a day (TID) | INTRAMUSCULAR | Status: DC
  Administered 2013-11-12: 5000 [IU] via SUBCUTANEOUS

## 2013-11-12 MED ORDER — SENNOSIDES-DOCUSATE SODIUM 8.6-50 MG PO TABS
2.0000 | ORAL_TABLET | Freq: Two times a day (BID) | ORAL | Status: DC
  Administered 2013-11-12 – 2013-11-17 (×6): 2 via ORAL
  Filled 2013-11-12 (×11): qty 2

## 2013-11-12 MED ORDER — FERROUS SULFATE 220 (44 FE) MG/5ML PO ELIX: 330.0000 mg | ORAL_SOLUTION | Freq: Every day | ORAL | Status: DC

## 2013-11-12 MED ORDER — ALBUTEROL SULFATE (2.5 MG/3ML) 0.083% IN NEBU
2.5000 mg | INHALATION_SOLUTION | Freq: Two times a day (BID) | RESPIRATORY_TRACT | Status: DC
  Administered 2013-11-12 – 2013-11-17 (×10): 2.5 mg via RESPIRATORY_TRACT
  Filled 2013-11-12 (×10): qty 3

## 2013-11-12 MED ORDER — PIPERACILLIN-TAZOBACTAM 3.375 G IVPB
3.3750 g | Freq: Three times a day (TID) | INTRAVENOUS | Status: DC
Start: 2013-11-12 — End: 2013-11-12

## 2013-11-12 MED ORDER — SODIUM CHLORIDE 0.9 % IV SOLN: INTRAVENOUS | Status: DC

## 2013-11-12 MED ORDER — VANCOMYCIN HCL IN DEXTROSE 1-5 GM/200ML-% IV SOLN
1000.0000 mg | Freq: Once | INTRAVENOUS | Status: AC
  Administered 2013-11-12: 1000 mg via INTRAVENOUS
  Filled 2013-11-12: qty 200

## 2013-11-12 MED ORDER — FUROSEMIDE 10 MG/ML IJ SOLN
40.0000 mg | Freq: Two times a day (BID) | INTRAMUSCULAR | Status: AC
  Administered 2013-11-12 – 2013-11-13 (×2): 40 mg via INTRAVENOUS
  Filled 2013-11-12 (×2): qty 4

## 2013-11-12 MED ORDER — SODIUM CHLORIDE 0.9 % IV SOLN: 250.0000 mL | INTRAVENOUS | Status: DC | PRN

## 2013-11-12 MED ORDER — HYDRALAZINE HCL 50 MG PO TABS
50.0000 mg | ORAL_TABLET | Freq: Four times a day (QID) | ORAL | Status: DC
  Administered 2013-11-12 – 2013-11-17 (×16): 50 mg via ORAL
  Filled 2013-11-12 (×23): qty 1

## 2013-11-12 MED ORDER — CETYLPYRIDINIUM CHLORIDE 0.05 % MT LIQD
7.0000 mL | Freq: Two times a day (BID) | OROMUCOSAL | Status: DC
Start: 2013-11-12 — End: 2013-11-17
  Administered 2013-11-12 – 2013-11-17 (×10): 7 mL via OROMUCOSAL

## 2013-11-12 MED ORDER — HEPARIN BOLUS VIA INFUSION
4000.0000 [IU] | Freq: Once | INTRAVENOUS | Status: DC
Start: 2013-11-12 — End: 2013-11-12
  Filled 2013-11-12: qty 4000

## 2013-11-12 MED ORDER — PIPERACILLIN-TAZOBACTAM 3.375 G IVPB
3.3750 g | Freq: Three times a day (TID) | INTRAVENOUS | Status: DC
  Administered 2013-11-12 – 2013-11-16 (×11): 3.375 g via INTRAVENOUS
  Filled 2013-11-12 (×16): qty 50

## 2013-11-12 MED ORDER — PIPERACILLIN-TAZOBACTAM 3.375 G IVPB 30 MIN
3.3750 g | Freq: Once | INTRAVENOUS | Status: AC
Start: 2013-11-12 — End: 2013-11-12
  Administered 2013-11-12: 3.375 g via INTRAVENOUS
  Filled 2013-11-12: qty 50

## 2013-11-12 MED ORDER — VANCOMYCIN HCL IN DEXTROSE 1-5 GM/200ML-% IV SOLN: 1000.0000 mg | Freq: Two times a day (BID) | INTRAVENOUS | Status: DC

## 2013-11-12 MED ORDER — HEPARIN SODIUM (PORCINE) 5000 UNIT/ML IJ SOLN
5000.0000 [IU] | Freq: Three times a day (TID) | INTRAMUSCULAR | Status: DC
  Administered 2013-11-12 – 2013-11-17 (×15): 5000 [IU] via SUBCUTANEOUS
  Filled 2013-11-12 (×19): qty 1

## 2013-11-12 MED ORDER — HEPARIN (PORCINE) IN NACL 100-0.45 UNIT/ML-% IJ SOLN
1300.0000 [IU]/h | INTRAMUSCULAR | Status: DC
  Filled 2013-11-12: qty 250

## 2013-11-12 MED ORDER — LABETALOL HCL 300 MG PO TABS
150.0000 mg | ORAL_TABLET | Freq: Three times a day (TID) | ORAL | Status: DC
  Administered 2013-11-12 (×2): 150 mg via ORAL
  Filled 2013-11-12 (×7): qty 0.5

## 2013-11-12 MED ORDER — PANTOPRAZOLE SODIUM 40 MG IV SOLR: 40.0000 mg | Freq: Two times a day (BID) | INTRAVENOUS | Status: DC

## 2013-11-12 MED ORDER — CHLORHEXIDINE GLUCONATE CLOTH 2 % EX PADS
6.0000 | MEDICATED_PAD | Freq: Every day | CUTANEOUS | Status: DC
  Administered 2013-11-13 – 2013-11-17 (×4): 6 via TOPICAL

## 2013-11-12 MED ORDER — INSULIN ASPART 100 UNIT/ML ~~LOC~~ SOLN
0.0000 [IU] | SUBCUTANEOUS | Status: DC
  Administered 2013-11-12 – 2013-11-15 (×6): 2 [IU] via SUBCUTANEOUS

## 2013-11-12 MED ORDER — FERROUS SULFATE 300 (60 FE) MG/5ML PO SYRP
300.0000 mg | ORAL_SOLUTION | Freq: Every day | ORAL | Status: DC
  Administered 2013-11-12 – 2013-11-17 (×5): 300 mg via ORAL
  Filled 2013-11-12 (×8): qty 5

## 2013-11-12 MED ORDER — ASPIRIN 81 MG PO CHEW
324.0000 mg | CHEWABLE_TABLET | ORAL | Status: AC
  Administered 2013-11-12: 324 mg via ORAL
  Filled 2013-11-12: qty 4

## 2013-11-12 MED ORDER — PREGABALIN 50 MG PO CAPS
50.0000 mg | ORAL_CAPSULE | Freq: Three times a day (TID) | ORAL | Status: DC
  Administered 2013-11-12 – 2013-11-16 (×12): 50 mg via ORAL
  Filled 2013-11-12: qty 1
  Filled 2013-11-12: qty 2
  Filled 2013-11-12: qty 1
  Filled 2013-11-12: qty 2
  Filled 2013-11-12 (×2): qty 1
  Filled 2013-11-12 (×2): qty 2
  Filled 2013-11-12 (×3): qty 1
  Filled 2013-11-12: qty 2

## 2013-11-12 MED ORDER — SODIUM CHLORIDE 0.9 % IV SOLN
8.0000 mg/h | INTRAVENOUS | Status: DC
  Filled 2013-11-12 (×2): qty 80

## 2013-11-12 MED ORDER — POTASSIUM CHLORIDE 20 MEQ/15ML (10%) PO LIQD
20.0000 meq | Freq: Two times a day (BID) | ORAL | Status: AC
  Administered 2013-11-12: 20 meq via ORAL
  Filled 2013-11-12 (×2): qty 15

## 2013-11-12 MED ORDER — METHYLPREDNISOLONE SODIUM SUCC 125 MG IJ SOLR
80.0000 mg | Freq: Once | INTRAMUSCULAR | Status: AC
  Administered 2013-11-12: 80 mg via INTRAVENOUS
  Filled 2013-11-12: qty 2

## 2013-11-12 MED ORDER — MUPIROCIN 2 % EX OINT
1.0000 "application " | TOPICAL_OINTMENT | Freq: Two times a day (BID) | CUTANEOUS | Status: AC
  Administered 2013-11-12 – 2013-11-17 (×10): 1 via NASAL
  Filled 2013-11-12 (×2): qty 22

## 2013-11-12 MED ORDER — VANCOMYCIN HCL 10 G IV SOLR: 1.0000 g | Freq: Once | INTRAVENOUS | Status: DC

## 2013-11-12 NOTE — ED Notes (Signed)
Antibiotics given prior to blood cultures being administered.  Admitting MD made aware.

## 2013-11-12 NOTE — H&P (Signed)
PULMONARY / CRITICAL CARE MEDICINE   Name: [name] MRN: [id number] DOB: August 08, 1945    ADMISSION DATE:  11/12/2013 CONSULTATION DATE:  7/30  REFERRING MD :  Horton   CHIEF COMPLAINT:  Acute respiratory failure   INITIAL PRESENTATION:   This is a 68 year old female who resides at a SNF. Has multiple co-morbids: baseline dementia, Diastolic dysfxn, PVD, DM and right AKA. Admitted on 7/30 after awakening SOB. O2 sats in 60s on 2 liters. On arrival she was found to have a BNP of 7176, large left effusion and pulmonary edema. PCCM was asked to see for acute respiratory failure.   STUDIES:  ECHO 7/30>>> Site: left Date: 7/30  Pleural fluid Serum   LDH: LDH:  Protein: Protein:  Cholesterol:  Cholesterol:  Afb:   Gm stain:    Culture:   Fungal:    Hct:    Color:    Wbc:   Neutrophils:    Lymph:    Ph:    Rheumatoid factor:   Adenosine Deaminase:   Glucose:    Cytology:       Special notes:      SIGNIFICANT EVENTS: 7/30 admitted w/ sudden onset of SOB   HISTORY OF PRESENT ILLNESS:    This is a 68 year old female who resides at a SNF. Has multiple co-morbids: baseline dementia, Diastolic dysfxn, PVD, DM and right AKA. Awoke suddenly the am of 7/30 with resting dyspnea. O2 sats noted in the 60s on 2 liters. EMS was called, she was placed on 100% NRB and transferred to ER. On arrival she was found to have a BNP of 7176, large left effusion and pulmonary edema. PCCM was asked to see for acute respiratory failure.   PAST MEDICAL HISTORY :  Past Medical History  Diagnosis Date  . Diabetes mellitus   . Hypertension   . Stroke   . Hyperlipidemia   . Anemia   . CAD (coronary artery disease)   . Arthritis   . Ulcer   . Hypernatremia   . Hypokalemia   . Carotid artery occlusion    Past Surgical History  Procedure Laterality Date  . Above knee leg amputation      RIGHT AKA  . Carotid endarterectomy    . Peripheral arterial stent graft  03/16/2002    PTA & stenting  of the left iliac artery  . Cardiac catheterization  04/10/2002    Normal  . Carotid artery angioplasty  02/22/2009    PTA & stenting Choice Protocol  . Permanent pacemaker insertion  08/08/2010    [facility]  . Nm myocar perf wall motion  04/07/2009    normal   Prior to Admission medications   Medication Sig Start Date End Date Taking? Authorizing Provider  acetaminophen (TYLENOL) 325 MG tablet Take 650 mg by mouth every 4 (four) hours as needed (for pain).    Yes Historical Provider, MD  albuterol (PROVENTIL) (2.5 MG/3ML) 0.083% nebulizer solution Take 2.5 mg by nebulization 2 (two) times daily.     Yes Historical Provider, MD  chlorhexidine (PERIDEX) 0.12 % solution Use as directed 10 mLs in the mouth or throat 4 (four) times daily.    Yes Historical Provider, MD  Eyelid Cleansers (OCUSOFT LID SCRUB) PADS Place 1 each into both eyes every morning.   Yes Historical Provider, MD  ferrous sulfate 220 (44 FE) MG/5ML solution Take 330 mg by mouth daily. Mix with juice   Yes Historical Provider, MD  hydrALAZINE (APRESOLINE)  50 MG tablet Take 50 mg by mouth 4 (four) times daily.     Yes Historical Provider, MD  insulin lispro (HUMALOG) 100 UNIT/ML injection Inject 0-9 Units into the skin 3 (three) times daily with meals. Per SS: 60-10= 0 units, 101-151=1 unit, 151-200=2 units, 202-250=3 units 251-300=5 units, 301-350=7 units and >350=7 units   Yes Historical Provider, MD  labetalol (NORMODYNE) 300 MG tablet Take 150 mg by mouth every 8 (eight) hours.    Yes Historical Provider, MD  lidocaine (LIDODERM) 5 % Place 1 patch onto the skin daily. Remove & Discard patch within 12 hours or as directed by MD    Yes Historical Provider, MD  Multiple Vitamin (MULTIVITAMIN) tablet Take 1 tablet by mouth daily.     Yes Historical Provider, MD  omeprazole (PRILOSEC) 20 MG capsule Take 20 mg by mouth daily.   Yes Historical Provider, MD  oxyCODONE-acetaminophen (PERCOCET) 5-325 MG per tablet Take 1 tablet by mouth  every 4 (four) hours as needed (for pain).    Yes Historical Provider, MD  polyethylene glycol powder (MIRALAX) powder Take 17 g by mouth daily as needed.     Yes Historical Provider, MD  pravastatin (PRAVACHOL) 80 MG tablet Take 80 mg by mouth at bedtime.    Yes Historical Provider, MD  pregabalin (LYRICA) 50 MG capsule Take 50 mg by mouth 3 (three) times daily.    Yes Historical Provider, MD  sennosides-docusate sodium (SENOKOT-S) 8.6-50 MG tablet Take 2 tablets by mouth 2 (two) times daily.   Yes Historical Provider, MD  vitamin C (ASCORBIC ACID) 500 MG tablet Take 500 mg by mouth 3 (three) times daily. Give with ferrous sulfate   Yes Historical Provider, MD   Allergies  Allergen Reactions  . Iohexol      Desc: pt allergic to ivp dye/iodine, unsure of reaction   . Latex Other (See Comments)    Unknown     FAMILY HISTORY:  Family History  Problem Relation Age of Onset  . Diabetes Mother   . Stroke Mother   . Cancer Father     colon  . Diabetes Sister   . Coronary artery disease Sister   . Diabetes Brother   . Coronary artery disease Brother    SOCIAL HISTORY:  reports that she quit smoking about 3 years ago. Her smoking use included Cigarettes. She has a 41 pack-year smoking history. She does not have any smokeless tobacco history on file. She reports that she does not drink alcohol or use illicit drugs.  REVIEW OF SYSTEMS:   Not able to reliably provide   SUBJECTIVE:  + accessory muscle use  VITAL SIGNS: Temp:  [97.7 F (36.5 C)] 97.7 F (36.5 C) (07/30 1037) Pulse Rate:  [59-60] 59 (07/30 1300) Resp:  [18-24] 22 (07/30 1300) BP: (141-170)/(30-95) 143/53 mmHg (07/30 1300) SpO2:  [91 %-100 %] 91 % (07/30 1300) 100% NRB HEMODYNAMICS:   VENTILATOR SETTINGS:   INTAKE / OUTPUT: No intake or output data in the 24 hours ending 11/12/13 1348  PHYSICAL EXAMINATION: General:  Acute on chronically ill appearing AAF  Neuro:  Awake, alert, follows commands.  HEENT:  Very  poor dentition MMM  Cardiovascular:  rrr Lungs:  Decreased on left + accessory muscle use  Abdomen:  Soft, non-tender  Musculoskeletal:  Right AKA left foot w/ pressure reducing boot  Skin:  Intact   LABS:  CBC  Recent Labs Lab 11/12/13 1114  WBC 11.8*  HGB 9.4*  HCT 30.2*  PLT 303   Coag's  Recent Labs Lab 11/12/13 1114  INR 1.07   BMET  Recent Labs Lab 11/12/13 1114  NA 147  K 4.3  CL 113*  CO2 20  BUN 26*  CREATININE 0.68  GLUCOSE 95   Electrolytes  Recent Labs Lab 11/12/13 1114  CALCIUM 8.7   Sepsis Markers No results found for this basename: LATICACIDVEN, PROCALCITON, O2SATVEN,  in the last 168 hours ABG  Recent Labs Lab 11/12/13 1142  PHART 7.369  PCO2ART 35.5  PO2ART 45.0*   Liver Enzymes No results found for this basename: AST, ALT, ALKPHOS, BILITOT, ALBUMIN,  in the last 168 hours Cardiac Enzymes  Recent Labs Lab 11/12/13 1114  TROPONINI <0.30  PROBNP 7176.0*   Glucose No results found for this basename: GLUCAP,  in the last 168 hours  Imaging No results found.   ASSESSMENT / PLAN:  PULMONARY  A:  Acute hypoxic respiratory failure > definite pulmonary edema from acute decompensated CHF and probable HCAP   Pleural effusion> looks exudative, HCAP? P:   Admit to ICU Supplemental oxygen  Therapeutic and diagnostic left thoracentesis   CARDIOVASCULAR CVL A:  H/o HTN and gd I diastolic dysfxn. EF 55% 2012 Pulmonary edema  P:  Admit to ICU Repeat ECHO Consider diuresis  Cont home antihypertensives: hydralazine, labetalol, cont statin   RENAL A:  Mild hypernatremia  P:   F/u chem   GASTROINTESTINAL A:   No acute   P:   NPO for now   HEMATOLOGIC A:   Anemia of chronic disease: no evidence of acute bleeding  P:  [place] heparin    INFECTIOUS A:  Rule out HCAP  P:   BCx2 7/30>>> UC 7/30>>> Ck Procalcitonin  Pleural fluid cx 7/30 >> vanc 7/30 >> Zosyn 7/30 >>  ENDOCRINE A:  DM w/ neuropathy  H/o  multinodular goiter   P:   ssi  Ck Tsh   NEUROLOGIC A:  Acute encephalopathy vs dementia Not clear how close she is to baseline.  P:   RASS goal: 0 Supportive care   TODAY'S SUMMARY:  This is a 68 year old female who presents in acute hypoxic respiratory failure in setting of pulmonary edema and large left effusion. We will admit her w/ working dx of decompensated heart failure. Once in the ICU will perform therapeutic and diagnostic thoracentesis, ck echo, cycle CEs and diurese. Suspect her work of breathing should improve w/ these measures. Have discussed briefly w/ family who say she is full code status.   I have personally obtained a history, examined the patient, evaluated laboratory and imaging results, formulated the assessment and plan and placed orders. CRITICAL CARE: The patient is critically ill with multiple organ systems failure and requires high complexity decision making for assessment and support, frequent evaluation and titration of therapies, application of advanced monitoring technologies and extensive interpretation of multiple databases. Critical Care Time devoted to patient care services described in this note is 45 minutes.    Heber [place], MD [place] [facility] Pager: [phone] Cell: [phone] If no response, call [phone]   11/12/2013, 1:48 PM

## 2013-11-12 NOTE — Procedures (Signed)
Thoracentesis Procedure Note  Pre-operative Diagnosis: L pleural effusion  Post-operative Diagnosis: same  Indications: Dyspnea, hypoxia  Procedure Details  Consent: Informed consent was obtained. Risks of the procedure were discussed including: infection, bleeding, pain, pneumothorax.  Under sterile conditions the patient was positioned. Betadine solution and sterile drapes were utilized.  2% buffered lidocaine was used to anesthetize. Pleural fluid identified via ultrasound. Fluid was obtained without any difficulties and minimal blood loss.  A dressing was applied to the wound and wound care instructions were provided.   Findings 700 mL of cloudy, bloody pleural fluid was obtained. A sample was sent to Pathology for cytogenetics, flow, and cell counts, as well as for infection analysis.  Complications:  None; patient tolerated the procedure well.          Condition: stable  Plan A follow up chest x-ray was ordered. Bed Rest for 1 hours.  Joneen RoachPaul Hoffman, ACNP [place] [facility] Pager [phone] or [phone]  Attending: I was present for and supervised the procedure  Heber CarolinaBrent Siyah Mault, MD [place] [facility] Pager: [phone] Cell: [phone] If no response, call [phone]

## 2013-11-12 NOTE — ED Notes (Signed)
EMS - Pt coming from Nursing Home with shortness of breath upon awakening.  Pt had initial saturation with nursing home in the 60%.  Pt given 2.5mg  Albuterol, no relief and a Duoneb, increased to 94%.  CBG 111, BP 160/74, 60 pulse.    ED - Pt at 74% on Room air, placed on a non re-breather at 99%.

## 2013-11-12 NOTE — ED Provider Notes (Signed)
CSN: [id number]     Arrival date & time 11/12/13  1019 History   First MD Initiated Contact with Patient 11/12/13 1021     Chief Complaint  Patient presents with  . Shortness of Breath     (Consider location/radiation/quality/duration/timing/severity/associated sxs/prior Treatment) HPI  This is a 68 year old female with a history of dementia, diabetes, hypertension, CVA, hyperlipidemia, coronary artery disease who presents with shortness of breath. Per EMS report, patient woke up this morning complaining of shortness of breath. She was noted to have oxygen saturations of 60% on her 2 L of nasal cannula. She was given albuterol on a DuoNeb in route. Patient noted to have O2 sats 74% on room air in the ER. She is placed on a nonrebreather. Patient is largely noncontributory to history taking. She has a history of dementia. She denies any chest pain at this time.  No reported recent fevers or illnesses.  On review of chart from facility, patient is FULL CODE.  Past Medical History  Diagnosis Date  . Diabetes mellitus   . Hypertension   . Stroke   . Hyperlipidemia   . Anemia   . CAD (coronary artery disease)   . Arthritis   . Ulcer   . Hypernatremia   . Hypokalemia   . Carotid artery occlusion    Past Surgical History  Procedure Laterality Date  . Above knee leg amputation      RIGHT AKA  . Carotid endarterectomy    . Peripheral arterial stent graft  03/16/2002    PTA & stenting of the left iliac artery  . Cardiac catheterization  04/10/2002    Normal  . Carotid artery angioplasty  02/22/2009    PTA & stenting Choice Protocol  . Permanent pacemaker insertion  08/08/2010    [facility]  . Nm myocar perf wall motion  04/07/2009    normal   Family History  Problem Relation Age of Onset  . Diabetes Mother   . Stroke Mother   . Cancer Father     colon  . Diabetes Sister   . Coronary artery disease Sister   . Diabetes Brother   . Coronary artery disease Brother    History   Substance Use Topics  . Smoking status: Former Smoker -- 1.00 packs/day for 41 years    Types: Cigarettes    Quit date: 04/16/2010  . Smokeless tobacco: Not on file  . Alcohol Use: No   OB History   Grav Para Term Preterm Abortions TAB SAB Ect Mult Living                 Review of Systems  Unable to perform ROS: Dementia      Allergies  Iohexol and Latex  Home Medications   Prior to Admission medications   Medication Sig Start Date End Date Taking? Authorizing Provider  acetaminophen (TYLENOL) 325 MG tablet Take 650 mg by mouth every 4 (four) hours as needed (for pain).    Yes Historical Provider, MD  albuterol (PROVENTIL) (2.5 MG/3ML) 0.083% nebulizer solution Take 2.5 mg by nebulization 2 (two) times daily.     Yes Historical Provider, MD  chlorhexidine (PERIDEX) 0.12 % solution Use as directed 10 mLs in the mouth or throat 4 (four) times daily.    Yes Historical Provider, MD  Eyelid Cleansers (OCUSOFT LID SCRUB) PADS Place 1 each into both eyes every morning.   Yes Historical Provider, MD  ferrous sulfate 220 (44 FE) MG/5ML solution Take 330 mg by mouth  daily. Mix with juice   Yes Historical Provider, MD  hydrALAZINE (APRESOLINE) 50 MG tablet Take 50 mg by mouth 4 (four) times daily.     Yes Historical Provider, MD  insulin lispro (HUMALOG) 100 UNIT/ML injection Inject 0-9 Units into the skin 3 (three) times daily with meals. Per SS: 60-10= 0 units, 101-151=1 unit, 151-200=2 units, 202-250=3 units 251-300=5 units, 301-350=7 units and >350=7 units   Yes Historical Provider, MD  labetalol (NORMODYNE) 300 MG tablet Take 150 mg by mouth every 8 (eight) hours.    Yes Historical Provider, MD  lidocaine (LIDODERM) 5 % Place 1 patch onto the skin daily. Remove & Discard patch within 12 hours or as directed by MD    Yes Historical Provider, MD  Multiple Vitamin (MULTIVITAMIN) tablet Take 1 tablet by mouth daily.     Yes Historical Provider, MD  omeprazole (PRILOSEC) 20 MG capsule Take  20 mg by mouth daily.   Yes Historical Provider, MD  oxyCODONE-acetaminophen (PERCOCET) 5-325 MG per tablet Take 1 tablet by mouth every 4 (four) hours as needed (for pain).    Yes Historical Provider, MD  polyethylene glycol powder (MIRALAX) powder Take 17 g by mouth daily as needed.     Yes Historical Provider, MD  pravastatin (PRAVACHOL) 80 MG tablet Take 80 mg by mouth at bedtime.    Yes Historical Provider, MD  pregabalin (LYRICA) 50 MG capsule Take 50 mg by mouth 3 (three) times daily.    Yes Historical Provider, MD  sennosides-docusate sodium (SENOKOT-S) 8.6-50 MG tablet Take 2 tablets by mouth 2 (two) times daily.   Yes Historical Provider, MD  vitamin C (ASCORBIC ACID) 500 MG tablet Take 500 mg by mouth 3 (three) times daily. Give with ferrous sulfate   Yes Historical Provider, MD   BP 167/51  Pulse 59  Temp(Src) 97.7 F (36.5 C) (Axillary)  Resp 24  SpO2 98% Physical Exam  Nursing note and vitals reviewed. Constitutional:  Elderly, chronically ill-appearing  HENT:  Head: Normocephalic and atraumatic.  Mucous membranes dry  Eyes: Pupils are equal, round, and reactive to light.  Cardiovascular: Normal rate, regular rhythm and normal heart sounds.   No murmur heard. Pulmonary/Chest: No respiratory distress. She has no wheezes.  Mild tachypnea, mouth breathing, decreased breath sounds on the left with crackles  Abdominal: Soft. Bowel sounds are normal. There is no tenderness. There is no rebound.  Musculoskeletal: She exhibits no edema.  AKA on the right  Neurological: She is alert.  Follow simple commands, moves all 4 extremities  Skin: Skin is warm and dry.    ED Course  Procedures (including critical care time)  CRITICAL CARE Performed by: Ross MarcusHORTON, COURTNEY, F   Total critical care time: 40 min  Critical care time was exclusive of separately billable procedures and treating other patients.  Critical care was necessary to treat or prevent imminent or  life-threatening deterioration.  Critical care was time spent personally by me on the following activities: development of treatment plan with patient and/or surrogate as well as nursing, discussions with consultants, evaluation of patient's response to treatment, examination of patient, obtaining history from patient or surrogate, ordering and performing treatments and interventions, ordering and review of laboratory studies, ordering and review of radiographic studies, pulse oximetry and re-evaluation of patient's condition.  Labs Review Labs Reviewed  CBC WITH DIFFERENTIAL - Abnormal; Notable for the following:    WBC 11.8 (*)    RBC 3.34 (*)    Hemoglobin 9.4 (*)  HCT 30.2 (*)    Neutrophils Relative % 90 (*)    Neutro Abs 10.5 (*)    Lymphocytes Relative 7 (*)    All other components within normal limits  BASIC METABOLIC PANEL - Abnormal; Notable for the following:    Chloride 113 (*)    BUN 26 (*)    GFR calc non Af Amer 87 (*)    All other components within normal limits  PRO B NATRIURETIC PEPTIDE - Abnormal; Notable for the following:    Pro B Natriuretic peptide (BNP) 7176.0 (*)    All other components within normal limits  D-DIMER, QUANTITATIVE - Abnormal; Notable for the following:    D-Dimer, Quant 5.24 (*)    All other components within normal limits  I-STAT ARTERIAL BLOOD GAS, ED - Abnormal; Notable for the following:    pO2, Arterial 45.0 (*)    Acid-base deficit 4.0 (*)    All other components within normal limits  TROPONIN I    Imaging Review Dg Chest Portable 1 View  11/12/2013   CLINICAL DATA:  Shortness of breath and decreased oxygen saturation; history of CVA and coronary artery disease and long-term smoking history  EXAM: PORTABLE CHEST - 1 VIEW  COMPARISON:  PA and lateral chest of November 03, 2010  FINDINGS: The cardiopericardial silhouette is enlarged. The pulmonary vascularity is engorged and indistinct there is right infrahilar atelectasis or pneumonia.  There is obscuration of much of the left hemi- thorax secondary to the enlarged cardiac silhouette and likely pleural fluid. A trace of pleural fluid on the right may be present. The permanent pacemaker is in appropriate position. The bony thorax is unremarkable.  IMPRESSION: Congestive heart failure with pulmonary interstitial edema. A moderate to large left pleural effusion is suspected. Right infrahilar atelectasis or pneumonia is suspected.   Electronically Signed   By: David  [place]   On: 11/12/2013 11:16     EKG Interpretation   Date/Time:  Thursday November 12 2013 10:29:32 EDT Ventricular Rate:  60 PR Interval:  170 QRS Duration: 82 QT Interval:  459 QTC Calculation: 459 R Axis:   27 Text Interpretation:  Sinus rhythm Nonspecific T abnormalities, diffuse  leads Flattening of T waves when compared to prior Confirmed by HORTON   MD, COURTNEY ([id number]) on 11/12/2013 10:39:07 AM      MDM   Final diagnoses:  Hypoxia  Pleural effusion    Patient presents with reported shortness of breath. She is noncontributory to history taking. Noted to be hypoxic upon arrival. Requiring nasal cannula. Otherwise she is mouth breathing and mildly tachypnic.  No obvious edema of the lower extremity. No known history of CHF. Patient does have an echo in our system that showed EF of 50-55% with grade 1 diastolic dysfunction. Will interrogate pacemaker. EKG reassuring.  W/U notable for large pleural effusion on xray with possible pneumonia and elevated dimer.  Patient cannot get contrasted scan for PE 2/2 allergy.  Patient covered with Vanc and Zosyn and blood cultures started.  Patient continues to require high flow [place] or NRB for oxygenation and ABG with p02 of 45 concerning for considerable shunt.  Will also start empiric heparin drip until patient stabilized for formal PE r/o.  Discussed admission with hospitalist.  GIven that patient is full code and has a large pleural effusion with hypoxia, he will have  critical care evaluate.    Shon Baton, MD 11/12/13 [phone]

## 2013-11-12 NOTE — ED Notes (Addendum)
Respiratory phoned for Duoneb and Arterial Blood Gas draw.    This RN attempted two IV's with no success, a second nurse is attempting IV.

## 2013-11-12 NOTE — Progress Notes (Signed)
CRITICAL VALUE ALERT  Critical value received:  + MRSA nasal swab  Date of notification:  11/12/2013  Time of notification:  8:58 PM  Critical value read back:Yes.    Nurse who received alert:  Terrilyn SaverHopper, Alyssa Rotondo Anderson  MD notified (1st page):  Dr. Sung AmabileSimonds  Time of first page:  8:58 PM  MD notified (2nd page):  Time of second page:  Responding MD:  Dr. Sung AmabileSimonds  Time MD responded:  8:59 PM

## 2013-11-12 NOTE — Progress Notes (Addendum)
ANTICOAGULATION /ANTIBIOTIC CONSULT NOTE - Initial Consult  Pharmacy Consult for heparin, vancomycin, zosyn Indication: pulmonary embolus and HCAP  Allergies  Allergen Reactions  . Iohexol      Desc: pt allergic to ivp dye/iodine, unsure of reaction   . Latex Other (See Comments)    Unknown     Patient Measurements:   Heparin Dosing Weight: 72.6 kg  Vital Signs: Temp: 97.7 F (36.5 C) (07/30 1037) Temp src: Axillary (07/30 1037) BP: 145/95 mmHg (07/30 1230) Pulse Rate: 59 (07/30 1230)  Labs:  Recent Labs  11/12/13 1114  HGB 9.4*  HCT 30.2*  PLT 303  CREATININE 0.68  TROPONINI <0.30    The CrCl is unknown because both a height and weight (above a minimum accepted value) are required for this calculation.   Medical History: Past Medical History  Diagnosis Date  . Diabetes mellitus   . Hypertension   . Stroke   . Hyperlipidemia   . Anemia   . CAD (coronary artery disease)   . Arthritis   . Ulcer   . Hypernatremia   . Hypokalemia   . Carotid artery occlusion     Medications:  See med history  Assessment: 68 yo lady to start heparin r/o PE.  She was hypoxic on admit and d-dimer 5.24.  Hg 9.4, PTLC 303  She will also start broad spectrum antibiotics for HCAP.  She received one gram vanc and 3.375 gm zosyn in the ED. Goal of Therapy:  Vancomycin trough 15-20 mg/L Heparin level 0.3-0.7 units/ml Monitor platelets by anticoagulation protocol: Yes   Plan:  Heparin bolus 4000 units and drip at 1300 units/hr Check heparin level in 6 hours Daily HL and CBC while on heparin Monitor for bleeding Cont zosyn 3.375 gm IV q8 hours Cont vancomycin 1000 mg IV q12 hours F/u clinical course, cultures and renal function  Thanks for allowing pharmacy to be a part of this patient's care.  Talbert CageLora Seay, PharmD Clinical Pharmacist, [phone] 11/12/2013,1:16 PM   Addendum: Pt found to have L left pleural effusion and pulmonary edema. IV heparin d/c upon admit. To  continue IV Zosyn and Vancomycin for HCAP. See above plans for antibiotics.  Christoper Fabianaron Unknown Flannigan, PharmD, BCPS Clinical pharmacist, pager [phone] 11/12/2013  3:56 PM

## 2013-11-13 ENCOUNTER — Inpatient Hospital Stay (HOSPITAL_COMMUNITY): Payer: PRIVATE HEALTH INSURANCE

## 2013-11-13 DIAGNOSIS — I319 Disease of pericardium, unspecified: Secondary | ICD-10-CM

## 2013-11-13 DIAGNOSIS — R609 Edema, unspecified: Secondary | ICD-10-CM

## 2013-11-13 LAB — GLUCOSE, CAPILLARY
GLUCOSE-CAPILLARY: 79 mg/dL (ref 70–99)
Glucose-Capillary: 123 mg/dL — ABNORMAL HIGH (ref 70–99)
Glucose-Capillary: 125 mg/dL — ABNORMAL HIGH (ref 70–99)
Glucose-Capillary: 81 mg/dL (ref 70–99)
Glucose-Capillary: 89 mg/dL (ref 70–99)

## 2013-11-13 LAB — TROPONIN I

## 2013-11-13 LAB — CBC
HEMATOCRIT: 28.6 % — AB (ref 36.0–46.0)
HEMOGLOBIN: 9.1 g/dL — AB (ref 12.0–15.0)
MCH: 27.9 pg (ref 26.0–34.0)
MCHC: 31.8 g/dL (ref 30.0–36.0)
MCV: 87.7 fL (ref 78.0–100.0)
Platelets: 316 10*3/uL (ref 150–400)
RBC: 3.26 MIL/uL — AB (ref 3.87–5.11)
RDW: 14.9 % (ref 11.5–15.5)
WBC: 5.2 10*3/uL (ref 4.0–10.5)

## 2013-11-13 LAB — BASIC METABOLIC PANEL
ANION GAP: 17 — AB (ref 5–15)
BUN: 26 mg/dL — AB (ref 6–23)
CHLORIDE: 110 meq/L (ref 96–112)
CO2: 21 meq/L (ref 19–32)
CREATININE: 0.69 mg/dL (ref 0.50–1.10)
Calcium: 9.2 mg/dL (ref 8.4–10.5)
GFR calc Af Amer: >90 mL/min (ref >90)
GFR calc non Af Amer: 86 mL/min — ABNORMAL LOW (ref >90)
GLUCOSE: 127 mg/dL — AB (ref 70–99)
Potassium: 4.8 mEq/L (ref 3.7–5.3)
Sodium: 148 mEq/L — ABNORMAL HIGH (ref 137–147)

## 2013-11-13 LAB — COMPREHENSIVE METABOLIC PANEL
ALBUMIN: 3 g/dL — AB (ref 3.5–5.2)
ALT: 15 U/L (ref 0–35)
AST: 17 U/L (ref 0–37)
Alkaline Phosphatase: 74 U/L (ref 39–117)
Anion gap: 13 (ref 5–15)
BUN: 26 mg/dL — ABNORMAL HIGH (ref 6–23)
CALCIUM: 9 mg/dL (ref 8.4–10.5)
CO2: 23 meq/L (ref 19–32)
Chloride: 112 mEq/L (ref 96–112)
Creatinine, Ser: 0.71 mg/dL (ref 0.50–1.10)
GFR calc Af Amer: >90 mL/min (ref >90)
GFR calc non Af Amer: 85 mL/min — ABNORMAL LOW (ref >90)
Glucose, Bld: 79 mg/dL (ref 70–99)
Potassium: 4.2 mEq/L (ref 3.7–5.3)
Sodium: 148 mEq/L — ABNORMAL HIGH (ref 137–147)
TOTAL PROTEIN: 6.7 g/dL (ref 6.0–8.3)
Total Bilirubin: 0.2 mg/dL — ABNORMAL LOW (ref 0.3–1.2)

## 2013-11-13 LAB — PROCALCITONIN: PROCALCITONIN: 0.5 ng/mL

## 2013-11-13 LAB — PRO B NATRIURETIC PEPTIDE: Pro B Natriuretic peptide (BNP): 7219 pg/mL — ABNORMAL HIGH (ref 0–125)

## 2013-11-13 LAB — LACTIC ACID, PLASMA: Lactic Acid, Venous: 1.1 mmol/L (ref 0.5–2.2)

## 2013-11-13 MED ORDER — VANCOMYCIN HCL 500 MG IV SOLR
500.0000 mg | Freq: Two times a day (BID) | INTRAVENOUS | Status: DC
  Administered 2013-11-13 – 2013-11-15 (×4): 500 mg via INTRAVENOUS
  Filled 2013-11-13 (×6): qty 500

## 2013-11-13 MED ORDER — LABETALOL HCL 200 MG PO TABS
200.0000 mg | ORAL_TABLET | Freq: Three times a day (TID) | ORAL | Status: DC
  Administered 2013-11-13 – 2013-11-15 (×5): 200 mg via ORAL
  Filled 2013-11-13 (×9): qty 1

## 2013-11-13 MED ORDER — ENSURE COMPLETE PO LIQD
237.0000 mL | Freq: Two times a day (BID) | ORAL | Status: DC
  Administered 2013-11-13 – 2013-11-17 (×8): 237 mL via ORAL

## 2013-11-13 NOTE — Plan of Care (Signed)
Problem: ICU Phase Progression Outcomes Goal: Initial discharge plan identified Outcome: Completed/Met Date Met:  11/13/13 Plan to discharge back to [facility].

## 2013-11-13 NOTE — Progress Notes (Signed)
INITIAL NUTRITION ASSESSMENT  DOCUMENTATION CODES Per approved criteria  -Not Applicable   INTERVENTION: - Ensure Complete po TID, each supplement provides 350 kcal and 13 grams of protein - Diet downgraded to Dysphagia 1 Heart Healthy per family's request.  NUTRITION DIAGNOSIS: Inadequate oral intake related to ARF as evidenced by suspected wt loss.   Goal: Pt to meet >/= 90% of their estimated nutrition needs   Monitor:  Weight trends, po intake, acceptance of supplements  Reason for Assessment: Low Braden  68 y.o. female  Admitting Dx: <principal problem not specified>  ASSESSMENT: 68 year old female who resides at a SNF. Has multiple co-morbids: baseline dementia, Diastolic dysfxn, PVD, DM and right AKA. Admitted on 7/30 after awakening SOB. O2 sats in 60s on 2 liters. On arrival she was found to have a BNP of 7176, large left effusion and pulmonary edema. PCCM was asked to see for acute respiratory failure.  - Spoke with pt's son who lives out of town. He said that pt has lost weight, but he is unsure of amount. She was not eating well at SNF where she resides, but she will drink strawberry ensure. - Per son, pt is on a pureed diet at SNF and [facility]  Height: Ht Readings from Last 1 Encounters:  11/12/13 4\' 11"  (1.499 m)    Weight: Wt Readings from Last 1 Encounters:  11/13/13 127 lb 13.9 oz (58 kg)    Ideal Body Weight: 43.2 kg  % Ideal Body Weight: 134%  Wt Readings from Last 10 Encounters:  11/13/13 127 lb 13.9 oz (58 kg)  03/19/11 160 lb (72.576 kg)  02/23/10 183 lb 8 oz (83.235 kg)  05/24/09 186 lb (84.369 kg)  01/10/09 192 lb (87.091 kg)  12/14/08 185 lb (83.915 kg)  06/09/08 197 lb (89.359 kg)  12/29/07 195 lb (88.451 kg)  07/01/07 194 lb 4 oz (88.111 kg)  02/11/07 198 lb (89.812 kg)    Usual Body Weight: unknown  % Usual Body Weight: unknown  BMI:  Body mass index is 25.81 kg/(m^2).  Estimated Nutritional  Needs: Kcal: 1500-1700 Protein: 80-90 g Fluid: 1.7 L/day  Skin: Intact  Diet Order: Dysphagia  EDUCATION NEEDS: -Education not appropriate at this time   Intake/Output Summary (Last 24 hours) at 11/13/13 1338 Last data filed at 11/13/13 1300  Gross per 24 hour  Intake    370 ml  Output   1326 ml  Net   -956 ml    Last BM: none recorded   Labs:   Recent Labs Lab 11/12/13 1114 11/13/13 0220  NA 147 148*  K 4.3 4.8  CL 113* 110  CO2 20 21  BUN 26* 26*  CREATININE 0.68 0.69  CALCIUM 8.7 9.2  GLUCOSE 95 127*    CBG (last 3)   Recent Labs  11/13/13 0408 11/13/13 0804 11/13/13 1211  GLUCAP 125* 81 79    Scheduled Meds: . albuterol  2.5 mg Nebulization BID  . antiseptic oral rinse  7 mL Mouth Rinse BID  . Chlorhexidine Gluconate Cloth  6 each Topical Q0600  . ferrous sulfate  300 mg Oral Q breakfast  . heparin  5,000 Units Subcutaneous 3 times per day  . hydrALAZINE  50 mg Oral QID  . insulin aspart  0-15 Units Subcutaneous 6 times per day  . labetalol  200 mg Oral 3 times per day  . mupirocin ointment  1 application Nasal BID  . piperacillin-tazobactam (ZOSYN)  IV  3.375 g Intravenous Q8H  . potassium chloride  20 mEq Oral Q12H  . pregabalin  50 mg Oral TID  . senna-docusate  2 tablet Oral BID  . simvastatin  40 mg Oral q1800  . vancomycin  500 mg Intravenous Q12H    Continuous Infusions:   Past Medical History  Diagnosis Date  . Diabetes mellitus   . Hypertension   . Stroke   . Hyperlipidemia   . Anemia   . CAD (coronary artery disease)   . Arthritis   . Ulcer   . Hypernatremia   . Hypokalemia   . Carotid artery occlusion     Past Surgical History  Procedure Laterality Date  . Above knee leg amputation      RIGHT AKA  . Carotid endarterectomy    . Peripheral arterial stent graft  03/16/2002    PTA & stenting of the left iliac artery  . Cardiac catheterization  04/10/2002    Normal  . Carotid artery angioplasty  02/22/2009    PTA &  stenting Choice Protocol  . Permanent pacemaker insertion  08/08/2010    [facility]  . Nm myocar perf wall motion  04/07/2009    normal    Ebbie Latus RD, LDN

## 2013-11-13 NOTE — Progress Notes (Signed)
  Echocardiogram 2D Echocardiogram has been performed.  Georgian CoWILLIAMS, Oluwatosin Higginson 11/13/2013, 9:26 AM

## 2013-11-13 NOTE — Progress Notes (Signed)
ANTIBIOTIC CONSULT NOTE - FOLLOW UP  Pharmacy Consult for Vancomycin Indication: pneumonia  Allergies  Allergen Reactions  . Iohexol      Desc: pt allergic to ivp dye/iodine, unsure of reaction   . Latex Other (See Comments)    Unknown     Patient Measurements: Height: 4\' 11"  (149.9 cm) Weight: 127 lb 13.9 oz (58 kg) IBW/kg (Calculated) : 43.2  Vital Signs: Temp: 97.5 F (36.4 C) (07/31 0805) Temp src: Oral (07/31 0805) BP: 147/40 mmHg (07/31 0900) Pulse Rate: 60 (07/31 0900) Intake/Output from previous day: 07/30 0701 - 07/31 0700 In: 312.5 [I.V.:100; IV Piggyback:212.5] Out: 601 [Urine:601] Intake/Output from this shift: Total I/O In: 25 [IV Piggyback:25] Out: 145 [Urine:145]  Labs:  Recent Labs  11/12/13 1114 11/13/13 0220  WBC 11.8* 5.2  HGB 9.4* 9.1*  PLT 303 316  CREATININE 0.68 0.69   Estimated Creatinine Clearance: 50.7 ml/min (by C-G formula based on Cr of 0.69). No results found for this basename: VANCOTROUGH, Leodis BinetVANCOPEAK, VANCORANDOM, GENTTROUGH, GENTPEAK, GENTRANDOM, TOBRATROUGH, TOBRAPEAK, TOBRARND, AMIKACINPEAK, AMIKACINTROU, AMIKACIN,  in the last 72 hours   Microbiology: Recent Results (from the past 720 hour(s))  MRSA PCR SCREENING     Status: Abnormal   Collection Time    11/12/13  3:45 PM      Result Value Ref Range Status   MRSA by PCR POSITIVE (*) NEGATIVE Final   Comment:            The GeneXpert MRSA Assay (FDA     approved for NASAL specimens     only), is one component of a     comprehensive MRSA colonization     surveillance program. It is not     intended to diagnose MRSA     infection nor to guide or     monitor treatment for     MRSA infections.     RESULT CALLED TO, READ BACK BY AND VERIFIED WITH:     M.HOPPER,RN AT 2058 BY L.PITT 11/12/13  BODY FLUID CULTURE     Status: None   Collection Time    11/12/13  3:45 PM      Result Value Ref Range Status   Specimen Description PLEURAL FLUID LEFT   Final   Special Requests  6.0ML FLUID   Final   Gram Stain     Final   Value: FEW WBC PRESENT,BOTH PMN AND MONONUCLEAR     NO ORGANISMS SEEN     Performed at [facility]   Culture PENDING   Incomplete   Report Status PENDING   Incomplete    Anti-infectives   Start     Dose/Rate Route Frequency Ordered Stop   11/13/13 0400  vancomycin (VANCOCIN) IVPB 1000 mg/200 mL premix     1,000 mg 200 mL/hr over 60 Minutes Intravenous Every 12 hours 11/12/13 1558     11/13/13 0000  vancomycin (VANCOCIN) IVPB 1000 mg/200 mL premix  Status:  Discontinued     1,000 mg 200 mL/hr over 60 Minutes Intravenous Every 12 hours 11/12/13 1418 11/12/13 1423   11/12/13 2200  piperacillin-tazobactam (ZOSYN) IVPB 3.375 g  Status:  Discontinued     3.375 g 12.5 mL/hr over 240 Minutes Intravenous Every 8 hours 11/12/13 1418 11/12/13 1423   11/12/13 2200  piperacillin-tazobactam (ZOSYN) IVPB 3.375 g     3.375 g 12.5 mL/hr over 240 Minutes Intravenous Every 8 hours 11/12/13 1558     11/12/13 1300  vancomycin (VANCOCIN) IVPB 1000 mg/200 mL premix  1,000 mg 200 mL/hr over 60 Minutes Intravenous  Once 11/12/13 1251 11/12/13 1510   11/12/13 1245  vancomycin (VANCOCIN) injection 1 g  Status:  Discontinued     1 g Intravenous  Once 11/12/13 1243 11/12/13 1251   11/12/13 1245  piperacillin-tazobactam (ZOSYN) IVPB 3.375 g     3.375 g 100 mL/hr over 30 Minutes Intravenous  Once 11/12/13 1243 11/12/13 1403      Assessment: 68 YOF on IV vancomycin and Zosyn for HCAP. Patient actual weight is lower than original estimated weight and will adjust dosing accordingly. SCr 0.69 - may be falsely low due to low body mass. Estimated CrCl ~ 40-50 mL/min. WBC wnl. Afebrile. MRSA PCR +. PCT 0.69, LA 1.3.   7/30 Vanc>> 7/30 Zosyn>>  7/30 Pleural fluid>> Bld x2>> Urine>>  Goal of Therapy:  Vancomycin trough level 15-20 mcg/ml  Plan:  1. Decrease vancomycin 500mg  IV q12h.  2. Continue Zosyn 3.375g IV q8h - 4 hour infusion.  3. Follow-up  cultures, clinical status, and renal function.  4. Plan for vancomycin trough at steady state.   Link Snuffer, PharmD, BCPS Clinical Pharmacist [phone] 11/13/2013,10:09 AM

## 2013-11-13 NOTE — Progress Notes (Signed)
PULMONARY / CRITICAL CARE MEDICINE   Name: [name] MRN: [id number] DOB: 02/22/1946    ADMISSION DATE:  11/12/2013 CONSULTATION DATE:  7/30  REFERRING MD :  Horton   CHIEF COMPLAINT:  Acute respiratory failure   INITIAL PRESENTATION:   This is a 68 year old female who resides at a SNF. Has multiple co-morbids: baseline dementia, Diastolic dysfxn, PVD, DM and right AKA. Admitted on 7/30 after awakening SOB. O2 sats in 60s on 2 liters. On arrival she was found to have a BNP of 7176, large left effusion and pulmonary edema. PCCM was asked to see for acute respiratory failure.   STUDIES:  ECHO 7/30>>> Site: left Date: 7/30  Pleural fluid Serum   LDH: LDH:  Protein: Protein:  Cholesterol:  Cholesterol:  Afb:   Gm stain:    Culture:   Fungal:    Hct:    Color:    Wbc:   Neutrophils:    Lymph:    Ph:    Rheumatoid factor:   Adenosine Deaminase:   Glucose:    Cytology:       Special notes:      SIGNIFICANT EVENTS: 7/30 admitted w/ sudden onset of SOB  7/30- thoracentesis with sanguinous effusion- Persistent congestive heart failure. Persistent left lower lobe consolidation. Left effusion smaller post thoracentesis. Extensive atherosclerotic change.   SUBJECTIVE: No events over night, weaned off of O2, currently needs diet, k and home meds.  VITAL SIGNS: Temp:  [97.4 F (36.3 C)-98.1 F (36.7 C)] 97.5 F (36.4 C) (07/31 0805) Pulse Rate:  [59-60] 60 (07/31 0700) Resp:  [11-30] 12 (07/31 0700) BP: (134-174)/(30-95) 152/46 mmHg (07/31 0700) SpO2:  [91 %-100 %] 100 % (07/31 0700) Weight:  [127 lb 6.8 oz (57.8 kg)-129 lb 10.1 oz (58.8 kg)] 127 lb 13.9 oz (58 kg) (07/31 0600)  HEMODYNAMICS:   VENTILATOR SETTINGS:   INTAKE / OUTPUT:  Intake/Output Summary (Last 24 hours) at 11/13/13 0816 Last data filed at 11/13/13 0700  Gross per 24 hour  Intake  312.5 ml  Output    601 ml  Net -288.5 ml    PHYSICAL EXAMINATION: General:  Chronically ill female in  NAD Neuro:  Awake, alert, follows commands.  HEENT:  Very poor dentition MMM  Cardiovascular:  rrr- pacemaker Lungs:  Rhonchi in Left lower lobe.  Abdomen:  Soft, tender in the epigastric area, Normoactive bowl sounds Musculoskeletal:  Right AKA left foot w/ pressure reducing boot  Skin:  Intact   LABS:  CBC  Recent Labs Lab 11/12/13 1114 11/13/13 0220  WBC 11.8* 5.2  HGB 9.4* 9.1*  HCT 30.2* 28.6*  PLT 303 316   Coag's  Recent Labs Lab 11/12/13 1114  INR 1.07   BMET  Recent Labs Lab 11/12/13 1114 11/13/13 0220  NA 147 148*  K 4.3 4.8  CL 113* 110  CO2 20 21  BUN 26* 26*  CREATININE 0.68 0.69  GLUCOSE 95 127*   Electrolytes  Recent Labs Lab 11/12/13 1114 11/13/13 0220  CALCIUM 8.7 9.2   Sepsis Markers  Recent Labs Lab 11/12/13 1615  LATICACIDVEN 1.3  PROCALCITON 0.69   ABG  Recent Labs Lab 11/12/13 1142  PHART 7.369  PCO2ART 35.5  PO2ART 45.0*   Liver Enzymes No results found for this basename: AST, ALT, ALKPHOS, BILITOT, ALBUMIN,  in the last 168 hours Cardiac Enzymes  Recent Labs Lab 11/12/13 1114 11/12/13 1615 11/12/13 1845 11/13/13 0220  TROPONINI <0.30 <0.30 <0.30 <0.30  PROBNP 7176.0*  --   --  7219.0*   Glucose  Recent Labs Lab 11/12/13 1610 11/12/13 1916 11/13/13 0003 11/13/13 0408  GLUCAP 126* 145* 123* 125*    Imaging Dg Chest Port 1 View  11/12/2013   CLINICAL DATA:  Status post thoracentesis  EXAM: PORTABLE CHEST - 1 VIEW  COMPARISON:  Study obtained earlier in the day  FINDINGS: There is moderate effusion on the left, less than on study obtained earlier in the day. No pneumothorax. There is cardiomegaly with pulmonary venous hypertension and moderate generalized interstitial edema. There is consolidation in the left lower lobe, stable. There is extensive atherosclerotic change in the aorta. Pacemaker leads are attached to the right atrium and right ventricle. There is a stent in the left carotid region.   IMPRESSION: Persistent congestive heart failure. Persistent left lower lobe consolidation. Left effusion smaller post thoracentesis. No pneumothorax. Extensive atherosclerotic change.   Electronically Signed   By: Bretta BangWilliam  Woodruff M.D.   On: 11/12/2013 16:31   Dg Chest Portable 1 View  11/12/2013   CLINICAL DATA:  Shortness of breath and decreased oxygen saturation; history of CVA and coronary artery disease and long-term smoking history  EXAM: PORTABLE CHEST - 1 VIEW  COMPARISON:  PA and lateral chest of November 03, 2010  FINDINGS: The cardiopericardial silhouette is enlarged. The pulmonary vascularity is engorged and indistinct there is right infrahilar atelectasis or pneumonia. There is obscuration of much of the left hemi- thorax secondary to the enlarged cardiac silhouette and likely pleural fluid. A trace of pleural fluid on the right may be present. The permanent pacemaker is in appropriate position. The bony thorax is unremarkable.  IMPRESSION: Congestive heart failure with pulmonary interstitial edema. A moderate to large left pleural effusion is suspected. Right infrahilar atelectasis or pneumonia is suspected.   Electronically Signed   By: David  Swazil[place]   On: 11/12/2013 11:16   ASSESSMENT / PLAN:  PULMONARY  A:  Acute hypoxic respiratory failure > definite pulmonary edema from acute decompensated CHF   Pleural effusion> looked sanguinous- former smoker 41 pack year hx LLL consolidation P:   Transfer to SDU Supplemental oxygen PRN fto keep 02>92% Trend BNP Chest CT without contrast to evaluate lung parenchyma. CXR in am Trend PCT  CARDIOVASCULAR CVL A:  H/o HTN and gd I diastolic dysfxn. EF 55% 2012- BNP 7219.0 Pulmonary edema  Hx endaratarectomy, CAD, pacemaker P:  F/u ECHO Cont home antihypertensives: hydralazine, labetalol, cont statin  Troponin negative Trend BNP EKG unremarkable D/C further lasix at this point  RENAL A:  Mild hypernatremia  P:   F/u chem  Foley  7/30>> Trend lactic acid  GASTROINTESTINAL A:   No acute  Hx Constipation  P:   Heart healthy diet Continue home senna-S  HEMATOLOGIC A:   Anemia of chronic disease Hx hyperlipidemia Elevated D dimer- 5.24 P:  [place] heparin - monitor PT/INR Ct statin Transfuse per ICU guidelines  INFECTIOUS A:   Rule out HCAP MSRA positive  P:   BCx2 7/30>>> UC 7/30>>> Ck Procalcitonin  Pleural fluid cx 7/30 >> Turbid, amber with high WBC- LDH 124, protein 3.1, WBC of 3184, cytology pending Vanc 7/30 >> Zosyn 7/30 >>  ENDOCRINE A:  DM w/ neuropathy  H/o multinodular goiter   P:   SSI  TSH normal  NEUROLOGIC A:  Dementia Hx: stroke  P:   RASS goal: 0  TODAY'S SUMMARY:  This is a 68 year old female who presents in acute hypoxic respiratory failure in setting of pulmonary edema and  large left effusion.  Thora done on 7/30, high WBC, mostly lymphs, likely exudative but serum LDH and proteins not sent, cytology pending, continue abx, titrate O2 down, will transfer to SDU on PCCM's service until pleural effusion is discerned.  I have personally obtained a history, examined the patient, evaluated laboratory and imaging results, formulated the assessment and plan and placed orders.

## 2013-11-13 NOTE — Plan of Care (Signed)
Problem: ICU Phase Progression Outcomes Goal: Voiding-avoid urinary catheter unless indicated Outcome: Not Met (add Reason) Incontinent of urine, foley placed

## 2013-11-14 ENCOUNTER — Inpatient Hospital Stay (HOSPITAL_COMMUNITY): Payer: PRIVATE HEALTH INSURANCE

## 2013-11-14 DIAGNOSIS — I5033 Acute on chronic diastolic (congestive) heart failure: Secondary | ICD-10-CM

## 2013-11-14 DIAGNOSIS — F0390 Unspecified dementia without behavioral disturbance: Secondary | ICD-10-CM | POA: Diagnosis present

## 2013-11-14 DIAGNOSIS — I509 Heart failure, unspecified: Secondary | ICD-10-CM

## 2013-11-14 DIAGNOSIS — J918 Pleural effusion in other conditions classified elsewhere: Secondary | ICD-10-CM

## 2013-11-14 DIAGNOSIS — K922 Gastrointestinal hemorrhage, unspecified: Secondary | ICD-10-CM

## 2013-11-14 DIAGNOSIS — J189 Pneumonia, unspecified organism: Secondary | ICD-10-CM | POA: Diagnosis present

## 2013-11-14 LAB — GLUCOSE, CAPILLARY
Glucose-Capillary: 101 mg/dL — ABNORMAL HIGH (ref 70–99)
Glucose-Capillary: 110 mg/dL — ABNORMAL HIGH (ref 70–99)
Glucose-Capillary: 125 mg/dL — ABNORMAL HIGH (ref 70–99)
Glucose-Capillary: 126 mg/dL — ABNORMAL HIGH (ref 70–99)
Glucose-Capillary: 81 mg/dL (ref 70–99)
Glucose-Capillary: 91 mg/dL (ref 70–99)

## 2013-11-14 LAB — ADENOSINE DEAMINASE, FLUID: ADENOSINE DEAMINASE FL: 3.3 U/L (ref <7.6)

## 2013-11-14 LAB — PROCALCITONIN: PROCALCITONIN: 0.35 ng/mL

## 2013-11-14 LAB — PRO B NATRIURETIC PEPTIDE: PRO B NATRI PEPTIDE: 2953 pg/mL — AB (ref 0–125)

## 2013-11-14 NOTE — Evaluation (Signed)
Physical Therapy Evaluation and Discharge Patient Details Name: [name] MRN: [id number] DOB: [birth year] Today's Date: 11/14/2013   History of Present Illness  Pt is a 68 y/o female who currently resides at [facility]. PMH includes R AKA and currently has a pressure reducing boot on the L foot. Pt presents to [facility] with O2 sats in the 60's on 2L/min supplemental O2, and pt was admitted with acute respiratory failure.   Clinical Impression  Patient evaluated by Physical Therapy with no further acute PT needs identified. All education has been completed and the patient has no further questions. At the time of PT eval pt was nonverbal and was communicating with thumbs-up or thumbs-down. Pt gave a thumbs down when asked if able to transfer OOB at the SNF. Appears that pt is at her baseline of function and is appropriate for use of lift for OOB with nursing staff. See below for any follow-up Physial Therapy or equipment needs. PT is signing off. If needs change, please reconsult.      Follow Up Recommendations SNF;Supervision/Assistance - 24 hour    Equipment Recommendations  Wheelchair (measurements PT);Wheelchair cushion (measurements PT);Hospital bed    Recommendations for Other Services       Precautions / Restrictions Precautions Precautions: Fall Precaution Comments: R AKA, L pressure reducing boot Restrictions Weight Bearing Restrictions: No      Mobility  Bed Mobility Overal bed mobility: Needs Assistance Bed Mobility: Rolling;Sidelying to Sit Rolling: Total assist Sidelying to sit: Total assist;HOB elevated       General bed mobility comments: Hand-over-hand assist for pt to reach for bed rails. Able to grip bed rails but not able to assist in rolling herself or scooting hips to EOB. Bed pad used to assist with scooting/mobility.   Transfers                 General transfer comment: Not assessed due to safety - no +2 help available.  Ambulation/Gait                 Stairs            Wheelchair Mobility    Modified Rankin (Stroke Patients Only)       Balance Overall balance assessment: Needs assistance Sitting-balance support: Feet unsupported;Bilateral upper extremity supported Sitting balance-Leahy Scale: Zero   Postural control: Posterior lean                                   Pertinent Vitals/Pain Vitals stable throughout session.    Home Living Family/patient expects to be discharged to:: Skilled nursing facility                 Additional Comments: Pt nonverbal and was communicating with thumbs up for "yes" and thumbs down for "no".    Prior Function Level of Independence: Needs assistance   Gait / Transfers Assistance Needed: Not ambulating PTA per pt  ADL's / Homemaking Assistance Needed: Pt unable to state her current level of independence with ADL's        Hand Dominance        Extremity/Trunk Assessment   Upper Extremity Assessment: Generalized weakness           Lower Extremity Assessment: Generalized weakness (Able to minimally perform active knee flexion in supine on L)      Cervical / Trunk Assessment: Kyphotic (Overall poor posture/trunk control)  Communication   Communication: Expressive  difficulties  Cognition Arousal/Alertness: Lethargic Behavior During Therapy: Flat affect Overall Cognitive Status: No family/caregiver present to determine baseline cognitive functioning                      General Comments      Exercises        Assessment/Plan    PT Assessment All further PT needs can be met in the next venue of care  PT Diagnosis Generalized weakness   PT Problem List Decreased strength;Decreased range of motion;Decreased activity tolerance;Decreased balance;Decreased mobility;Decreased safety awareness;Decreased knowledge of use of DME;Decreased knowledge of precautions;Decreased skin integrity  PT Treatment Interventions     PT Goals  (Current goals can be found in the Care Plan section) Acute Rehab PT Goals PT Goal Formulation: No goals set, d/c therapy    Frequency     Barriers to discharge        Co-evaluation               End of Session Equipment Utilized During Treatment: Oxygen Activity Tolerance: Patient limited by lethargy Patient left: in bed;with call bell/phone within reach Nurse Communication: Mobility status;Need for lift equipment         Time: 4259-5638 PT Time Calculation (min): 20 min   Charges:   PT Evaluation $Initial PT Evaluation Tier I: 1 Procedure PT Treatments $Therapeutic Activity: 8-22 mins   PT G Codes:          Jolyn Lent 11/14/2013, 12:10 PM  Jolyn Lent, PT, DPT Acute Rehabilitation Services Pager: [phone]

## 2013-11-14 NOTE — Progress Notes (Signed)
PULMONARY / CRITICAL CARE MEDICINE   Name: [name] MRN: [id number] DOB: 1945-06-05    ADMISSION DATE:  11/12/2013 CONSULTATION DATE:  7/30  REFERRING MD :  Horton   CHIEF COMPLAINT:  Acute respiratory failure   INITIAL PRESENTATION:   This is a 68 year old female who resides at a SNF. Has multiple co-morbids: baseline dementia, Diastolic dysfxn, PVD, DM and right AKA. Admitted on 7/30 after awakening SOB. O2 sats in 60s on 2 liters. On arrival she was found to have a BNP of 7176, large left effusion and pulmonary edema. PCCM was asked to see for acute respiratory failure.   STUDIES:  ECHO 7/30>>> Site: left Date: 7/30  Pleural fluid Serum   LDH: 124 LDH:  254  Protein: 3.1 Protein: 6.7  Cholesterol:  Cholesterol:   Afb:   Gm stain:    Culture:   Fungal:    Hct:    Color: amber   Wbc: 3184   Neutrophils: 6   Lymph: 74   Ph:    Rheumatoid factor:   Adenosine Deaminase:   Glucose:    Cytology:       Special notes:      SIGNIFICANT EVENTS: 7/30 admitted w/ sudden onset of SOB  7/30- thoracentesis with sanguinous effusion- Persistent congestive heart failure. Persistent left lower lobe consolidation. Left effusion smaller post thoracentesis. Extensive atherosclerotic change.   SUBJECTIVE: No acute events  VITAL SIGNS: Temp:  [96.9 F (36.1 C)-98 F (36.7 C)] 97.3 F (36.3 C) (08/01 0400) Pulse Rate:  [59-60] 59 (08/01 0900) Resp:  [12-24] 15 (08/01 0900) BP: (130-183)/(37-126) 161/45 mmHg (08/01 0900) SpO2:  [90 %-100 %] 99 % (08/01 0900) FiO2 (%):  [28 %] 28 % (08/01 0823) Weight:  [59.2 kg (130 lb 8.2 oz)] 59.2 kg (130 lb 8.2 oz) (08/01 0629)     VENTILATOR SETTINGS: Vent Mode:  [-]  FiO2 (%):  [28 %] 28 % INTAKE / OUTPUT:  Intake/Output Summary (Last 24 hours) at 11/14/13 1031 Last data filed at 11/14/13 1000  Gross per 24 hour  Intake    570 ml  Output   1005 ml  Net   -435 ml    PHYSICAL EXAMINATION: General:  Chronically ill female  in NAD Neuro:  Awake, alert, follows commands.  HEENT:  Very poor dentition MMM  Cardiovascular:  rrr- pacemaker Lungs:  Rhonchi in Left lower lobe.  Abdomen:  Soft, tender in the epigastric area, Normoactive bowl sounds Musculoskeletal:  Right AKA left foot w/ pressure reducing boot  Skin:  Intact   LABS:  CBC  Recent Labs Lab 11/12/13 1114 11/13/13 0220  WBC 11.8* 5.2  HGB 9.4* 9.1*  HCT 30.2* 28.6*  PLT 303 316   Coag's  Recent Labs Lab 11/12/13 1114  INR 1.07   BMET  Recent Labs Lab 11/12/13 1114 11/13/13 0220 11/13/13 1325  NA 147 148* 148*  K 4.3 4.8 4.2  CL 113* 110 112  CO2 20 21 23   BUN 26* 26* 26*  CREATININE 0.68 0.69 0.71  GLUCOSE 95 127* 79   Electrolytes  Recent Labs Lab 11/12/13 1114 11/13/13 0220 11/13/13 1325  CALCIUM 8.7 9.2 9.0   Sepsis Markers  Recent Labs Lab 11/12/13 1615 11/13/13 1325 11/14/13 0250  LATICACIDVEN 1.3 1.1  --   PROCALCITON 0.69 0.50 0.35   ABG  Recent Labs Lab 11/12/13 1142  PHART 7.369  PCO2ART 35.5  PO2ART 45.0*   Liver Enzymes  Recent Labs Lab 11/13/13 1325  AST 17  ALT 15  ALKPHOS 74  BILITOT 0.2*  ALBUMIN 3.0*   Cardiac Enzymes  Recent Labs Lab 11/12/13 1114 11/12/13 1615 11/12/13 1845 11/13/13 0220 11/14/13 0250  TROPONINI <0.30 <0.30 <0.30 <0.30  --   PROBNP 7176.0*  --   --  7219.0* 2953.0*   Glucose  Recent Labs Lab 11/13/13 0804 11/13/13 1211 11/13/13 1934 11/14/13 0002 11/14/13 0326 11/14/13 0924  GLUCAP 81 79 89 91 81 101*    Imaging Ct Chest Wo Contrast  11/13/2013   CLINICAL DATA:  Evacuation for infiltrates  EXAM: CT CHEST WITHOUT CONTRAST  TECHNIQUE: Multidetector CT imaging of the chest was performed following the standard protocol without IV contrast.  COMPARISON:  Prior chest x-ray 11/13/2013 CT; prior CT abdomen/ pelvis including the lung bases 07/11/2010  FINDINGS: Mediastinum: Heterogeneous soft tissue density in the region of the left thoracic  inlet is incompletely evaluated but favored to represent not judged nodular extension of thyroid goiter into the superior mediastinum. The maximal transverse dimensions are 4.1 x 3.0 cm. No definite mediastinal adenopathy although evaluation is limited in the absence of intravenous contrast. Unremarkable thoracic esophagus.  Heart/Vascular: Large loculated pericardial effusion. The preponderance of the fluid volume is posterior and inferior to the left ventricle. Left subclavian approach cardiac rhythm maintenance device. Leads project over the right atrium and in the right ventricular apex. Cardiomegaly. Atherosclerotic calcifications throughout the aorta and coronary arteries. The main pulmonary artery is enlarged at 3.1 cm in diameter suggesting underlying pulmonary arterial hypertension. Overall evaluation of vascular structures is limited in the absence of intravenous contrast.  Lungs/Pleura: Background of centrilobular emphysema. Diffuse interstitial prominence throughout the lungs most consistent with interstitial edema. Small bilateral layering pleural effusions with associated passive atelectasis. There are a few areas of more patchy airspace opacity in the right lower lobe which could reflect early bronchopneumonia.  Bones/Soft Tissues: No acute fracture or aggressive appearing lytic or blastic osseous lesion. Diastases recti noted in the upper abdomen. 1 cm low-attenuation superficial cystic structure in the subcutaneous fat immediately underlying the skin anterior to the sternum likely reflects a sebaceous cyst.  Upper Abdomen: Aortic atherosclerotic calcifications. Otherwise, unremarkable.  IMPRESSION: 1. Interstitial pulmonary edema and small bilateral pleural effusions are most consistent with mild -moderate CHF versus volume overload. 2. Foci of patchy ground-glass attenuation opacity in a peribronchovascular distribution in the right lower lobe may reflect early bronchopneumonia or areas of  developing alveolar edema. 3. Large, loculated pericardial effusion. The majority of the fluid volume is posterior and inferior to the left ventricle. 4. Cardiomegaly. 5. Diffuse atherosclerosis including coronary artery disease. 6. Incompletely imaged and somewhat a amorphous soft tissue density in the left aspect of the thoracic inlet extending into the superior mediastinum is favored to reflect mediastinal extension of a thyroid goiter. Consider dedicated thyroid ultrasound for further evaluation. 7. Enlarged main pulmonary artery as can be seen in the setting of pulmonary arterial hypertension.   Electronically Signed   By: Malachy Moan M.D.   On: 11/13/2013 16:40   Dg Chest Port 1 View  11/13/2013   CLINICAL DATA:  Pleural effusion.  EXAM: PORTABLE CHEST - 1 VIEW  COMPARISON:  11/12/2013.  FINDINGS: Mediastinum and hilar structures normal. Severe cardiomegaly. Cardiac pacer lead tips in right atrium and right ventricle. Interim partial resolution of pulmonary edema. Dense infiltrate versus atelectasis left lung base. No pleural effusion.  IMPRESSION: 1. Severe stable cardiomegaly.  Partial clearing of pulmonary edema. 2. Dense infiltrate and or atelectasis  left lung base. Associated left pleural effusion cannot be excluded.   Electronically Signed   By: Maisie Fushomas  Register   On: 11/13/2013 08:25   ASSESSMENT / PLAN:  PULMONARY  A:  Acute hypoxic respiratory failure > definite pulmonary edema from acute decompensated CHF   Exudative Pleural effusion> ?etiology, probable parapneumonic pack year hx LLL consolidation P:   Transfer to SDU Supplemental oxygen PRN fto keep 02>92% Trend BNP. Trend PCT  CARDIOVASCULAR CVL A:  H/o HTN and gd I diastolic dysfxn. EF 55% 2012- BNP 7219.0 Pulmonary edema  Hx endaratarectomy, CAD, pacemaker P:  F/u ECHO Cont home antihypertensives: hydralazine, labetalol, cont statin  Troponin negative Trend BNP EKG unremarkable D/C further lasix at this  point  RENAL A:  Mild hypernatremia  P:   F/u chem  Foley 7/30>> Trend lactic acid  GASTROINTESTINAL A:   No acute  Hx Constipation  P:   Heart healthy diet Continue home senna-S  HEMATOLOGIC A:   Anemia of chronic disease Hx hyperlipidemia Elevated D dimer- 5.24 P:  [place] heparin - monitor PT/INR Ct statin Transfuse per ICU guidelines  INFECTIOUS A:   Rule out HCAP MSRA positive  P:   BCx2 7/30>>> UC 7/30>>> Ck Procalcitonin  Pleural fluid cx 7/30 >> Turbid, amber with high WBC- LDH 124, protein 3.1, WBC of 3184, cytology pending Vanc 7/30 >> Zosyn 7/30 >>  ENDOCRINE A:  DM w/ neuropathy  H/o multinodular goiter   P:   SSI  TSH normal  NEUROLOGIC A:  Dementia Hx: stroke  P:   RASS goal: 0  TODAY'S SUMMARY:  This is a 68 year old female who presents in acute hypoxic respiratory failure in setting of pulmonary edema and large left effusion.  Thora done on 7/30, exudative pleural effusion .   I have personally obtained a history, examined the patient, evaluated laboratory and imaging results, formulated the assessment and plan and placed orders.  Dorcas Carrowatrick WrightMD Beeper  [phone]  Cell  [phone]  If no response or cell goes to voicemail, call beeper [phone]

## 2013-11-15 DIAGNOSIS — J90 Pleural effusion, not elsewhere classified: Secondary | ICD-10-CM

## 2013-11-15 LAB — GLUCOSE, CAPILLARY
GLUCOSE-CAPILLARY: 96 mg/dL (ref 70–99)
Glucose-Capillary: 100 mg/dL — ABNORMAL HIGH (ref 70–99)
Glucose-Capillary: 134 mg/dL — ABNORMAL HIGH (ref 70–99)
Glucose-Capillary: 85 mg/dL (ref 70–99)
Glucose-Capillary: 90 mg/dL (ref 70–99)
Glucose-Capillary: 93 mg/dL (ref 70–99)

## 2013-11-15 LAB — URINE CULTURE
Colony Count: NO GROWTH
Culture: NO GROWTH

## 2013-11-15 LAB — ANA, BODY FLUID: ANTI-NUCLEAR AB, IGG: NOT DETECTED

## 2013-11-15 LAB — PROCALCITONIN: PROCALCITONIN: 0.31 ng/mL

## 2013-11-15 MED ORDER — LABETALOL HCL 300 MG PO TABS
300.0000 mg | ORAL_TABLET | Freq: Three times a day (TID) | ORAL | Status: DC
  Administered 2013-11-15 – 2013-11-17 (×7): 300 mg via ORAL
  Filled 2013-11-15 (×9): qty 1

## 2013-11-15 MED ORDER — INSULIN ASPART 100 UNIT/ML ~~LOC~~ SOLN: 0.0000 [IU] | Freq: Three times a day (TID) | SUBCUTANEOUS | Status: DC

## 2013-11-15 NOTE — Progress Notes (Signed)
PULMONARY / CRITICAL CARE MEDICINE   Name: [name] MRN: [id number] DOB: May 13, 1945    ADMISSION DATE:  11/12/2013 CONSULTATION DATE:  7/30  REFERRING MD :  Horton   CHIEF COMPLAINT:  Acute respiratory failure   INITIAL PRESENTATION:   This is a 68 year old female who resides at a SNF. Has multiple co-morbids: baseline dementia, Diastolic dysfxn, PVD, DM and right AKA. Admitted on 7/30 after awakening SOB. O2 sats in 60s on 2 liters. On arrival she was found to have a BNP of 7176, large left effusion and pulmonary edema. PCCM was asked to see for acute respiratory failure.   STUDIES:  ECHO 7/30>>>SIGNIFICANT EVENTS: 7/30 admitted w/ sudden onset of SOB  7/30- thoracentesis with sanguinous effusion- Persistent congestive heart failure. Persistent left lower lobe consolidation. Left effusion smaller post thoracentesis. Extensive atherosclerotic change.   SUBJECTIVE: No acute events, bp high  VITAL SIGNS: Temp:  [98 F (36.7 C)-99.5 F (37.5 C)] 99 F (37.2 C) (08/02 0425) Pulse Rate:  [59-60] 60 (08/02 0635) Resp:  [15-32] 17 (08/02 0600) BP: (107-187)/(27-86) 175/41 mmHg (08/02 0635) SpO2:  [90 %-100 %] 91 % (08/02 0814) FiO2 (%):  [21 %-28 %] 21 % (08/02 0814) Weight:  [59.2 kg (130 lb 8.2 oz)] 59.2 kg (130 lb 8.2 oz) (08/02 0425)     VENTILATOR SETTINGS: Vent Mode:  [-]  FiO2 (%):  [21 %-28 %] 21 % INTAKE / OUTPUT:  Intake/Output Summary (Last 24 hours) at 11/15/13 0817 Last data filed at 11/15/13 0426  Gross per 24 hour  Intake 289.83 ml  Output    802 ml  Net -512.17 ml    PHYSICAL EXAMINATION: General:  Chronically ill female in NAD Neuro:  Awake, alert, follows commands.  HEENT:  Very poor dentition MMM  Cardiovascular:  rrr- pacemaker Lungs:  Clearer  in Left lower lobe.  Abdomen:  Soft, tender in the epigastric area, Normoactive bowl sounds Musculoskeletal:  Right AKA left foot w/ pressure reducing boot  Skin:  Intact    LABS:  CBC  Recent Labs Lab 11/12/13 1114 11/13/13 0220  WBC 11.8* 5.2  HGB 9.4* 9.1*  HCT 30.2* 28.6*  PLT 303 316   Coag's  Recent Labs Lab 11/12/13 1114  INR 1.07   BMET  Recent Labs Lab 11/12/13 1114 11/13/13 0220 11/13/13 1325  NA 147 148* 148*  K 4.3 4.8 4.2  CL 113* 110 112  CO2 20 21 23   BUN 26* 26* 26*  CREATININE 0.68 0.69 0.71  GLUCOSE 95 127* 79   Electrolytes  Recent Labs Lab 11/12/13 1114 11/13/13 0220 11/13/13 1325  CALCIUM 8.7 9.2 9.0   Sepsis Markers  Recent Labs Lab 11/12/13 1615 11/13/13 1325 11/14/13 0250 11/15/13 0232  LATICACIDVEN 1.3 1.1  --   --   PROCALCITON 0.69 0.50 0.35 0.31   ABG  Recent Labs Lab 11/12/13 1142  PHART 7.369  PCO2ART 35.5  PO2ART 45.0*   Liver Enzymes  Recent Labs Lab 11/13/13 1325  AST 17  ALT 15  ALKPHOS 74  BILITOT 0.2*  ALBUMIN 3.0*   Cardiac Enzymes  Recent Labs Lab 11/12/13 1114 11/12/13 1615 11/12/13 1845 11/13/13 0220 11/14/13 0250  TROPONINI <0.30 <0.30 <0.30 <0.30  --   PROBNP 7176.0*  --   --  7219.0* 2953.0*   Glucose  Recent Labs Lab 11/14/13 0924 11/14/13 1121 11/14/13 1618 11/14/13 2054 11/15/13 0005 11/15/13 0423  GLUCAP 101* 126* 110* 125* 90 96    Imaging Dg  Chest Port 1 View  11/14/2013   CLINICAL DATA:  Effusion.  EXAM: PORTABLE CHEST - 1 VIEW  COMPARISON:  Chest CT and radiograph 11/13/2013  FINDINGS: Marked enlargement of the cardiac silhouette is unchanged from the prior radiograph, corresponding to a large pericardial effusion on CT. Left-sided dual lead pacemaker remains in place. Thoracic aortic calcification is noted. Pulmonary vascular congestion with mild interstitial pulmonary edema does not appear significantly changed. There is minimally improved aeration of the right lung base. Dense left basilar opacity process, likely atelectasis. Small pleural effusions on recent CT are not well demonstrated on this radiograph. No pneumothorax is  seen.  IMPRESSION: 1. Unchanged enlargement of the cardiac silhouette. 2. Unchanged, mild interstitial edema.  Left basilar atelectasis.   Electronically Signed   By: Sebastian AcheAllen  Grady   On: 11/14/2013 07:30   ASSESSMENT / PLAN:  PULMONARY  A:  Acute hypoxic respiratory failure > definite pulmonary edema from acute decompensated CHF   Exudative Pleural effusion>  parapneumonic d/t PNA HCAP LLL PNA NOS, PCT falling  P:   Transfer to tele Supplemental oxygen PRN fto keep 02>92% BDs  CARDIOVASCULAR CVL A:  H/o HTN and gd I diastolic dysfxn. EF 55% on current echo  Pericardial effusion, NOT tamponade physiology Pulmonary edema  Hx endaratarectomy, CAD, pacemaker P:  Cont home antihypertensives: hydralazine, labetalol, cont statin , increase labatalol back to home dose  RENAL A:  Mild hypernatremia  P:   F/u chem  Foley 7/30>> Trend lactic acid  GASTROINTESTINAL A:   No acute  Hx Constipation  P:   Heart healthy diet Continue home senna-S  HEMATOLOGIC A:   Anemia of chronic disease Hx hyperlipidemia Elevated D dimer- 5.24 P:  [place] heparin - monitor PT/INR Ct statin Transfuse per ICU guidelines  INFECTIOUS A:   Rule out HCAP MSRA positive  P:   BCx2 7/30>>> UC 7/30>>> Pleural fluid cx 7/30 >> Turbid, amber with high WBC- LDH 124, protein 3.1, WBC of 3184, cytology pending, No org seen, NGTD Vanc 7/30 >>8/2 Zosyn 7/30 >>  Narrow ABX  ENDOCRINE A:  DM w/ neuropathy  H/o multinodular goiter   P:   SSI  TSH normal  NEUROLOGIC A:  Dementia Hx: stroke  P:   RASS goal: 0  TODAY'S SUMMARY:  This is a 68 year old female who presents in acute hypoxic respiratory failure in setting of pulmonary edema and large left effusion.  Thora done on 7/30, exudative pleural effusion . Plan to tfr to floor, call TR[facility] to assume care 8/3.  Narrow Abx to zosyn alone.    I have personally obtained a history, examined the patient, evaluated laboratory and imaging results,  formulated the assessment and plan and placed orders.  Dorcas Carrowatrick WrightMD Beeper  [phone]  Cell  [phone]  If no response or cell goes to voicemail, call beeper [phone]

## 2013-11-15 NOTE — Progress Notes (Signed)
Dr. Delford FieldWright made aware of B/P's 174-186/48-60. No new orders other than to continue monitoring.

## 2013-11-15 NOTE — Progress Notes (Signed)
Patient transferred to 2W35 stable on Room air.Ms [name] (Daughter) called with room update.

## 2013-11-16 ENCOUNTER — Telehealth (HOSPITAL_COMMUNITY): Payer: Self-pay | Admitting: Cardiovascular Disease

## 2013-11-16 LAB — CBC WITH DIFFERENTIAL/PLATELET
BASOS PCT: 0 % (ref 0–1)
Basophils Absolute: 0 10*3/uL (ref 0.0–0.1)
Eosinophils Absolute: 0.2 10*3/uL (ref 0.0–0.7)
Eosinophils Relative: 2 % (ref 0–5)
HEMATOCRIT: 32.2 % — AB (ref 36.0–46.0)
Hemoglobin: 10.2 g/dL — ABNORMAL LOW (ref 12.0–15.0)
LYMPHS ABS: 2.5 10*3/uL (ref 0.7–4.0)
Lymphocytes Relative: 21 % (ref 12–46)
MCH: 27.6 pg (ref 26.0–34.0)
MCHC: 31.7 g/dL (ref 30.0–36.0)
MCV: 87.3 fL (ref 78.0–100.0)
MONO ABS: 0.6 10*3/uL (ref 0.1–1.0)
Monocytes Relative: 5 % (ref 3–12)
Neutro Abs: 8.4 10*3/uL — ABNORMAL HIGH (ref 1.7–7.7)
Neutrophils Relative %: 72 % (ref 43–77)
Platelets: 321 10*3/uL (ref 150–400)
RBC: 3.69 MIL/uL — ABNORMAL LOW (ref 3.87–5.11)
RDW: 15.1 % (ref 11.5–15.5)
WBC: 11.7 10*3/uL — ABNORMAL HIGH (ref 4.0–10.5)

## 2013-11-16 LAB — BASIC METABOLIC PANEL
ANION GAP: 17 — AB (ref 5–15)
BUN: 13 mg/dL (ref 6–23)
CO2: 21 meq/L (ref 19–32)
CREATININE: 0.72 mg/dL (ref 0.50–1.10)
Calcium: 8.7 mg/dL (ref 8.4–10.5)
Chloride: 107 mEq/L (ref 96–112)
GFR calc Af Amer: >90 mL/min (ref >90)
GFR calc non Af Amer: 85 mL/min — ABNORMAL LOW (ref >90)
Glucose, Bld: 80 mg/dL (ref 70–99)
POTASSIUM: 3.2 meq/L — AB (ref 3.7–5.3)
Sodium: 145 mEq/L (ref 137–147)

## 2013-11-16 LAB — GLUCOSE, CAPILLARY
GLUCOSE-CAPILLARY: 112 mg/dL — AB (ref 70–99)
GLUCOSE-CAPILLARY: 97 mg/dL (ref 70–99)
GLUCOSE-CAPILLARY: 90 mg/dL (ref 70–99)
Glucose-Capillary: 96 mg/dL (ref 70–99)

## 2013-11-16 LAB — CLOSTRIDIUM DIFFICILE BY PCR: CDIFFPCR: NEGATIVE

## 2013-11-16 LAB — BODY FLUID CULTURE: Culture: NO GROWTH

## 2013-11-16 MED ORDER — AMOXICILLIN-POT CLAVULANATE 875-125 MG PO TABS
1.0000 | ORAL_TABLET | Freq: Two times a day (BID) | ORAL | Status: DC
  Administered 2013-11-16 – 2013-11-17 (×3): 1 via ORAL
  Filled 2013-11-16 (×4): qty 1

## 2013-11-16 MED ORDER — AMLODIPINE BESYLATE 10 MG PO TABS
10.0000 mg | ORAL_TABLET | Freq: Every day | ORAL | Status: DC
  Administered 2013-11-16 – 2013-11-17 (×2): 10 mg via ORAL
  Filled 2013-11-16 (×2): qty 1

## 2013-11-16 MED ORDER — PRAVASTATIN SODIUM 40 MG PO TABS
80.0000 mg | ORAL_TABLET | Freq: Every day | ORAL | Status: DC
  Filled 2013-11-16 (×2): qty 2

## 2013-11-16 MED ORDER — POTASSIUM CHLORIDE CRYS ER 20 MEQ PO TBCR
40.0000 meq | EXTENDED_RELEASE_TABLET | ORAL | Status: AC
  Administered 2013-11-16: 40 meq via ORAL
  Filled 2013-11-16 (×3): qty 2

## 2013-11-16 NOTE — Telephone Encounter (Addendum)
11-16-13 pt's device was checked while in hospital on 11-13-13/mt

## 2013-11-16 NOTE — Evaluation (Signed)
Clinical/Bedside Swallow Evaluation Patient Details  Name: [name] MRN: [id number] Date of Birth: 02-07-1946  Today's Date: 11/16/2013 Time: 1110-1122 SLP Time Calculation (min): 12 min  Past Medical History:  Past Medical History  Diagnosis Date  . Diabetes mellitus   . Hypertension   . Stroke   . Hyperlipidemia   . Anemia   . CAD (coronary artery disease)   . Arthritis   . Ulcer   . Hypernatremia   . Hypokalemia   . Carotid artery occlusion    Past Surgical History:  Past Surgical History  Procedure Laterality Date  . Above knee leg amputation      RIGHT AKA  . Carotid endarterectomy    . Peripheral arterial stent graft  03/16/2002    PTA & stenting of the left iliac artery  . Cardiac catheterization  04/10/2002    Normal  . Carotid artery angioplasty  02/22/2009    PTA & stenting [facility] Protocol  . Permanent pacemaker insertion  08/08/2010    [employer]  . Nm myocar perf wall motion  04/07/2009    normal   HPI:  This is a 68 year old female who resides at a SNF. Has multiple co-morbids: baseline dementia, Diastolic dysfxn, PVD, DM and right AKA. Admitted on 7/30 after awakening SOB. O2 sats in 60s on 2 liters. On arrival she was found to have a large left effusion and pulmonary edema. Remoted history of mild dyspahgia following CVA in 2012, MBS recommended dys 2/nectar thick liquids. Currently MD concerned for recurrent aspiration.    Assessment / Plan / Recommendation Clinical Impression  Pt does not show subjective evidence of aspiration. Se does have a mild cognitive based oral dysphagia, also impacted by poor dentition, with decreased opening of mouth for bolus, but oral transit is otherwise WFL. Pt also with subjective slight delay in swallow initiation, but again, no signs of aspiration. Recommend pt continue a puree diet with thin liquids. Would not suggest further testing unless aspiration pna is highly suspected to be the cause of pts pna. Will defer to MD  regarding f/u. Otherwise, SLP will sign off.     Aspiration Risk  Mild    Diet Recommendation Dysphagia 1 (Puree);Thin liquid   Liquid Administration via: Cup;Straw Medication Administration: Whole meds with liquid Supervision: Staff to assist with self feeding Postural Changes and/or Swallow Maneuvers: Seated upright 90 degrees    Other  Recommendations Oral Care Recommendations: Oral care BID   Follow Up Recommendations  None    Frequency and Duration        Pertinent Vitals/Pain NA    SLP Swallow Goals     Swallow Study Prior Functional Status       General HPI: This is a 68 year old female who resides at a SNF. Has multiple co-morbids: baseline dementia, Diastolic dysfxn, PVD, DM and right AKA. Admitted on 7/30 after awakening SOB. O2 sats in 60s on 2 liters. On arrival she was found to have a large left effusion and pulmonary edema. Remoted history of mild dyspahgia following CVA in 2012, MBS recommended dys 2/nectar thick liquids. Currently MD concerned for recurrent aspiration.  Type of Study: Bedside swallow evaluation Previous Swallow Assessment: se HPI Diet Prior to this Study: Dysphagia 1 (puree);Thin liquids Temperature Spikes Noted: No Respiratory Status: Nasal cannula History of Recent Intubation: No Behavior/Cognition: Alert;Doesn't follow directions Oral Cavity - Dentition: Poor condition Self-Feeding Abilities: Needs assist Patient Positioning: Upright in bed Baseline Vocal Quality: Low vocal intensity Volitional  Cough: Cognitively unable to elicit Volitional Swallow: Unable to elicit    Oral/Motor/Sensory Function Overall Oral Motor/Sensory Function: Other (comment) (Pt will not follow commands)   Ice Chips     Thin Liquid Thin Liquid: Impaired Presentation: Straw Pharyngeal  Phase Impairments: Suspected delayed Swallow    Nectar Thick Nectar Thick Liquid: Not tested   Honey Thick Honey Thick Liquid: Not tested   Puree Puree:  Impaired Presentation: Spoon Oral Phase Impairments:  (limited opening of mouth for bolus)   Solid   GO    Solid: Impaired Oral Phase Impairments: Impaired mastication Oral Phase Functional Implications: Oral residue      [facility] Regional Medical CenterBonnie Mariachristina Holle, MA CCC-SLP [phone]  [name], [name] 11/16/2013,11:34 AM

## 2013-11-16 NOTE — Progress Notes (Signed)
PT Cancellation Note  Patient Details Name: Maureen LightningVelma V Kingsberry MRN: [id number] DOB: 1946-02-13   Cancelled Treatment:    Reason Eval/Treat Not Completed: Other (comment) (Pt eval'd and d/c'd previously - at baseline level. MD aware)   INGOLD,Klaudia Beirne 11/16/2013, 3:22 PM [facility] Surgery CenterDawn Ingold,PT Acute Rehabilitation [phone] (pager)

## 2013-11-16 NOTE — Progress Notes (Signed)
Placed on enteric precautions and Cdiff PCR sample sent per nursing protocol after 3 loose stools on same shift.  Will report thoroughly.

## 2013-11-16 NOTE — Progress Notes (Signed)
Patient Demographics  [name], is a 68 y.o. female, DOB - [birth year], ZOX:[id number]  Admit date - 11/12/2013   Admitting Physician Lupita Leash, MD  Outpatient Primary MD for the patient is Georgann Housekeeper, MD  LOS - 4   Chief Complaint  Patient presents with  . Shortness of Breath       Summary  This is a 68 year old female who resides at a SNF. Has multiple co-morbids: baseline dementia, Diastolic dysfxn, PVD, DM and right AKA. Admitted on 7/30 after awakening SOB. O2 sats in 60s on 2 liters. On arrival she was found to have a BNP of 7176, large left effusion and pulmonary edema. PCCM was asked to see for acute respiratory failure. She was under the care of pulmonary critical care underwent thoracentesis fluid was suggestive of parapneumonic exudate, she likely had recurrent aspiration HCAP, was treated with IV antibiotics stabilized and transferred to hospitalist service on 11/16/2013.     Subjective:   [name] today has, No headache, No chest pain, No abdominal pain - No Nausea, No new weakness tingling or numbness, No Cough - SOB.  But is a very unreliable historian  Assessment & Plan    1. Acute respiratory failure due to aspiration at CAP with left-sided parapneumonic effusion along with mild acute on chronic diastolic CHF EF 50-55%. Status post left-sided thoracentesis and antibiotic treatment under pulmonary critical care for the last few days, now transferred to hospitalist service on 11/16/2013. Afebrile with stable blood work, transition to oral Augmentin on a tapering 15, will have speech evaluate as underlying dysphagia seems to be an issue. Currently on dysphagia 1 diet.  o2 and nebulizer treatment as needed.    2. Underlying dementia. Poor historian, at risk  for delirium and aspiration, supportive care with aspiration precautions. Avoid benzodiazepine.     3. Severe PAD requiring right AKA. Continue supportive care, on statin for secondary prevention.     4. Dyslipidemia on statin continue.     5. Acute on chronic diastolic CHF EF 50-55%. Good status compensated, not on any diuretic monitor.     6. 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 Currently on sliding scale insulin, monitor CBGs and adjust. On Lyrica for peripheral neuropathy.   Lab Results  Component Value Date   HGBA1C 5.3 09/28/2010    CBG (last 3)   Recent Labs  11/15/13 1601 11/15/13 2117 11/16/13 0630  GLUCAP 85 93 96      7. Hypertension stable on beta blocker hydralazine which will be continued. Norvasc for better control.     8. Low potassium. Replace and recheck.      Code Status: Full  Family Communication: None present   Disposition Plan: SNF   Procedures   ECHO 7/30>>>SIGNIFICANT EVENTS:   7/30 admitted w/ sudden onset of SOB    TTE  - Left ventricle: The cavity size was normal. Wall thickness was increased in a pattern of mild LVH. Systolic function was normal. The estimated ejection fraction was in the range of 50% to 55%. Wall motion was normal; there were no regional wall motion abnormalities. - Pericardium, extracardiac: A moderate to large, free-flowing pericardial effusion was identified circumferential to the heart. Features were not consistent with  tamponade physiology.    7/30- thoracentesis with sanguinous effusion- Persistent congestive heart failure. Persistent left lower lobe consolidation. Left effusion smaller post thoracentesis. Extensive atherosclerotic change.     Consults PCCM   Medications  Scheduled Meds: . albuterol  2.5 mg Nebulization BID  . amoxicillin-clavulanate  1 tablet Oral Q12H  . antiseptic oral rinse  7 mL Mouth Rinse BID  . Chlorhexidine Gluconate Cloth  6 each  Topical Q0600  . feeding supplement (ENSURE COMPLETE)  237 mL Oral BID BM  . ferrous sulfate  300 mg Oral Q breakfast  . heparin  5,000 Units Subcutaneous 3 times per day  . hydrALAZINE  50 mg Oral QID  . insulin aspart  0-15 Units Subcutaneous TID AC & HS  . labetalol  300 mg Oral 3 times per day  . mupirocin ointment  1 application Nasal BID  . potassium chloride  40 mEq Oral Q4H  . pregabalin  50 mg Oral TID  . senna-docusate  2 tablet Oral BID  . simvastatin  40 mg Oral q1800   Continuous Infusions:  PRN Meds:.sodium chloride  DVT Prophylaxis    Heparin   Lab Results  Component Value Date   PLT 321 11/16/2013    Antibiotics     Anti-infectives   Start     Dose/Rate Route Frequency Ordered Stop   11/16/13 1000  amoxicillin-clavulanate (AUGMENTIN) 875-125 MG per tablet 1 tablet     1 tablet Oral Every 12 hours 11/16/13 0951     11/13/13 1600  vancomycin (VANCOCIN) 500 mg in sodium chloride 0.9 % 100 mL IVPB  Status:  Discontinued     500 mg 100 mL/hr over 60 Minutes Intravenous Every 12 hours 11/13/13 1014 11/15/13 0815   11/13/13 0400  vancomycin (VANCOCIN) IVPB 1000 mg/200 mL premix  Status:  Discontinued     1,000 mg 200 mL/hr over 60 Minutes Intravenous Every 12 hours 11/12/13 1558 11/13/13 1014   11/13/13 0000  vancomycin (VANCOCIN) IVPB 1000 mg/200 mL premix  Status:  Discontinued     1,000 mg 200 mL/hr over 60 Minutes Intravenous Every 12 hours 11/12/13 1418 11/12/13 1423   11/12/13 2200  piperacillin-tazobactam (ZOSYN) IVPB 3.375 g  Status:  Discontinued     3.375 g 12.5 mL/hr over 240 Minutes Intravenous Every 8 hours 11/12/13 1418 11/12/13 1423   11/12/13 2200  piperacillin-tazobactam (ZOSYN) IVPB 3.375 g  Status:  Discontinued     3.375 g 12.5 mL/hr over 240 Minutes Intravenous Every 8 hours 11/12/13 1558 11/16/13 0951   11/12/13 1300  vancomycin (VANCOCIN) IVPB 1000 mg/200 mL premix     1,000 mg 200 mL/hr over 60 Minutes Intravenous  Once 11/12/13 1251  11/12/13 1510   11/12/13 1245  vancomycin (VANCOCIN) injection 1 g  Status:  Discontinued     1 g Intravenous  Once 11/12/13 1243 11/12/13 1251   11/12/13 1245  piperacillin-tazobactam (ZOSYN) IVPB 3.375 g     3.375 g 100 mL/hr over 30 Minutes Intravenous  Once 11/12/13 1243 11/12/13 1403          Objective:   Filed Vitals:   11/15/13 1948 11/15/13 2229 11/16/13 0448 11/16/13 0749  BP: 155/59  169/48   Pulse: 60 60 60 60  Temp: 97.7 F (36.5 C)  98.7 F (37.1 C)   TempSrc: Tympanic  Oral   Resp: 20  18 19   Height:      Weight:   61.1 kg (134 lb 11.2 oz)   SpO2: 94%  98% 97%    Wt Readings from Last 3 Encounters:  11/16/13 61.1 kg (134 lb 11.2 oz)  03/19/11 72.576 kg (160 lb)  02/23/10 83.235 kg (183 lb 8 oz)     Intake/Output Summary (Last 24 hours) at 11/16/13 0952 Last data filed at 11/16/13 0819  Gross per 24 hour  Intake    110 ml  Output    303 ml  Net   -193 ml     Physical Exam  Awake but not alert, unreliable historian, No new F.N deficits, Normal affect [place].AT,PERRAL Supple Neck,No JVD, No cervical lymphadenopathy appriciated.  Symmetrical Chest wall movement, Good air movement bilaterally, CTAB RRR,No Gallops,Rubs or new Murmurs, No Parasternal Heave +ve B.Sounds, Abd Soft, No tenderness, No organomegaly appriciated, No rebound - guarding or rigidity. No Cyanosis, Clubbing or edema, No new Rash or bruise, right AKA,   Data Review   Micro Results Recent Results (from the past 240 hour(s))  MRSA PCR SCREENING     Status: Abnormal   Collection Time    11/12/13  3:45 PM      Result Value Ref Range Status   MRSA by PCR POSITIVE (*) NEGATIVE Final   Comment:            The GeneXpert MRSA Assay (FDA     approved for NASAL specimens     only), is one component of a     comprehensive MRSA colonization     surveillance program. It is not     intended to diagnose MRSA     infection nor to guide or     monitor treatment for     MRSA infections.      RESULT CALLED TO, READ BACK BY AND VERIFIED WITH:     M.HOPPER,RN AT 2058 BY L.PITT 11/12/13  AFB CULTURE WITH SMEAR     Status: None   Collection Time    11/12/13  3:45 PM      Result Value Ref Range Status   Specimen Description PLEURAL FLUID LEFT   Final   Special Requests 6.0ML   Final   Acid Fast Smear     Final   Value: NO ACID FAST BACILLI SEEN     Performed at [facility]   Culture     Final   Value: CULTURE WILL BE EXAMINED FOR 6 WEEKS BEFORE ISSUING A FINAL REPORT     Performed at [facility]   Report Status PENDING   Incomplete  BODY FLUID CULTURE     Status: None   Collection Time    11/12/13  3:45 PM      Result Value Ref Range Status   Specimen Description PLEURAL FLUID LEFT   Final   Special Requests 6.0ML FLUID   Final   Gram Stain     Final   Value: FEW WBC PRESENT,BOTH PMN AND MONONUCLEAR     NO ORGANISMS SEEN     Performed at [facility]   Culture     Final   Value: NO GROWTH 2 DAYS     Performed at [facility]   Report Status PENDING   Incomplete  FUNGUS CULTURE W SMEAR     Status: None   Collection Time    11/12/13  3:45 PM      Result Value Ref Range Status   Specimen Description PLEURAL FLUID LEFT   Final   Special Requests 6.0ML   Final   Fungal Smear  Final   Value: NO YEAST OR FUNGAL ELEMENTS SEEN     Performed at [facility]   Culture     Final   Value: CULTURE IN PROGRESS FOR FOUR WEEKS     Performed at [facility]   Report Status PENDING   Incomplete  CULTURE, BLOOD (ROUTINE X 2)     Status: None   Collection Time    11/12/13  3:55 PM      Result Value Ref Range Status   Specimen Description BLOOD RIGHT ARM   Final   Special Requests BOTTLES DRAWN AEROBIC AND ANAEROBIC 5CC   Final   Culture  Setup Time     Final   Value: 11/13/2013 00:28     Performed at [facility]   Culture     Final   Value:        BLOOD CULTURE RECEIVED NO GROWTH TO DATE CULTURE WILL BE HELD FOR 5  DAYS BEFORE ISSUING A FINAL NEGATIVE REPORT     Performed at [facility]   Report Status PENDING   Incomplete  CULTURE, BLOOD (ROUTINE X 2)     Status: None   Collection Time    11/12/13  4:15 PM      Result Value Ref Range Status   Specimen Description BLOOD RIGHT HAND   Final   Special Requests BOTTLES DRAWN AEROBIC AND ANAEROBIC 5CC   Final   Culture  Setup Time     Final   Value: 11/13/2013 00:27     Performed at [facility]   Culture     Final   Value:        BLOOD CULTURE RECEIVED NO GROWTH TO DATE CULTURE WILL BE HELD FOR 5 DAYS BEFORE ISSUING A FINAL NEGATIVE REPORT     Performed at [facility]   Report Status PENDING   Incomplete  URINE CULTURE     Status: None   Collection Time    11/14/13  3:40 AM      Result Value Ref Range Status   Specimen Description URINE, CATHETERIZED   Final   Special Requests NONE   Final   Culture  Setup Time     Final   Value: 11/14/2013 12:35     Performed at [facility] Count     Final   Value: NO GROWTH     Performed at [facility]   Culture     Final   Value: NO GROWTH     Performed at [facility]   Report Status 11/15/2013 FINAL   Final    Radiology Reports Ct Chest Wo Contrast  11/13/2013   CLINICAL DATA:  Evacuation for infiltrates  EXAM: CT CHEST WITHOUT CONTRAST  TECHNIQUE: Multidetector CT imaging of the chest was performed following the standard protocol without IV contrast.  COMPARISON:  Prior chest x-ray 11/13/2013 CT; prior CT abdomen/ pelvis including the lung bases 07/11/2010  FINDINGS: Mediastinum: Heterogeneous soft tissue density in the region of the left thoracic inlet is incompletely evaluated but favored to represent not judged nodular extension of thyroid goiter into the superior mediastinum. The maximal transverse dimensions are 4.1 x 3.0 cm. No definite mediastinal adenopathy although evaluation is limited in the absence of intravenous contrast.  Unremarkable thoracic esophagus.  Heart/Vascular: Large loculated pericardial effusion. The preponderance of the fluid volume is posterior and inferior to the left ventricle. Left subclavian approach cardiac rhythm maintenance device. Leads project  over the right atrium and in the right ventricular apex. Cardiomegaly. Atherosclerotic calcifications throughout the aorta and coronary arteries. The main pulmonary artery is enlarged at 3.1 cm in diameter suggesting underlying pulmonary arterial hypertension. Overall evaluation of vascular structures is limited in the absence of intravenous contrast.  Lungs/Pleura: Background of centrilobular emphysema. Diffuse interstitial prominence throughout the lungs most consistent with interstitial edema. Small bilateral layering pleural effusions with associated passive atelectasis. There are a few areas of more patchy airspace opacity in the right lower lobe which could reflect early bronchopneumonia.  Bones/Soft Tissues: No acute fracture or aggressive appearing lytic or blastic osseous lesion. Diastases recti noted in the upper abdomen. 1 cm low-attenuation superficial cystic structure in the subcutaneous fat immediately underlying the skin anterior to the sternum likely reflects a sebaceous cyst.  Upper Abdomen: Aortic atherosclerotic calcifications. Otherwise, unremarkable.  IMPRESSION: 1. Interstitial pulmonary edema and small bilateral pleural effusions are most consistent with mild -moderate CHF versus volume overload. 2. Foci of patchy ground-glass attenuation opacity in a peribronchovascular distribution in the right lower lobe may reflect early bronchopneumonia or areas of developing alveolar edema. 3. Large, loculated pericardial effusion. The majority of the fluid volume is posterior and inferior to the left ventricle. 4. Cardiomegaly. 5. Diffuse atherosclerosis including coronary artery disease. 6. Incompletely imaged and somewhat a amorphous soft tissue density in  the left aspect of the thoracic inlet extending into the superior mediastinum is favored to reflect mediastinal extension of a thyroid goiter. Consider dedicated thyroid ultrasound for further evaluation. 7. Enlarged main pulmonary artery as can be seen in the setting of pulmonary arterial hypertension.   Electronically Signed   By: Malachy Moan M.D.   On: 11/13/2013 16:40   Dg Chest Port 1 View  11/14/2013   CLINICAL DATA:  Effusion.  EXAM: PORTABLE CHEST - 1 VIEW  COMPARISON:  Chest CT and radiograph 11/13/2013  FINDINGS: Marked enlargement of the cardiac silhouette is unchanged from the prior radiograph, corresponding to a large pericardial effusion on CT. Left-sided dual lead pacemaker remains in place. Thoracic aortic calcification is noted. Pulmonary vascular congestion with mild interstitial pulmonary edema does not appear significantly changed. There is minimally improved aeration of the right lung base. Dense left basilar opacity process, likely atelectasis. Small pleural effusions on recent CT are not well demonstrated on this radiograph. No pneumothorax is seen.  IMPRESSION: 1. Unchanged enlargement of the cardiac silhouette. 2. Unchanged, mild interstitial edema.  Left basilar atelectasis.   Electronically Signed   By: Sebastian Ache   On: 11/14/2013 07:30   Dg Chest Port 1 View  11/13/2013   CLINICAL DATA:  Pleural effusion.  EXAM: PORTABLE CHEST - 1 VIEW  COMPARISON:  11/12/2013.  FINDINGS: Mediastinum and hilar structures normal. Severe cardiomegaly. Cardiac pacer lead tips in right atrium and right ventricle. Interim partial resolution of pulmonary edema. Dense infiltrate versus atelectasis left lung base. No pleural effusion.  IMPRESSION: 1. Severe stable cardiomegaly.  Partial clearing of pulmonary edema. 2. Dense infiltrate and or atelectasis left lung base. Associated left pleural effusion cannot be excluded.   Electronically Signed   By: Maisie Fus  Register   On: 11/13/2013 08:25   Dg  Chest Port 1 View  11/12/2013   CLINICAL DATA:  Status post thoracentesis  EXAM: PORTABLE CHEST - 1 VIEW  COMPARISON:  Study obtained earlier in the day  FINDINGS: There is moderate effusion on the left, less than on study obtained earlier in the day. No pneumothorax. There is cardiomegaly  with pulmonary venous hypertension and moderate generalized interstitial edema. There is consolidation in the left lower lobe, stable. There is extensive atherosclerotic change in the aorta. Pacemaker leads are attached to the right atrium and right ventricle. There is a stent in the left carotid region.  IMPRESSION: Persistent congestive heart failure. Persistent left lower lobe consolidation. Left effusion smaller post thoracentesis. No pneumothorax. Extensive atherosclerotic change.   Electronically Signed   By: Bretta BangWilliam  Woodruff M.D.   On: 11/12/2013 16:31   Dg Chest Portable 1 View  11/12/2013   CLINICAL DATA:  Shortness of breath and decreased oxygen saturation; history of CVA and coronary artery disease and long-term smoking history  EXAM: PORTABLE CHEST - 1 VIEW  COMPARISON:  PA and lateral chest of November 03, 2010  FINDINGS: The cardiopericardial silhouette is enlarged. The pulmonary vascularity is engorged and indistinct there is right infrahilar atelectasis or pneumonia. There is obscuration of much of the left hemi- thorax secondary to the enlarged cardiac silhouette and likely pleural fluid. A trace of pleural fluid on the right may be present. The permanent pacemaker is in appropriate position. The bony thorax is unremarkable.  IMPRESSION: Congestive heart failure with pulmonary interstitial edema. A moderate to large left pleural effusion is suspected. Right infrahilar atelectasis or pneumonia is suspected.   Electronically Signed   By: David  Swazil[place]   On: 11/12/2013 11:16    CBC  Recent Labs Lab 11/12/13 1114 11/13/13 0220 11/16/13 0444  WBC 11.8* 5.2 11.7*  HGB 9.4* 9.1* 10.2*  HCT 30.2* 28.6* 32.2*    PLT 303 316 321  MCV 90.4 87.7 87.3  MCH 28.1 27.9 27.6  MCHC 31.1 31.8 31.7  RDW 14.9 14.9 15.1  LYMPHSABS 0.8  --  2.5  MONOABS 0.4  --  0.6  EOSABS 0.0  --  0.2  BASOSABS 0.0  --  0.0    Chemistries   Recent Labs Lab 11/12/13 1114 11/13/13 0220 11/13/13 1325 11/16/13 0444  NA 147 148* 148* 145  K 4.3 4.8 4.2 3.2*  CL 113* 110 112 107  CO2 20 21 23 21   GLUCOSE 95 127* 79 80  BUN 26* 26* 26* 13  CREATININE 0.68 0.69 0.71 0.72  CALCIUM 8.7 9.2 9.0 8.7  AST  --   --  17  --   ALT  --   --  15  --   ALKPHOS  --   --  74  --   BILITOT  --   --  0.2*  --    ------------------------------------------------------------------------------------------------------------------ estimated creatinine clearance is 52.1 ml/min (by C-G formula based on Cr of 0.72). ------------------------------------------------------------------------------------------------------------------ No results found for this basename: HGBA1C,  in the last 72 hours ------------------------------------------------------------------------------------------------------------------ No results found for this basename: CHOL, HDL, LDLCALC, TRIG, CHOLHDL, LDLDIRECT,  in the last 72 hours ------------------------------------------------------------------------------------------------------------------ No results found for this basename: TSH, T4TOTAL, FREET3, T3FREE, THYROIDAB,  in the last 72 hours ------------------------------------------------------------------------------------------------------------------ No results found for this basename: VITAMINB12, FOLATE, FERRITIN, TIBC, IRON, RETICCTPCT,  in the last 72 hours  Coagulation profile  Recent Labs Lab 11/12/13 1114  INR 1.07    No results found for this basename: DDIMER,  in the last 72 hours  Cardiac Enzymes  Recent Labs Lab 11/12/13 1615 11/12/13 1845 11/13/13 0220  TROPONINI <0.30 <0.30 <0.30    ------------------------------------------------------------------------------------------------------------------ No components found with this basename: POCBNP,      Time Spent in minutes   35   SINGH,PRASHANT K M.D on 11/16/2013 at 9:52 AM  Between 7am to 7pm - Pager - [phone]  After 7pm go to [email or web address] - password [email or web address]  And look for the night coverage person covering for me after hours  [facility] Office  [phone]   **Disclaimer: This note may have been dictated with voice recognition software. Similar sounding words can inadvertently be transcribed and this note may contain transcription errors which may not have been corrected upon publication of note.**

## 2013-11-17 LAB — POTASSIUM: POTASSIUM: 3.5 meq/L — AB (ref 3.7–5.3)

## 2013-11-17 LAB — GLUCOSE, CAPILLARY
Glucose-Capillary: 86 mg/dL (ref 70–99)
Glucose-Capillary: 100 mg/dL — ABNORMAL HIGH (ref 70–99)

## 2013-11-17 LAB — MAGNESIUM: MAGNESIUM: 2 mg/dL (ref 1.5–2.5)

## 2013-11-17 MED ORDER — POTASSIUM CHLORIDE ER 20 MEQ PO TBCR: 20.0000 meq | EXTENDED_RELEASE_TABLET | Freq: Every day | ORAL | Status: AC

## 2013-11-17 MED ORDER — RISAQUAD PO CAPS: 2.0000 | ORAL_CAPSULE | Freq: Every day | ORAL | Status: AC

## 2013-11-17 MED ORDER — POTASSIUM CHLORIDE 20 MEQ/15ML (10%) PO LIQD
40.0000 meq | Freq: Two times a day (BID) | ORAL | Status: DC
  Administered 2013-11-17: 40 meq via ORAL
  Filled 2013-11-17 (×2): qty 30

## 2013-11-17 MED ORDER — FUROSEMIDE 20 MG PO TABS: 20.0000 mg | ORAL_TABLET | Freq: Every day | ORAL | Status: DC

## 2013-11-17 MED ORDER — AMLODIPINE BESYLATE 10 MG PO TABS: 10.0000 mg | ORAL_TABLET | Freq: Every day | ORAL | Status: AC

## 2013-11-17 MED ORDER — ENSURE COMPLETE PO LIQD: 237.0000 mL | Freq: Two times a day (BID) | ORAL | Status: DC

## 2013-11-17 MED ORDER — LOPERAMIDE HCL 2 MG PO CAPS
2.0000 mg | ORAL_CAPSULE | Freq: Four times a day (QID) | ORAL | Status: DC | PRN
Start: 2013-11-17 — End: 2013-11-17

## 2013-11-17 MED ORDER — LOPERAMIDE HCL 2 MG PO CAPS: 2.0000 mg | ORAL_CAPSULE | Freq: Four times a day (QID) | ORAL | Status: AC | PRN

## 2013-11-17 MED ORDER — AMOXICILLIN-POT CLAVULANATE 875-125 MG PO TABS: 1.0000 | ORAL_TABLET | Freq: Two times a day (BID) | ORAL | Status: DC

## 2013-11-17 MED ORDER — OXYCODONE-ACETAMINOPHEN 5-325 MG PO TABS: 1.0000 | ORAL_TABLET | Freq: Three times a day (TID) | ORAL | Status: DC | PRN

## 2013-11-17 MED ORDER — RISAQUAD PO CAPS
2.0000 | ORAL_CAPSULE | Freq: Every day | ORAL | Status: DC
  Administered 2013-11-17: 2 via ORAL
  Filled 2013-11-17: qty 2

## 2013-11-17 NOTE — Progress Notes (Signed)
CSW (Clinical Social Worker) prepared pt dc packet and placed with shadow chart. CSW arranged non-emergent ambulance transport. Pt family, pt nurse, and facility informed. CSW signing off.  Dashia Caldeira, LCSWA [phone]  

## 2013-11-17 NOTE — Discharge Summary (Addendum)
[name], is a 68 y.o. female  DOB 09/18/1945  MRN [id number].  Admission date:  11/12/2013  Admitting Physician  Lupita Leash, MD  Discharge Date:  11/17/2013   Primary MD  Georgann Housekeeper, MD  Recommendations for primary care physician for things to follow:   Check CBC, CMP, magnesium and a 2 view chest x-ray in a week. Thyroid ultrasound in one week. Outpatient pulmonary and ENT followup.   Admission Diagnosis  Pleural effusion [511.9] Hypoxia [799.02]   Discharge Diagnosis  Pleural effusion [511.9] Hypoxia [799.02]    Active Problems:   DIABETES MELLITUS, TYPE II   NEPHROPATHY, DIABETIC   Acute respiratory failure   GIB (gastrointestinal bleeding)   Parapneumonic effusion   Dementia   CHF, acute on chronic      Past Medical History  Diagnosis Date  . Diabetes mellitus   . Hypertension   . Stroke   . Hyperlipidemia   . Anemia   . CAD (coronary artery disease)   . Arthritis   . Ulcer   . Hypernatremia   . Hypokalemia   . Carotid artery occlusion     Past Surgical History  Procedure Laterality Date  . Above knee leg amputation      RIGHT AKA  . Carotid endarterectomy    . Peripheral arterial stent graft  03/16/2002    PTA & stenting of the left iliac artery  . Cardiac catheterization  04/10/2002    Normal  . Carotid artery angioplasty  02/22/2009    PTA & stenting Choice Protocol  . Permanent pacemaker insertion  08/08/2010    [facility]  . Nm myocar perf wall motion  04/07/2009    normal       History of present illness and  Hospital Course:     Kindly see H&P for history of present illness and admission details, please review complete Labs, Consult reports and Test reports for all details in brief -   HPI This is a 68 year old female who resides at a SNF. Has multiple co-morbids:  baseline dementia, Diastolic dysfxn, PVD, DM and right AKA. Admitted on 7/30 after awakening SOB. O2 sats in 60s on 2 liters. On arrival she was found to have a BNP of 7176, large left effusion and pulmonary edema. PCCM was asked to see for acute respiratory failure. She was under the care of pulmonary critical care underwent thoracentesis fluid was suggestive of parapneumonic exudate, she likely had recurrent aspiration HCAP, was treated with IV antibiotics stabilized and transferred to hospitalist service on 11/16/2013.    Hospital Course    1. Acute respiratory failure due to aspiration at CAP with left-sided parapneumonic effusion along with mild acute on chronic diastolic CHF EF 50-55%. Status post left-sided thoracentesis and antibiotic treatment under pulmonary critical care for the last few days, now transferred to hospitalist service on 11/16/2013. Afebrile with stable blood work, transitioned to oral Augmentin for another 5 days from today, it is for recurrent aspiration currently on dysphagia 1 diet will need  continued speech followup, o2 and nebulizer treatment as needed. We'll place him on low-dose Lasix as well.     2. Underlying dementia. Poor historian, at risk for delirium and aspiration, supportive care with aspiration precautions. Avoid benzodiazepine.      3. Severe PAD requiring right AKA. Continue supportive care, on statin for secondary prevention. He be could consider aspirin depending on chronic GI issues and condition.     4. Dyslipidemia on statin continue.      5. Acute on chronic diastolic CHF EF 50-55%. Good status compensated, and low-dose Lasix and potassium upon discharge     6. DM 2 on diet control with diabetic peripheral neuropathy. Low carb diet, monitor A1c in glycemic control in the outpatient setting. On Lyrica for peripheral neuropathy.      7. Hypertension stable on beta blocker hydralazine which will be continued. Norvasc added for  better control.      8. Low potassium. Replaced, and he recheck CMP and magnesium levels in 5-7 days.     9. Incidental finding of thyroid enlargement on CT scan.She needs a dedicated thyroid ultrasound in one week. Outpatient ENT followup.      Discharge Condition: stable   Follow UP  Follow-up Information   Follow up with HUSAIN,KARRAR, MD. Schedule an appointment as soon as possible for a visit in 1 week.   Specialty:  Internal Medicine   Contact information:   [address], [address] [place] [address] [phone]       Follow up with Oretha MilchALVA,RAKESH V., MD. Schedule an appointment as soon as possible for a visit in 1 week.   Specialty:  Pulmonary Disease   Contact information:   [address] [place] [address] [phone]       Follow up with Dillard CannonNEWMAN, CHRISTOPHER, MD. Schedule an appointment as soon as possible for a visit in 1 week. (Large goiter)    Specialty:  Otolaryngology   Contact information:   [address] [place] [address] [phone]         Discharge Instructions  and  Discharge Medications          Discharge Instructions   Discharge instructions    Complete by:  As directed   Follow with Primary MD Georgann HousekeeperHUSAIN,KARRAR, MD in 7 days   Get CBC, CMP, 2 view Chest X ray checked  by Primary or SNF MD next visit.    Activity: As tolerated with Full fall precautions use walker/cane & assistance as needed   Disposition SNF   Diet: Dysphagia 1 diet with thin liquids, full feeding assistance and aspiration precautions.  For Heart failure patients - Check your Weight same time everyday, if you gain over 2 pounds, or you develop in leg swelling, experience more shortness of breath or chest pain, call your Primary MD immediately. Follow Cardiac Low Salt Diet and 1.8 lit/day fluid restriction.   On your next visit with her primary care physician please Get Medicines reviewed and adjusted.  Please request your Prim.MD  to go over all Hospital Tests and Procedure/Radiological results at the follow up, please get all Hospital records sent to your Prim MD by signing hospital release before you go home.   If you experience worsening of your admission symptoms, develop shortness of breath, life threatening emergency, suicidal or homicidal thoughts you must seek medical attention immediately by calling 911 or calling your MD immediately  if symptoms less severe.  You Must read complete instructions/literature along with all  the possible adverse reactions/side effects for all the Medicines you take and that have been prescribed to you. Take any new Medicines after you have completely understood and accpet all the possible adverse reactions/side effects.   Do not drive, operating heavy machinery, perform activities at heights, swimming or participation in water activities or provide baby sitting services if your were admitted for syncope or siezures until you have seen by Primary MD or a Neurologist and advised to do so again.  Do not drive when taking Pain medications.    Do not take more than prescribed Pain, Sleep and Anxiety Medications  Special Instructions: If you have smoked or chewed Tobacco  in the last 2 yrs please stop smoking, stop any regular Alcohol  and or any Recreational drug use.  Wear Seat belts while driving.   Please note  You were cared for by a hospitalist during your hospital stay. If you have any questions about your discharge medications or the care you received while you were in the hospital after you are discharged, you can call the unit and asked to speak with the hospitalist on call if the hospitalist that took care of you is not available. Once you are discharged, your primary care physician will handle any further medical issues. Please note that NO REFILLS for any discharge medications will be authorized once you are discharged, as it is imperative that you return to your primary care  physician (or establish a relationship with a primary care physician if you do not have one) for your aftercare needs so that they can reassess your need for medications and monitor your lab values.     Increase activity slowly    Complete by:  As directed             Medication List    STOP taking these medications       MIRALAX powder  Generic drug:  polyethylene glycol powder      TAKE these medications       acetaminophen 325 MG tablet  Commonly known as:  TYLENOL  Take 650 mg by mouth every 4 (four) hours as needed (for pain).     acidophilus Caps capsule  Take 2 capsules by mouth daily.     albuterol (2.5 MG/3ML) 0.083% nebulizer solution  Commonly known as:  PROVENTIL  Take 2.5 mg by nebulization 2 (two) times daily.     amLODipine 10 MG tablet  Commonly known as:  NORVASC  Take 1 tablet (10 mg total) by mouth daily.     amoxicillin-clavulanate 875-125 MG per tablet  Commonly known as:  AUGMENTIN  Take 1 tablet by mouth every 12 (twelve) hours. 5 more days     chlorhexidine 0.12 % solution  Commonly known as:  PERIDEX  Use as directed 10 mLs in the mouth or throat 4 (four) times daily.     feeding supplement (ENSURE COMPLETE) Liqd  Take 237 mLs by mouth 2 (two) times daily between meals.     ferrous sulfate 220 (44 FE) MG/5ML solution  Take 330 mg by mouth daily. Mix with juice     furosemide 20 MG tablet  Commonly known as:  LASIX  Take 1 tablet (20 mg total) by mouth daily.     hydrALAZINE 50 MG tablet  Commonly known as:  APRESOLINE  Take 50 mg by mouth 4 (four) times daily.     insulin lispro 100 UNIT/ML injection  Commonly known as:  HUMALOG  Inject 0-9 Units  into the skin 3 (three) times daily with meals. Per SS: 60-10= 0 units, 101-151=1 unit, 151-200=2 units, 202-250=3 units 251-300=5 units, 301-350=7 units and >350=7 units     labetalol 300 MG tablet  Commonly known as:  NORMODYNE  Take 150 mg by mouth every 8 (eight) hours.     LIDODERM  5 %  Generic drug:  lidocaine  Place 1 patch onto the skin daily. Remove & Discard patch within 12 hours or as directed by MD     loperamide 2 MG capsule  Commonly known as:  IMODIUM  Take 1 capsule (2 mg total) by mouth every 6 (six) hours as needed for diarrhea or loose stools.     multivitamin tablet  Take 1 tablet by mouth daily.     OCUSOFT LID SCRUB Pads  Place 1 each into both eyes every morning.     omeprazole 20 MG capsule  Commonly known as:  PRILOSEC  Take 20 mg by mouth daily.     oxyCODONE-acetaminophen 5-325 MG per tablet  Commonly known as:  PERCOCET/ROXICET  Take 1 tablet by mouth every 8 (eight) hours as needed for moderate pain or severe pain.     Potassium Chloride ER 20 MEQ Tbcr  Take 20 mEq by mouth daily.     pravastatin 80 MG tablet  Commonly known as:  PRAVACHOL  Take 80 mg by mouth at bedtime.     pregabalin 50 MG capsule  Commonly known as:  LYRICA  Take 50 mg by mouth 3 (three) times daily.     sennosides-docusate sodium 8.6-50 MG tablet  Commonly known as:  SENOKOT-S  Take 2 tablets by mouth 2 (two) times daily.     vitamin C 500 MG tablet  Commonly known as:  ASCORBIC ACID  Take 500 mg by mouth 3 (three) times daily. Give with ferrous sulfate          Diet and Activity recommendation: See Discharge Instructions above   Consults obtained - PCCM   Major procedures and Radiology Reports - PLEASE review detailed and final reports for all details, in brief -       Ct Chest Wo Contrast  11/13/2013   CLINICAL DATA:  Evacuation for infiltrates  EXAM: CT CHEST WITHOUT CONTRAST  TECHNIQUE: Multidetector CT imaging of the chest was performed following the standard protocol without IV contrast.  COMPARISON:  Prior chest x-ray 11/13/2013 CT; prior CT abdomen/ pelvis including the lung bases 07/11/2010  FINDINGS: Mediastinum: Heterogeneous soft tissue density in the region of the left thoracic inlet is incompletely evaluated but favored to  represent not judged nodular extension of thyroid goiter into the superior mediastinum. The maximal transverse dimensions are 4.1 x 3.0 cm. No definite mediastinal adenopathy although evaluation is limited in the absence of intravenous contrast. Unremarkable thoracic esophagus.  Heart/Vascular: Large loculated pericardial effusion. The preponderance of the fluid volume is posterior and inferior to the left ventricle. Left subclavian approach cardiac rhythm maintenance device. Leads project over the right atrium and in the right ventricular apex. Cardiomegaly. Atherosclerotic calcifications throughout the aorta and coronary arteries. The main pulmonary artery is enlarged at 3.1 cm in diameter suggesting underlying pulmonary arterial hypertension. Overall evaluation of vascular structures is limited in the absence of intravenous contrast.  Lungs/Pleura: Background of centrilobular emphysema. Diffuse interstitial prominence throughout the lungs most consistent with interstitial edema. Small bilateral layering pleural effusions with associated passive atelectasis. There are a few areas of more patchy airspace opacity in the right lower  lobe which could reflect early bronchopneumonia.  Bones/Soft Tissues: No acute fracture or aggressive appearing lytic or blastic osseous lesion. Diastases recti noted in the upper abdomen. 1 cm low-attenuation superficial cystic structure in the subcutaneous fat immediately underlying the skin anterior to the sternum likely reflects a sebaceous cyst.  Upper Abdomen: Aortic atherosclerotic calcifications. Otherwise, unremarkable.  IMPRESSION: 1. Interstitial pulmonary edema and small bilateral pleural effusions are most consistent with mild -moderate CHF versus volume overload. 2. Foci of patchy ground-glass attenuation opacity in a peribronchovascular distribution in the right lower lobe may reflect early bronchopneumonia or areas of developing alveolar edema. 3. Large, loculated  pericardial effusion. The majority of the fluid volume is posterior and inferior to the left ventricle. 4. Cardiomegaly. 5. Diffuse atherosclerosis including coronary artery disease. 6. Incompletely imaged and somewhat a amorphous soft tissue density in the left aspect of the thoracic inlet extending into the superior mediastinum is favored to reflect mediastinal extension of a thyroid goiter. Consider dedicated thyroid ultrasound for further evaluation. 7. Enlarged main pulmonary artery as can be seen in the setting of pulmonary arterial hypertension.   Electronically Signed   By: Malachy Moan M.D.   On: 11/13/2013 16:40   Dg Chest Port 1 View  11/14/2013   CLINICAL DATA:  Effusion.  EXAM: PORTABLE CHEST - 1 VIEW  COMPARISON:  Chest CT and radiograph 11/13/2013  FINDINGS: Marked enlargement of the cardiac silhouette is unchanged from the prior radiograph, corresponding to a large pericardial effusion on CT. Left-sided dual lead pacemaker remains in place. Thoracic aortic calcification is noted. Pulmonary vascular congestion with mild interstitial pulmonary edema does not appear significantly changed. There is minimally improved aeration of the right lung base. Dense left basilar opacity process, likely atelectasis. Small pleural effusions on recent CT are not well demonstrated on this radiograph. No pneumothorax is seen.  IMPRESSION: 1. Unchanged enlargement of the cardiac silhouette. 2. Unchanged, mild interstitial edema.  Left basilar atelectasis.   Electronically Signed   By: Sebastian Ache   On: 11/14/2013 07:30         Micro Results      Recent Results (from the past 240 hour(s))  MRSA PCR SCREENING     Status: Abnormal   Collection Time    11/12/13  3:45 PM      Result Value Ref Range Status   MRSA by PCR POSITIVE (*) NEGATIVE Final   Comment:            The GeneXpert MRSA Assay (FDA     approved for NASAL specimens     only), is one component of a     comprehensive MRSA colonization       surveillance program. It is not     intended to diagnose MRSA     infection nor to guide or     monitor treatment for     MRSA infections.     RESULT CALLED TO, READ BACK BY AND VERIFIED WITH:     M.HOPPER,RN AT 2058 BY L.PITT 11/12/13  AFB CULTURE WITH SMEAR     Status: None   Collection Time    11/12/13  3:45 PM      Result Value Ref Range Status   Specimen Description PLEURAL FLUID LEFT   Final   Special Requests 6.0ML   Final   Acid Fast Smear     Final   Value: NO ACID FAST BACILLI SEEN     Performed at [facility]  Final   Value: CULTURE WILL BE EXAMINED FOR 6 WEEKS BEFORE ISSUING A FINAL REPORT     Performed at [facility]   Report Status PENDING   Incomplete  BODY FLUID CULTURE     Status: None   Collection Time    11/12/13  3:45 PM      Result Value Ref Range Status   Specimen Description PLEURAL FLUID LEFT   Final   Special Requests 6.0ML FLUID   Final   Gram Stain     Final   Value: FEW WBC PRESENT,BOTH PMN AND MONONUCLEAR     NO ORGANISMS SEEN     Performed at [facility]   Culture     Final   Value: NO GROWTH 3 DAYS     Performed at [facility]   Report Status 11/16/2013 FINAL   Final  FUNGUS CULTURE W SMEAR     Status: None   Collection Time    11/12/13  3:45 PM      Result Value Ref Range Status   Specimen Description PLEURAL FLUID LEFT   Final   Special Requests 6.0ML   Final   Fungal Smear     Final   Value: NO YEAST OR FUNGAL ELEMENTS SEEN     Performed at [facility]   Culture     Final   Value: CULTURE IN PROGRESS FOR FOUR WEEKS     Performed at [facility]   Report Status PENDING   Incomplete  CULTURE, BLOOD (ROUTINE X 2)     Status: None   Collection Time    11/12/13  3:55 PM      Result Value Ref Range Status   Specimen Description BLOOD RIGHT ARM   Final   Special Requests BOTTLES DRAWN AEROBIC AND ANAEROBIC 5CC   Final   Culture  Setup Time     Final   Value:  11/13/2013 00:28     Performed at [facility]   Culture     Final   Value:        BLOOD CULTURE RECEIVED NO GROWTH TO DATE CULTURE WILL BE HELD FOR 5 DAYS BEFORE ISSUING A FINAL NEGATIVE REPORT     Performed at [facility]   Report Status PENDING   Incomplete  CULTURE, BLOOD (ROUTINE X 2)     Status: None   Collection Time    11/12/13  4:15 PM      Result Value Ref Range Status   Specimen Description BLOOD RIGHT HAND   Final   Special Requests BOTTLES DRAWN AEROBIC AND ANAEROBIC 5CC   Final   Culture  Setup Time     Final   Value: 11/13/2013 00:27     Performed at [facility]   Culture     Final   Value:        BLOOD CULTURE RECEIVED NO GROWTH TO DATE CULTURE WILL BE HELD FOR 5 DAYS BEFORE ISSUING A FINAL NEGATIVE REPORT     Performed at [facility]   Report Status PENDING   Incomplete  URINE CULTURE     Status: None   Collection Time    11/14/13  3:40 AM      Result Value Ref Range Status   Specimen Description URINE, CATHETERIZED   Final   Special Requests NONE   Final   Culture  Setup Time     Final   Value: 11/14/2013 12:35  Performed at [facility] Count     Final   Value: NO GROWTH     Performed at [facility]   Culture     Final   Value: NO GROWTH     Performed at [facility]   Report Status 11/15/2013 FINAL   Final  CLOSTRIDIUM DIFFICILE BY PCR     Status: None   Collection Time    11/16/13  6:00 PM      Result Value Ref Range Status   C difficile by pcr NEGATIVE  NEGATIVE Final       Today   Subjective:   Chyanne Bourgoin today has no headache,no chest abdominal pain,no new weakness tingling or numbness, feels much better.  Objective:   Blood pressure 147/42, pulse 59, temperature 98.8 F (37.1 C), temperature source Oral, resp. rate 18, height 4\' 11"  (1.499 m), weight 60 kg (132 lb 4.4 oz), SpO2 95.00%.   Intake/Output Summary (Last 24 hours) at 11/17/13 1006 Last data filed at  11/17/13 0830  Gross per 24 hour  Intake    315 ml  Output      0 ml  Net    315 ml    Exam Awake Alert, No new F.N deficits, Normal affect .AT,PERRAL Supple Neck,No JVD, No cervical lymphadenopathy appriciated.  Symmetrical Chest wall movement, Good air movement bilaterally, CTAB RRR,No Gallops,Rubs or new Murmurs, No Parasternal Heave +ve B.Sounds, Abd Soft, Non tender, No organomegaly appriciated, No rebound -guarding or rigidity. No Cyanosis, Clubbing or edema, No new Rash or bruise, old right AKA, left leg and ankle splint/boot  Data Review   CBC w Diff: Lab Results  Component Value Date   WBC 11.7* 11/16/2013   HGB 10.2* 11/16/2013   HCT 32.2* 11/16/2013   PLT 321 11/16/2013   LYMPHOPCT 21 11/16/2013   MONOPCT 5 11/16/2013   EOSPCT 2 11/16/2013   BASOPCT 0 11/16/2013    CMP: Lab Results  Component Value Date   NA 145 11/16/2013   K 3.5* 11/17/2013   CL 107 11/16/2013   CO2 21 11/16/2013   BUN 13 11/16/2013   CREATININE 0.72 11/16/2013   PROT 6.7 11/13/2013   ALBUMIN 3.0* 11/13/2013   BILITOT 0.2* 11/13/2013   ALKPHOS 74 11/13/2013   AST 17 11/13/2013   ALT 15 11/13/2013  .   Total Time in preparing paper work, data evaluation and todays exam - 35 minutes  Leroy Sea M.D on 11/17/2013 at 10:06 AM  [facility] Office  [phone]   **Disclaimer: This note may have been dictated with voice recognition software. Similar sounding words can inadvertently be transcribed and this note may contain transcription errors which may not have been corrected upon publication of note.**

## 2013-11-17 NOTE — Care Management Note (Signed)
    Page 1 of 1   11/17/2013     4:52:27 PM CARE MANAGEMENT NOTE 11/17/2013  Patient:  [name],[name]   Account Number:  [id number]  Date Initiated:  11/13/2013  Documentation initiated by:  [facility]  Subjective/Objective Assessment:   Admitted with SOB -     Action/Plan:   Anticipated DC Date:  11/17/2013   Anticipated DC Plan:  SKILLED NURSING FACILITY  In-house referral  Clinical Social Worker      DC Planning Services  CM consult      Choice offered to / List presented to:             Status of service:  In process, will continue to follow Medicare Important Message given?  YES (If response is "NO", the following Medicare IM given date fields will be blank) Date Medicare IM given:  11/16/2013 Medicare IM given by:  Abubakr Wieman Date Additional Medicare IM given:   Additional Medicare IM given by:    Discharge Disposition:  SKILLED NURSING FACILITY  Per UR Regulation:  Reviewed for med. necessity/level of care/duration of stay  If discussed at Long Length of Stay Meetings, dates discussed:    Comments:  ContactBerdine Addison:  Dalziel,Linda Daughter   [phone]                 Denise,[place]quigg Grandaughter [phone]                Nicko,Bryant Other [phone]  11/17/13 Sidney AceJulie Alon Mazor, RN, BSN [phone] Pt discharged to SNF today,per CSW arrangements.  11-13-13 10:45am Avie ArenasSarah Brown, RNBSN [phone]- [phone] From SNF - SW referral placed.

## 2013-11-17 NOTE — Progress Notes (Addendum)
[facility] All questions [facility]. Discharge packet sent with PT[facility].  Pt had stool when being moved over onto stretcher by PTAR. Nursing staff tried to clean pt up, PTAR refused.

## 2013-11-17 NOTE — Progress Notes (Signed)
Clinical Social Work Department BRIEF PSYCHOSOCIAL ASSESSMENT 11/17/2013  Patient:  [name],[name]     Account Number:  [id number]     Admit date:  11/12/2013  Clinical Social Worker:  Harless NakayamaAMBELAL,[place] Jaquith, LCSWA  Date/Time:  11/17/2013 10:30 AM  Referred by:  Physician  Date Referred:  11/17/2013 Referred for  SNF Placement   Other Referral:   Interview type:  Family Other interview type:   Spoke with pt daughter [name] over the phone    PSYCHOSOCIAL DATA Living Status:  FACILITY [facility] Level of care:  Skilled Nursing Facility Primary support name:  Maureen AddisonLinda [name] [phone] Primary support relationship to patient:  CHILD, ADULT Degree of support available:   Pt has good family support    CURRENT CONCERNS Current Concerns  Post-Acute Placement   Other Concerns:    SOCIAL WORK ASSESSMENT / PLAN CSW made aware that pt was admitted from facility and is ready to dc today. CSW called pt daughter [name] and confirmed [facility]. [name] informed CSW that pt has been at facility for3 years and plan will be for pt to return. Pt daughter had no questions or concerns at this time and expressed she has been pleased with facility so far. CSW called facility to notify of pt return today. CSW was informed that pt daughter will need to speak with their billing office prior to facility confirming they can accept pt back. CSW called [name] again to notify and asked her to please call facility as soon as possible. CSW asked for permission to fax pt out to other facilities incase [facility] cannot accept pt back. Pt daughter asked for CSW to await her return call back before doing this.   Assessment/plan status:  Psychosocial Support/Ongoing Assessment of Needs Other assessment/ plan:   Information/referral to community resources:   SNF list to be provided if needed    PATIENT'S/FAMILY'S RESPONSE TO PLAN OF CARE: Pt daughter  pleasant and cooperative. Pt daughter agreeable to pt returning to SNF.       [name], LCSWA [phone]

## 2013-11-17 NOTE — Discharge Instructions (Signed)
Follow with Primary MD Georgann HousekeeperHUSAIN,KARRAR, MD in 7 days   Get CBC, CMP, 2 view Chest X ray checked  by Primary or SNF MD next visit.    Activity: As tolerated with Full fall precautions use walker/cane & assistance as needed   Disposition SNF   Diet: Dysphagia 1 diet with thin liquids, full feeding assistance and aspiration precautions.  For Heart failure patients - Check your Weight same time everyday, if you gain over 2 pounds, or you develop in leg swelling, experience more shortness of breath or chest pain, call your Primary MD immediately. Follow Cardiac Low Salt Diet and 1.8 lit/day fluid restriction.   On your next visit with her primary care physician please Get Medicines reviewed and adjusted.  Please request your Prim.MD to go over all Hospital Tests and Procedure/Radiological results at the follow up, please get all Hospital records sent to your Prim MD by signing hospital release before you go home.   If you experience worsening of your admission symptoms, develop shortness of breath, life threatening emergency, suicidal or homicidal thoughts you must seek medical attention immediately by calling 911 or calling your MD immediately  if symptoms less severe.  You Must read complete instructions/literature along with all the possible adverse reactions/side effects for all the Medicines you take and that have been prescribed to you. Take any new Medicines after you have completely understood and accpet all the possible adverse reactions/side effects.   Do not drive, operating heavy machinery, perform activities at heights, swimming or participation in water activities or provide baby sitting services if your were admitted for syncope or siezures until you have seen by Primary MD or a Neurologist and advised to do so again.  Do not drive when taking Pain medications.    Do not take more than prescribed Pain, Sleep and Anxiety Medications  Special Instructions: If you have smoked or  chewed Tobacco  in the last 2 yrs please stop smoking, stop any regular Alcohol  and or any Recreational drug use.  Wear Seat belts while driving.   Please note  You were cared for by a hospitalist during your hospital stay. If you have any questions about your discharge medications or the care you received while you were in the hospital after you are discharged, you can call the unit and asked to speak with the hospitalist on call if the hospitalist that took care of you is not available. Once you are discharged, your primary care physician will handle any further medical issues. Please note that NO REFILLS for any discharge medications will be authorized once you are discharged, as it is imperative that you return to your primary care physician (or establish a relationship with a primary care physician if you do not have one) for your aftercare needs so that they can reassess your need for medications and monitor your lab values.

## 2013-11-19 ENCOUNTER — Telehealth: Payer: Self-pay | Admitting: Pulmonary Disease

## 2013-11-19 LAB — CULTURE, BLOOD (ROUTINE X 2)
CULTURE: NO GROWTH
CULTURE: NO GROWTH

## 2013-11-19 NOTE — Telephone Encounter (Signed)
Spoke to [place], she is working on this actively.  Will route to her.

## 2013-11-20 LAB — RHEUMATOID FACTORS, FLUID: CORTISOL #1 (BASE): NEGATIVE

## 2013-11-20 NOTE — Telephone Encounter (Signed)
Called spoke with pt's nurse Larita FifeLynn - HFU scheduled with TP for 8.13.15 at 1045.  Larita FifeLynn is aware pt should arrive 15-20 mins early for STAT cxr as pt was admitted for PNA.  Nothing further needed at this time; will sign off.

## 2013-11-26 ENCOUNTER — Other Ambulatory Visit: Payer: Self-pay | Admitting: Adult Health

## 2013-11-26 ENCOUNTER — Ambulatory Visit (INDEPENDENT_AMBULATORY_CARE_PROVIDER_SITE_OTHER)
Admission: RE | Admit: 2013-11-26 | Discharge: 2013-11-26 | Disposition: A | Discharge status: Home / Self Care | Payer: Medicare Other | Source: Ambulatory Visit | Attending: Adult Health | Admitting: Adult Health

## 2013-11-26 ENCOUNTER — Encounter: Payer: Self-pay | Admitting: Adult Health

## 2013-11-26 ENCOUNTER — Ambulatory Visit (INDEPENDENT_AMBULATORY_CARE_PROVIDER_SITE_OTHER): Payer: Medicare Other | Admitting: Adult Health

## 2013-11-26 VITALS — BP 118/60 | HR 68 | Temp 98.5°F

## 2013-11-26 DIAGNOSIS — J918 Pleural effusion in other conditions classified elsewhere: Principal | ICD-10-CM

## 2013-11-26 DIAGNOSIS — J90 Pleural effusion, not elsewhere classified: Secondary | ICD-10-CM

## 2013-11-26 DIAGNOSIS — J189 Pneumonia, unspecified organism: Secondary | ICD-10-CM

## 2013-11-26 MED ORDER — LEVOFLOXACIN 750 MG PO TABS: 750.0000 mg | ORAL_TABLET | Freq: Every day | ORAL | Status: DC

## 2013-11-26 NOTE — Patient Instructions (Signed)
Increase Lasix 40mg  daily for next 3 days .  Levaquin 750mg  daily for 5 days  Follow up Dr. Sherene SiresWert  In 1 week with chest xray .  Please contact office for sooner follow up if symptoms do not improve or worsen or seek emergency care

## 2013-11-27 ENCOUNTER — Telehealth: Payer: Self-pay | Admitting: Cardiovascular Disease

## 2013-11-27 NOTE — Telephone Encounter (Signed)
Closed encounter °

## 2013-11-29 NOTE — Progress Notes (Signed)
   Subjective:    Patient ID: [name], female    DOB: 12/10/1945, 68 y.o.   MRN: [id number]  HPI 68 year old female who resides at a SNF. Has multiple co-morbids: baseline dementia, Diastolic dysfxn, PVD, DM and right AKA. Admitted on 11/12/13 with resp distress w/ O2 sats in 60s on 2 liters w/ BNP of 7176, large left effusion and pulmonary edema. PCCM admitted . Found to have a HCAP with left sided parapenumonic effusion  , s/p thoracentesis with 700cc removed   11/26/13 Post Hospital follow up  Pt was admitted 7/30 with resp distress found to have large left effusion , edema w/ elevated bnp ~7000  She was tx for a recurrent aspiration HCAP , w/ IV ax . Underwent throacentesis felt to parapenumonic . Discharged on abx. W/ augmentin x 5 d. Had acute on chronic decompensated diastolic dysfunction improved with diuresis and discharged on lasix. Does report abx were extended by SNF MD for additional 5 .  Feels better since discharge. No increased cough or dyspnea. Here with family member.  CXR today is worse with increased opacity on left. Denies n/v or choking per family member.  No fever, n/v/d , chest pain, hemoptysis .     Review of Systems Constitutional:   No  weight loss, night sweats,  Fevers, chills,  +fatigue, or  lassitude.  HEENT:   No headaches,  Difficulty swallowing,  Tooth/dental problems, or  Sore throat,                No sneezing, itching, ear ache, nasal congestion, post nasal drip,   CV:  No chest pain,  Orthopnea, PND, , anasarca, dizziness, palpitations, syncope.   GI  No heartburn, indigestion, abdominal pain, nausea, vomiting, diarrhea, change in bowel habits, loss of appetite, bloody stools.   Resp:    No chest wall deformity  Skin: no rash or lesions.  GU: no dysuria, change in color of urine, no urgency or frequency.  No flank pain, no hematuria   MS:  No joint pain or swelling.  No decreased range of motion.  No back pain.  Psych:  No change in mood  or affect. No depression or anxiety.  + memory loss.         Objective:   Physical Exam GEN: A/Ox3; pleasant , NAD, elderly , frail   HEENT:  [place]/AT,  EACs-clear, TMs-wnl, NOSE-clear, THROAT-clear, no lesions, no postnasal drip or exudate noted.   NECK:  Supple w/ fair ROM; no JVD; normal carotid impulses w/o bruits; no thyromegaly or nodules palpated; no lymphadenopathy.  RESP  diminshed BS in bases no accessory muscle use, no dullness to percussion  CARD:  RRR, no m/r/g  , 1+ peripheral edema, pulses intact, no cyanosis or clubbing.  GI:   Soft & nt; nml bowel sounds; no organomegaly or masses detected.  Musco: Warm bil, s/p right AKA   Neuro: alert, confused     Skin: Warm, no lesions or rashes   CXR 8/13  Opacity on the left has increased, extending to the left mid  lung. This is likely combination pleural fluid with lung parenchyma  opacity, which may reflect pneumonia or other infiltrate,  atelectasis and/or asymmetric edema. There is stable interstitial  thickening which may reflect mild interstitial edema. No right  pleural effusion. No other change.       Assessment & Plan:

## 2013-11-29 NOTE — Assessment & Plan Note (Addendum)
Recurrent aspiration HCAP w/ parapenumonic effusion s/p throacentesis w/ 700cc removed  CXR shows worsening aeration  Will gently diuresis and restart IV abx w/ Levaquin  Close follow up with cxr on return in 1 week   Plan  Increase Lasix 40mg  daily for next 3 days .  Levaquin 750mg  daily for 5 days  Follow up Dr. Sherene SiresWert  In 1 week with chest xray .  Please contact office for sooner follow up if symptoms do not improve or worsen or seek emergency care

## 2013-11-30 ENCOUNTER — Encounter: Payer: Self-pay | Admitting: Cardiovascular Disease

## 2013-11-30 ENCOUNTER — Telehealth: Payer: Self-pay | Admitting: Pulmonary Disease

## 2013-11-30 NOTE — Progress Notes (Signed)
Chart and ov progress note/ cxr reviewed and agree with a/p

## 2013-11-30 NOTE — Telephone Encounter (Signed)
Per 11/26/13 OV  Pt takes levaquin 750 mg QD x 5 days.  Denise from [facility] calling to confirm this. Nothing further needed

## 2013-12-03 ENCOUNTER — Other Ambulatory Visit (INDEPENDENT_AMBULATORY_CARE_PROVIDER_SITE_OTHER): Payer: Medicare Other

## 2013-12-03 ENCOUNTER — Ambulatory Visit (INDEPENDENT_AMBULATORY_CARE_PROVIDER_SITE_OTHER)
Admission: RE | Admit: 2013-12-03 | Discharge: 2013-12-03 | Disposition: A | Discharge status: Home / Self Care | Payer: Medicare Other | Source: Ambulatory Visit | Attending: Internal Medicine | Admitting: Internal Medicine

## 2013-12-03 ENCOUNTER — Ambulatory Visit (INDEPENDENT_AMBULATORY_CARE_PROVIDER_SITE_OTHER): Payer: Medicare Other | Admitting: Internal Medicine

## 2013-12-03 ENCOUNTER — Encounter: Payer: Self-pay | Admitting: Internal Medicine

## 2013-12-03 ENCOUNTER — Other Ambulatory Visit: Payer: Self-pay | Admitting: Internal Medicine

## 2013-12-03 VITALS — BP 120/80 | HR 59

## 2013-12-03 DIAGNOSIS — J90 Pleural effusion, not elsewhere classified: Secondary | ICD-10-CM

## 2013-12-03 DIAGNOSIS — R0600 Dyspnea, unspecified: Secondary | ICD-10-CM

## 2013-12-03 DIAGNOSIS — R0989 Other specified symptoms and signs involving the circulatory and respiratory systems: Secondary | ICD-10-CM

## 2013-12-03 DIAGNOSIS — J918 Pleural effusion in other conditions classified elsewhere: Principal | ICD-10-CM

## 2013-12-03 DIAGNOSIS — R0609 Other forms of dyspnea: Secondary | ICD-10-CM

## 2013-12-03 DIAGNOSIS — D6489 Other specified anemias: Secondary | ICD-10-CM

## 2013-12-03 DIAGNOSIS — J189 Pneumonia, unspecified organism: Secondary | ICD-10-CM

## 2013-12-03 DIAGNOSIS — D649 Anemia, unspecified: Secondary | ICD-10-CM | POA: Insufficient documentation

## 2013-12-03 LAB — CBC WITH DIFFERENTIAL/PLATELET
Basophils Absolute: 0 10*3/uL (ref 0.0–0.1)
Basophils Relative: 0.4 % (ref 0.0–3.0)
EOS ABS: 0.2 10*3/uL (ref 0.0–0.7)
Eosinophils Relative: 2.1 % (ref 0.0–5.0)
HCT: 27.2 % — ABNORMAL LOW (ref 36.0–46.0)
Hemoglobin: 8.9 g/dL — ABNORMAL LOW (ref 12.0–15.0)
Lymphocytes Relative: 21.3 % (ref 12.0–46.0)
Lymphs Abs: 1.9 10*3/uL (ref 0.7–4.0)
MCHC: 32.6 g/dL (ref 30.0–36.0)
MCV: 87.5 fl (ref 78.0–100.0)
MONO ABS: 0.5 10*3/uL (ref 0.1–1.0)
Monocytes Relative: 6.2 % (ref 3.0–12.0)
Neutro Abs: 6.1 10*3/uL (ref 1.4–7.7)
Neutrophils Relative %: 70 % (ref 43.0–77.0)
PLATELETS: 248 10*3/uL (ref 150.0–400.0)
RBC: 3.11 Mil/uL — ABNORMAL LOW (ref 3.87–5.11)
RDW: 16.2 % — AB (ref 11.5–15.5)
WBC: 8.8 10*3/uL (ref 4.0–10.5)

## 2013-12-03 LAB — BASIC METABOLIC PANEL
BUN: 16 mg/dL (ref 6–23)
CHLORIDE: 110 meq/L (ref 96–112)
CO2: 27 mEq/L (ref 19–32)
Calcium: 8.5 mg/dL (ref 8.4–10.5)
Creatinine, Ser: 0.7 mg/dL (ref 0.4–1.2)
GFR: 106.23 mL/min (ref >60.00)
GLUCOSE: 85 mg/dL (ref 70–99)
Potassium: 4.2 mEq/L (ref 3.5–5.1)
Sodium: 141 mEq/L (ref 135–145)

## 2013-12-03 LAB — SEDIMENTATION RATE: SED RATE: 84 mm/h — AB (ref 0–22)

## 2013-12-03 LAB — BRAIN NATRIURETIC PEPTIDE: PRO B NATRI PEPTIDE: 59 pg/mL (ref 0.0–100.0)

## 2013-12-03 LAB — TSH: TSH: 0.26 u[IU]/mL — ABNORMAL LOW (ref 0.35–4.50)

## 2013-12-03 NOTE — Assessment & Plan Note (Signed)
Multifactorial but comfortable at rest on 2lpm and w/c bound at this point due more to weakness, nothing else to offer at this point and will defer further w/u to cards/ primary care.

## 2013-12-03 NOTE — Patient Instructions (Addendum)
No change in medications or oxygen  Please remember to go to the lab and x-ray department downstairs for your tests - we will call you with the results when they are available.  Dr C is her cardiologist and can determine in September whether additional pulmonary follow up is needed

## 2013-12-03 NOTE — Progress Notes (Signed)
Subjective:    Patient ID: [name], female    DOB: 1946-02-26, 68 y.o.   MRN: [id number]  HPI 68 year old female who resides at a SNF. Has multiple co-morbids: baseline dementia, Diastolic dysfxn, PVD, DM and right AKA. Admitted on 11/12/13 with resp distress w/ O2 sats in 60s on 2 liters w/ BNP of 7176, large left effusion and pulmonary edema. PCCM admitted . Found to have a HCAP with left sided parapenumonic effusion  , s/p thoracentesis with 700cc removed   11/26/13 Post Hospital follow up  Pt was admitted 7/30 with resp distress found to have large left effusion , edema w/ elevated bnp ~7000  She was tx for a recurrent aspiration HCAP , w/ IV ax . Underwent throacentesis felt to parapenumonic . Discharged on abx. W/ augmentin x 5 d. Had acute on chronic decompensated diastolic dysfunction improved with diuresis and discharged on lasix. Does report abx were extended by SNF MD for additional 5 .  Feels better since discharge. No increased cough or dyspnea. Here with family member.  CXR today is worse with increased opacity on left. Denies n/v or choking per family member.  rec Increase Lasix 40mg  daily for next 3 days .  Levaquin 750mg  daily for 5 days  Follow up Dr. Sherene Sires  In 1 week with chest xray .    12/03/2013 f/u ov/Linard Daft re: L effusion Chief Complaint  Patient presents with  . Follow-up    1 week f/u per TP with cxr. Pt denies any breathing issues-- pt daughter denies any known breathing issues.     Sleeping ok at < 30 degrees, swalling fine now Not limited by breathing from desired activities  But very inactive  No obvious day to day or daytime variabilty or assoc chronic cough or cp or chest tightness, subjective wheeze overt sinus or hb symptoms. No unusual exp hx or h/o childhood pna/ asthma or knowledge of premature birth.  Sleeping ok without nocturnal  or early am exacerbation  of respiratory  c/o's or need for noct saba. Also denies any obvious fluctuation of  symptoms with weather or environmental changes or other aggravating or alleviating factors except as outlined above   Current Medications, Allergies, Complete Past Medical History, Past Surgical History, Family History, and Social History were reviewed in [employer] record.  ROS  The following are not active complaints unless bolded sore throat, dysphagia, dental problems, itching, sneezing,  nasal congestion or excess/ purulent secretions, ear ache,   fever, chills, sweats, unintended wt loss, pleuritic or exertional cp, hemoptysis,  orthopnea pnd or leg swelling, presyncope, palpitations, heartburn, abdominal pain, anorexia, nausea, vomiting, diarrhea  or change in bowel or urinary habits, change in stools or urine, dysuria,hematuria,  rash, arthralgias, visual complaints, headache, numbness weakness or ataxia or problems with walking or coordination,  change in mood/affect or memory.                 Objective:   Physical Exam GEN: A/Ox3; pleasant , NAD, elderly , frail / comfortable at rest on 2lpm and not communicating verbally today per her daughter   HEENT:  /AT,  EACs-clear, TMs-wnl, NOSE-clear, THROAT-clear, no lesions, no postnasal drip or exudate noted.   NECK:  Supple w/ fair ROM; no JVD; normal carotid impulses w/o bruits; no thyromegaly or nodules palpated; no lymphadenopathy.  RESP  diminshed BS in bases no accessory muscle use, no dullness to percussion  CARD:  RRR, no m/r/g  ,  1+ peripheral edema, pulses intact, no cyanosis or clubbing.  GI:   Soft & nt; nml bowel sounds; no organomegaly or masses detected.  Musco: Warm bil, s/p right AKA   Neuro: alert, confused     Skin: Warm, no lesions or rashes   CXR  12/03/2013 : No change L sided densities       Lab Results  Component Value Date   ESRSEDRATE 84* 12/03/2013    Lab Results  Component Value Date   PROBNP 59.0 12/03/2013     Lab Results  Component Value Date   WBC 8.8  12/03/2013   HGB 8.9 Repeated and verified X2.* 12/03/2013   HCT 27.2* 12/03/2013   MCV 87.5 12/03/2013   PLT 248.0 12/03/2013      Chemistry      Component Value Date/Time   NA 141 12/03/2013 1129   K 4.2 12/03/2013 1129   CL 110 12/03/2013 1129   CO2 27 12/03/2013 1129   BUN 16 12/03/2013 1129   CREATININE 0.7 12/03/2013 1129      Component Value Date/Time   CALCIUM 8.5 12/03/2013 1129   ALKPHOS 74 11/13/2013 1325   AST 17 11/13/2013 1325   ALT 15 11/13/2013 1325   BILITOT 0.2* 11/13/2013 1325           Assessment & Plan:

## 2013-12-03 NOTE — Assessment & Plan Note (Signed)
Lab Results  Component Value Date   HGB 8.9 Repeated and verified X2.* 12/03/2013   HGB 10.2* 11/16/2013   HGB 9.1* 11/13/2013    Concerned re recurrent blood loss anemia, will notify primary service

## 2013-12-03 NOTE — Assessment & Plan Note (Addendum)
D/t LLL HCAP PNA 11/15/2013 -Thoracentesis w/ 700cc c/w late parapneumonic effusion wbc 3184/ mostly lymphs,  No glucose obtained cytology benign but lots of lymphocytes  - ESR 84 12/03/2013   Concerned she might have a loculated chronic process on Left or even a low grade lymphoma but no fever, orthopnea or limiting sob and really not a good candidate for vats which is what would be needed ideally if indeed this is the problem, which the high esr suggests  If tapped again needs glucose and cytology sent on the fluid and consideration for pleurex but for now will follow conservatively and keep dry if tolerates.

## 2013-12-10 LAB — FUNGUS CULTURE W SMEAR: Fungal Smear: NONE SEEN

## 2013-12-25 LAB — AFB CULTURE WITH SMEAR (NOT AT ARMC): ACID FAST SMEAR: NONE SEEN

## 2014-01-12 ENCOUNTER — Encounter: Payer: Medicare Other | Admitting: Cardiovascular Disease

## 2014-03-16 ENCOUNTER — Encounter: Payer: Medicare Other | Admitting: Cardiovascular Disease

## 2014-04-13 ENCOUNTER — Encounter: Payer: Self-pay | Admitting: *Deleted

## 2014-05-14 ENCOUNTER — Encounter: Payer: Self-pay | Admitting: *Deleted

## 2014-06-01 ENCOUNTER — Ambulatory Visit (INDEPENDENT_AMBULATORY_CARE_PROVIDER_SITE_OTHER): Payer: Medicare Other | Admitting: Cardiovascular Disease

## 2014-06-01 ENCOUNTER — Encounter: Payer: Self-pay | Admitting: Cardiovascular Disease

## 2014-06-01 VITALS — BP 120/50 | HR 60 | Ht 59.0 in | Wt 145.0 lb

## 2014-06-01 DIAGNOSIS — I442 Atrioventricular block, complete: Secondary | ICD-10-CM

## 2014-06-01 DIAGNOSIS — Z950 Presence of cardiac pacemaker: Secondary | ICD-10-CM

## 2014-06-01 DIAGNOSIS — I495 Sick sinus syndrome: Secondary | ICD-10-CM

## 2014-06-01 DIAGNOSIS — I441 Atrioventricular block, second degree: Secondary | ICD-10-CM

## 2014-06-01 HISTORY — DX: Atrioventricular block, complete: I44.2

## 2014-06-01 HISTORY — DX: Atrioventricular block, second degree: I44.1

## 2014-06-01 HISTORY — DX: Presence of cardiac pacemaker: Z95.0

## 2014-06-01 LAB — MDC_IDC_ENUM_SESS_TYPE_INCLINIC
Brady Statistic RA Percent Paced: 99.37 %
Brady Statistic RV Percent Paced: 0 %
Implantable Pulse Generator Model: 2210
Lead Channel Impedance Value: 425 Ohm
Lead Channel Pacing Threshold Amplitude: 0.75 V
Lead Channel Pacing Threshold Amplitude: 0.75 V
Lead Channel Pacing Threshold Pulse Width: 0.5 ms
Lead Channel Sensing Intrinsic Amplitude: 12 mV
Lead Channel Sensing Intrinsic Amplitude: 2.7 mV
Lead Channel Setting Pacing Amplitude: 2 V
Lead Channel Setting Pacing Amplitude: 2.5 V
Lead Channel Setting Pacing Pulse Width: 0.5 ms
Lead Channel Setting Sensing Sensitivity: 2 mV
MDC IDC MSMT BATTERY REMAINING LONGEVITY: 108 mo
MDC IDC MSMT BATTERY VOLTAGE: 2.95 V
MDC IDC MSMT LEADCHNL RA IMPEDANCE VALUE: 375 Ohm
MDC IDC MSMT LEADCHNL RA PACING THRESHOLD AMPLITUDE: 0.75 V
MDC IDC MSMT LEADCHNL RA PACING THRESHOLD PULSEWIDTH: 0.5 ms
MDC IDC MSMT LEADCHNL RV PACING THRESHOLD AMPLITUDE: 0.75 V
MDC IDC MSMT LEADCHNL RV PACING THRESHOLD PULSEWIDTH: 0.5 ms
MDC IDC MSMT LEADCHNL RV PACING THRESHOLD PULSEWIDTH: 0.5 ms
MDC IDC PG SERIAL: 7230140
MDC IDC SESS DTM: 20160216131029

## 2014-06-01 NOTE — Progress Notes (Signed)
Patient ID: [name], female   DOB: 23-Jul-1945, 69 y.o.   MRN: [id number]      Reason for office visit Second degree AV block, pacemaker check  As far as I can tell, she has not seen cardiology (at least in [place]) since [address]15  69 year old woman who resides at [facility] with multiple co-morbid conditions: DM with vascular disease and neuropathy, dementia, chronic diastolic HF, PVD s/p right AKA.   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 Extensive PAD history with previous left carotid endarterectomy and then stent in 2010 (followed by Dr. Myra Gianotti in the past), PTA-stent left iliac artery, right AKA with difficult healing 2012.  Subsequently lost to follow up both in Cardiology and Vascular surgery offices. Not sure if she was followed in [place], [place]. She appears disoriented. She tells me she will move to [place], [place] to live with her mother. When I asked how old her mother is she answered : "I don't know".  Admitted in July 2015 with a large large left pleural effusion and pulmonary edema ("pneumonia with left sided parapneumonic effusion"requiring thoracentesis ). Echo showed EF 50-55%, diastolic dysfunction and a moderate to large circumferential pericardial effusion without tamponade. CT chest showed aortic and coronary atherosclerosis. She had a pacemaker check during that admission and I suspect that is when she was returned to our system.   Normal nuclear myocardial perfusion in 2010. Insignificant CAD by coronary angio 2002. Some chart notes report CAD and coronary stents, but I find no documentation of this (may be referring to carotid stent?).  Pacemaker check shows normal device function (St. Jude Accent DR RF 2012) with 100% A pacing and <1% V pacing, no episodes of atrial fibrillation, 8-9 years generator longevity no detectable P waves, R >12 mV,  pacing threshold 0.75V@0 .5 ms in both chambers.   Allergies  Allergen Reactions  . Iohexol      Desc: pt allergic to ivp dye/iodine, unsure of reaction   . Latex Other (See Comments)    Unknown     Current Outpatient Prescriptions  Medication Sig Dispense Refill  . acetaminophen (TYLENOL) 325 MG tablet Take 650 mg by mouth every 4 (four) hours as needed (for pain).     Marland Kitchen acetaminophen (TYLENOL) 650 MG suppository Place 650 mg rectally every 8 (eight) hours as needed.    Marland Kitchen acidophilus (RISAQUAD) CAPS capsule Take 2 capsules by mouth daily.    Marland Kitchen albuterol (PROVENTIL) (2.5 MG/3ML) 0.083% nebulizer solution Take 2.5 mg by nebulization 2 (two) times daily.      Marland Kitchen amLODipine (NORVASC) 10 MG tablet Take 1 tablet (10 mg total) by mouth daily.    Marland Kitchen aspirin EC 81 MG tablet Take 81 mg by mouth daily.    . chlorhexidine (PERIDEX) 0.12 % solution Use as directed 10 mLs in the mouth or throat 4 (four) times daily.     . Cholecalciferol (VITAMIN D3) 50000 UNITS CAPS Take 1 capsule by mouth once a week.    . clopidogrel (PLAVIX) 75 MG tablet Take 75 mg by mouth daily.    Marland Kitchen escitalopram (LEXAPRO) 20 MG tablet Take 20 mg by mouth daily.    . Eyelid Cleansers (OCUSOFT LID SCRUB) PADS Place 1 each into both eyes every morning.    . ferrous sulfate 220 (44 FE) MG/5ML solution Take 330 mg by mouth daily. Mix with  juice    . fluticasone (FLONASE) 50 MCG/ACT nasal spray Place 2 sprays into both nostrils daily.    . furosemide (LASIX) 20 MG tablet Take 1 tablet (20 mg total) by mouth daily. 30 tablet   . gabapentin (NEURONTIN) 300 MG capsule Take 600 mg by mouth 2 (two) times daily.    . hydrALAZINE (APRESOLINE) 50 MG tablet Take 50 mg by mouth 4 (four) times daily.      . insulin aspart (NOVOLOG) 100 UNIT/ML injection Inject 0-9 Units into the skin 3 (three) times daily before meals. As directed    . labetalol (NORMODYNE) 300 MG tablet Take 150 mg by mouth every 8 (eight) hours.     . Liniments (SALONPAS  PAIN RELIEF PATCH EX) Apply 1 patch topically every 12 (twelve) hours.    Marland Kitchen loperamide (IMODIUM) 2 MG capsule Take 1 capsule (2 mg total) by mouth every 6 (six) hours as needed for diarrhea or loose stools. 30 capsule 0  . Multiple Vitamin (MULTIVITAMIN) tablet Take 1 tablet by mouth daily.      Marland Kitchen omeprazole (PRILOSEC) 20 MG capsule Take 20 mg by mouth daily.    Marland Kitchen oxyCODONE-acetaminophen (PERCOCET/ROXICET) 5-325 MG per tablet Take 1 tablet by mouth every 8 (eight) hours as needed for moderate pain or severe pain. 10 tablet 0  . phenol (CHLORASEPTIC) 1.4 % LIQD Use as directed 2 sprays in the mouth or throat as needed for throat irritation / pain.    . potassium chloride 20 MEQ TBCR Take 20 mEq by mouth daily.    . pravastatin (PRAVACHOL) 80 MG tablet Take 80 mg by mouth at bedtime.     . pregabalin (LYRICA) 50 MG capsule Take 50 mg by mouth 3 (three) times daily.     . promethazine (PHENERGAN) 25 MG suppository Place 25 mg rectally every 6 (six) hours as needed for nausea or vomiting.    . sennosides-docusate sodium (SENOKOT-S) 8.6-50 MG tablet Take 2 tablets by mouth 2 (two) times daily.    . vitamin C (ASCORBIC ACID) 500 MG tablet Take 500 mg by mouth 3 (three) times daily. Give with ferrous sulfate     No current facility-administered medications for this visit.    Past Medical History  Diagnosis Date  . Diabetes mellitus   . Hypertension   . Stroke   . Hyperlipidemia   . Anemia   . CAD (coronary artery disease)   . Arthritis   . Ulcer   . Hypernatremia   . Hypokalemia   . Carotid artery occlusion   . Second degree AV block, Mobitz type II 06/01/2014  . Complete heart block, transient 06/01/2014  . Pacemaker 06/01/2014    St Jude Accent DR 2012     Past Surgical History  Procedure Laterality Date  . Above knee leg amputation      RIGHT AKA  . Carotid endarterectomy    . Peripheral arterial stent graft  03/16/2002    PTA & stenting of the left iliac artery  . Cardiac  catheterization  04/10/2002    Normal  . Carotid artery angioplasty  02/22/2009    PTA & stenting Choice Protocol  . Permanent pacemaker insertion  08/08/2010    St.Jude  . Nm myocar perf wall motion  04/07/2009    normal    Family History  Problem Relation Age of Onset  . Diabetes Mother   . Stroke Mother   . Cancer Father     colon  . Diabetes Sister   .  Coronary artery disease Sister   . Diabetes Brother   . Coronary artery disease Brother     History   Social History  . Marital Status: Divorced    Spouse Name: N/A  . Number of Children: N/A  . Years of Education: N/A   Occupational History  . Not on file.   Social History Main Topics  . Smoking status: Former Smoker -- 1.00 packs/day for 41 years    Types: Cigarettes    Quit date: 04/16/2010  . Smokeless tobacco: Not on file  . Alcohol Use: No  . Drug Use: No  . Sexual Activity: Not on file   Other Topics Concern  . Not on file   Social History Narrative    Review of systems: Answers "no" to pretty much anything The patient specifically denies any chest pain at rest or with exertion, dyspnea at rest or with exertion, orthopnea, paroxysmal nocturnal dyspnea, syncope, palpitations, focal neurological deficits, intermittent claudication, lower extremity edema, unexplained weight gain, cough, hemoptysis or wheezing.  The patient also denies abdominal pain, nausea, vomiting, dysphagia, diarrhea, constipation, polyuria, polydipsia, dysuria, hematuria, frequency, urgency, abnormal bleeding or bruising, fever, chills, unexpected weight changes, mood swings, change in skin or hair texture, change in voice quality, auditory or visual problems, allergic reactions or rashes, new musculoskeletal complaints other than usual "aches and pains".   PHYSICAL EXAM BP 120/50 mmHg  Pulse 60  Ht 4\' 11"  (1.499 m)  Wt 65.772 kg (145 lb)  BMI 29.27 kg/m2  General: Alert, oriented x2 (self, location, not time), no distress Head:  no evidence of trauma, PERRL, EOMI, no exophtalmos or lid lag, no myxedema, no xanthelasma; normal ears, nose and oropharynx Neck: normal jugular venous pulsations and no hepatojugular reflux; brisk carotid pulses without delay and faint bilateral carotid bruits, left carotid scar Chest: clear to auscultation, no signs of consolidation by percussion or palpation, normal fremitus, symmetrical and full respiratory excursions, healthy subclavian pacemaker site Cardiovascular: normal position and quality of the apical impulse, regular rhythm, normal first and second heart sounds, no murmurs, rubs or gallops Abdomen: no tenderness or distention, no masses by palpation, no abnormal pulsatility or arterial bruits, normal bowel sounds, no hepatosplenomegaly Extremities: s/p R AKA; no clubbing, cyanosis or edema; 2+ radial, ulnar and brachial pulses bilaterally; 2+ right femoral with bruit; 2+ left femoral with bruit, posterior tibial and dorsalis pedis pulses are not palpable Neurological: grossly nonfocal, disoriented, question confabulation   EKG: A paced, V sensed, nonspecific T wave changes  Lipid Panel     Component Value Date/Time   CHOL 145 11/12/2013 1615   TRIG 122 09/28/2010 0500   HDL 29* 09/28/2010 0500   CHOLHDL 3.9 09/28/2010 0500   VLDL 24 09/28/2010 0500   LDLCALC 59 09/28/2010 0500    BMET    Component Value Date/Time   NA 141 12/03/2013 1129   K 4.2 12/03/2013 1129   CL 110 12/03/2013 1129   CO2 27 12/03/2013 1129   GLUCOSE 85 12/03/2013 1129   BUN 16 12/03/2013 1129   CREATININE 0.7 12/03/2013 1129   CALCIUM 8.5 12/03/2013 1129   GFRNONAA 85* 11/16/2013 0444   GFRAA >90 11/16/2013 0444     ASSESSMENT AND PLAN  Sinus node dysfunction and history of 2nd degree AV block MT2 s/p dual chamber pacemaker Normal dual chamber pacemaker function. 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 Myra GianottiBrabham  HTN Good control  Hyperlipidemia Excellent lipid profile on statin  Chronic diastolic heart failure Clinically euvolemic on very low dose loop diuretic  DM with vascular complications and neuropathy  Dementia There is no family to talk to to corroborate her statements about moving to [place]. I think this may be part of her memory problem, but cannot be sure.   Orders Placed This Encounter  Procedures  . Implantable device check   Meds ordered this encounter  Medications  . aspirin EC 81 MG tablet    Sig: Take 81 mg by mouth daily.  . clopidogrel (PLAVIX) 75 MG tablet    Sig: Take 75 mg by mouth daily.  . Cholecalciferol (VITAMIN D3) 50000 UNITS CAPS    Sig: Take 1 capsule by mouth once a week.  . escitalopram (LEXAPRO) 20 MG tablet    Sig: Take 20 mg by mouth daily.  . fluticasone (FLONASE) 50 MCG/ACT nasal spray    Sig: Place 2 sprays into both nostrils daily.  . Liniments (SALONPAS PAIN RELIEF PATCH EX)    Sig: Apply 1 patch topically every 12 (twelve) hours.  . gabapentin (NEURONTIN) 300 MG capsule    Sig: Take 600 mg by mouth 2 (two) times daily.  Marland Kitchen acetaminophen (TYLENOL) 650 MG suppository    Sig: Place 650 mg rectally every 8 (eight) hours as needed.  . promethazine (PHENERGAN) 25 MG suppository    Sig: Place 25 mg rectally every 6 (six) hours as needed for nausea or vomiting.  . phenol (CHLORASEPTIC) 1.4 % LIQD    Sig: Use as directed 2 sprays in the mouth or throat as needed for throat irritation / pain.  Marland Kitchen insulin aspart (NOVOLOG) 100 UNIT/ML injection    Sig: Inject 0-9 Units into the skin 3 (three) times daily before meals. As directed    Junious Silk, MD, [facility] [phone] office [phone] pager

## 2014-06-01 NOTE — Patient Instructions (Signed)
Remote monitoring is used to monitor your pacemaker from home. This monitoring reduces the number of office visits required to check your device to one time per year. It allows us to keep an eye on the functioning of your device to ensure it is working properly. You are scheduled for a device check from home on 08/31/2014. You may send your transmission at any time that day. If you have a wireless device, the transmission will be sent automatically. After your physician reviews your transmission, you will receive a postcard with your next transmission date.  Your physician recommends that you schedule a follow-up appointment in:12 months with Dr.Croitoru

## 2014-06-10 ENCOUNTER — Encounter: Payer: Self-pay | Admitting: Cardiovascular Disease

## 2014-08-31 ENCOUNTER — Telehealth: Payer: Self-pay | Admitting: Cardiology

## 2014-08-31 ENCOUNTER — Encounter: Payer: Medicare Other | Admitting: *Deleted

## 2014-08-31 ENCOUNTER — Telehealth: Payer: Self-pay | Admitting: Cardiovascular Disease

## 2014-08-31 NOTE — Telephone Encounter (Signed)
Confirmed remote transmission w/ pt daughter.   

## 2014-08-31 NOTE — Telephone Encounter (Signed)
Informed [name] that monitor has not been updated since 2012. I informed her that the best option is for her to call tech services and receive help w/ trouble shooting monitor. She verbalized understanding.

## 2014-08-31 NOTE — Telephone Encounter (Signed)
New message      Needing help with the remote transmission.  Monitor is beeping

## 2014-09-02 ENCOUNTER — Telehealth: Payer: Self-pay | Admitting: Cardiovascular Disease

## 2014-09-02 NOTE — Telephone Encounter (Signed)
LM for rhonda to return call.

## 2014-09-02 NOTE — Telephone Encounter (Signed)
New message ° ° ° ° °Talk to Barbara °

## 2014-09-03 ENCOUNTER — Encounter: Payer: Self-pay | Admitting: Cardiology

## 2014-09-03 NOTE — Telephone Encounter (Signed)
Spoke w/ a med tech and she informed that Bjorn LoserRhonda was off today and should be back on Monday. I will try again on Monday.

## 2014-09-07 NOTE — Telephone Encounter (Signed)
Facility agreed w/ appt.

## 2014-12-06 ENCOUNTER — Ambulatory Visit (INDEPENDENT_AMBULATORY_CARE_PROVIDER_SITE_OTHER): Payer: Medicare Other | Admitting: *Deleted

## 2014-12-06 DIAGNOSIS — Z950 Presence of cardiac pacemaker: Secondary | ICD-10-CM | POA: Diagnosis not present

## 2014-12-06 DIAGNOSIS — I495 Sick sinus syndrome: Secondary | ICD-10-CM

## 2014-12-06 LAB — CUP PACEART INCLINIC DEVICE CHECK
Date Time Interrogation Session: 20160822122639
Lead Channel Impedance Value: 350 Ohm
Lead Channel Pacing Threshold Amplitude: 0.5 V
Lead Channel Pacing Threshold Amplitude: 0.5 V
Lead Channel Pacing Threshold Amplitude: 0.75 V
Lead Channel Pacing Threshold Amplitude: 0.75 V
Lead Channel Pacing Threshold Pulse Width: 0.5 ms
Lead Channel Pacing Threshold Pulse Width: 0.5 ms
Lead Channel Sensing Intrinsic Amplitude: 1.5 mV
Lead Channel Setting Pacing Amplitude: 2.5 V
Lead Channel Setting Pacing Pulse Width: 0.5 ms
Lead Channel Setting Sensing Sensitivity: 2 mV
MDC IDC MSMT BATTERY REMAINING LONGEVITY: 96 mo
MDC IDC MSMT BATTERY VOLTAGE: 2.93 V
MDC IDC MSMT LEADCHNL RA PACING THRESHOLD PULSEWIDTH: 0.5 ms
MDC IDC MSMT LEADCHNL RA PACING THRESHOLD PULSEWIDTH: 0.5 ms
MDC IDC MSMT LEADCHNL RV IMPEDANCE VALUE: 475 Ohm
MDC IDC MSMT LEADCHNL RV SENSING INTR AMPL: 12 mV
MDC IDC SET LEADCHNL RA PACING AMPLITUDE: 2 V
MDC IDC STAT BRADY RA PERCENT PACED: 97 %
MDC IDC STAT BRADY RV PERCENT PACED: 0 %
Pulse Gen Model: 2210
Pulse Gen Serial Number: 7230140

## 2014-12-06 NOTE — Progress Notes (Signed)
Pacemaker check in clinic. Normal device function. Thresholds, sensing, impedances consistent with previous measurements. Device programmed to maximize longevity. No mode switch or high ventricular rates noted. Device programmed at appropriate safety margins. Histogram distribution appropriate for patient activity level. Device programmed to optimize intrinsic conduction. Estimated longevity 6.8-7.18yrs. ROV w/ MC in 45mo.

## 2014-12-28 ENCOUNTER — Encounter: Payer: Self-pay | Admitting: Cardiovascular Disease

## 2015-01-21 ENCOUNTER — Emergency Department (HOSPITAL_COMMUNITY): Payer: Medicare Other

## 2015-01-21 ENCOUNTER — Inpatient Hospital Stay (HOSPITAL_COMMUNITY)
Admission: EM | Admit: 2015-01-21 | Discharge: 2015-01-24 | DRG: 291 | Disposition: A | Discharge status: Skilled Nursing Facility | Payer: Medicare Other | Source: Skilled Nursing Facility | Attending: Internal Medicine | Admitting: Internal Medicine

## 2015-01-21 ENCOUNTER — Encounter (HOSPITAL_COMMUNITY): Payer: Self-pay | Admitting: Emergency Medicine

## 2015-01-21 DIAGNOSIS — J9601 Acute respiratory failure with hypoxia: Secondary | ICD-10-CM | POA: Diagnosis not present

## 2015-01-21 DIAGNOSIS — E1142 Type 2 diabetes mellitus with diabetic polyneuropathy: Secondary | ICD-10-CM | POA: Diagnosis present

## 2015-01-21 DIAGNOSIS — Z89611 Acquired absence of right leg above knee: Secondary | ICD-10-CM

## 2015-01-21 DIAGNOSIS — I313 Pericardial effusion (noninflammatory): Secondary | ICD-10-CM | POA: Diagnosis present

## 2015-01-21 DIAGNOSIS — R0602 Shortness of breath: Secondary | ICD-10-CM | POA: Diagnosis present

## 2015-01-21 DIAGNOSIS — Z8673 Personal history of transient ischemic attack (TIA), and cerebral infarction without residual deficits: Secondary | ICD-10-CM

## 2015-01-21 DIAGNOSIS — J9621 Acute and chronic respiratory failure with hypoxia: Secondary | ICD-10-CM | POA: Diagnosis present

## 2015-01-21 DIAGNOSIS — E876 Hypokalemia: Secondary | ICD-10-CM | POA: Diagnosis not present

## 2015-01-21 DIAGNOSIS — K219 Gastro-esophageal reflux disease without esophagitis: Secondary | ICD-10-CM | POA: Diagnosis present

## 2015-01-21 DIAGNOSIS — E875 Hyperkalemia: Secondary | ICD-10-CM | POA: Diagnosis present

## 2015-01-21 DIAGNOSIS — I6529 Occlusion and stenosis of unspecified carotid artery: Secondary | ICD-10-CM | POA: Diagnosis present

## 2015-01-21 DIAGNOSIS — Z87891 Personal history of nicotine dependence: Secondary | ICD-10-CM

## 2015-01-21 DIAGNOSIS — Z7982 Long term (current) use of aspirin: Secondary | ICD-10-CM | POA: Diagnosis not present

## 2015-01-21 DIAGNOSIS — D649 Anemia, unspecified: Secondary | ICD-10-CM | POA: Diagnosis present

## 2015-01-21 DIAGNOSIS — J449 Chronic obstructive pulmonary disease, unspecified: Secondary | ICD-10-CM | POA: Insufficient documentation

## 2015-01-21 DIAGNOSIS — I110 Hypertensive heart disease with heart failure: Principal | ICD-10-CM | POA: Diagnosis present

## 2015-01-21 DIAGNOSIS — E785 Hyperlipidemia, unspecified: Secondary | ICD-10-CM | POA: Diagnosis not present

## 2015-01-21 DIAGNOSIS — I5033 Acute on chronic diastolic (congestive) heart failure: Secondary | ICD-10-CM | POA: Diagnosis present

## 2015-01-21 DIAGNOSIS — Z950 Presence of cardiac pacemaker: Secondary | ICD-10-CM | POA: Diagnosis present

## 2015-01-21 DIAGNOSIS — Z95820 Peripheral vascular angioplasty status with implants and grafts: Secondary | ICD-10-CM | POA: Diagnosis not present

## 2015-01-21 DIAGNOSIS — R079 Chest pain, unspecified: Secondary | ICD-10-CM | POA: Diagnosis present

## 2015-01-21 DIAGNOSIS — I2510 Atherosclerotic heart disease of native coronary artery without angina pectoris: Secondary | ICD-10-CM | POA: Diagnosis present

## 2015-01-21 DIAGNOSIS — J441 Chronic obstructive pulmonary disease with (acute) exacerbation: Secondary | ICD-10-CM | POA: Diagnosis present

## 2015-01-21 DIAGNOSIS — Z794 Long term (current) use of insulin: Secondary | ICD-10-CM | POA: Diagnosis not present

## 2015-01-21 DIAGNOSIS — I441 Atrioventricular block, second degree: Secondary | ICD-10-CM | POA: Diagnosis present

## 2015-01-21 DIAGNOSIS — I10 Essential (primary) hypertension: Secondary | ICD-10-CM | POA: Diagnosis present

## 2015-01-21 DIAGNOSIS — F0390 Unspecified dementia without behavioral disturbance: Secondary | ICD-10-CM | POA: Diagnosis present

## 2015-01-21 DIAGNOSIS — D638 Anemia in other chronic diseases classified elsewhere: Secondary | ICD-10-CM | POA: Diagnosis present

## 2015-01-21 DIAGNOSIS — R0789 Other chest pain: Secondary | ICD-10-CM | POA: Diagnosis not present

## 2015-01-21 DIAGNOSIS — E119 Type 2 diabetes mellitus without complications: Secondary | ICD-10-CM

## 2015-01-21 DIAGNOSIS — F329 Major depressive disorder, single episode, unspecified: Secondary | ICD-10-CM | POA: Diagnosis present

## 2015-01-21 DIAGNOSIS — J810 Acute pulmonary edema: Secondary | ICD-10-CM

## 2015-01-21 DIAGNOSIS — H409 Unspecified glaucoma: Secondary | ICD-10-CM | POA: Diagnosis present

## 2015-01-21 HISTORY — DX: Chronic obstructive pulmonary disease, unspecified: J44.9

## 2015-01-21 LAB — I-STAT ARTERIAL BLOOD GAS, ED
Acid-base deficit: 3 mmol/L — ABNORMAL HIGH (ref 0.0–2.0)
Bicarbonate: 23.1 mEq/L (ref 20.0–24.0)
O2 Saturation: 92 %
PCO2 ART: 42.3 mmHg (ref 35.0–45.0)
PH ART: 7.345 — AB (ref 7.350–7.450)
TCO2: 24 mmol/L (ref 0–100)
pO2, Arterial: 69 mmHg — ABNORMAL LOW (ref 80.0–100.0)

## 2015-01-21 LAB — I-STAT CHEM 8, ED
BUN: 34 mg/dL — ABNORMAL HIGH (ref 6–20)
Calcium, Ion: 1.15 mmol/L (ref 1.13–1.30)
Chloride: 110 mmol/L (ref 101–111)
Creatinine, Ser: 1.1 mg/dL — ABNORMAL HIGH (ref 0.44–1.00)
GLUCOSE: 158 mg/dL — AB (ref 65–99)
HCT: 33 % — ABNORMAL LOW (ref 36.0–46.0)
Hemoglobin: 11.2 g/dL — ABNORMAL LOW (ref 12.0–15.0)
Potassium: 5.7 mmol/L — ABNORMAL HIGH (ref 3.5–5.1)
SODIUM: 140 mmol/L (ref 135–145)
TCO2: 22 mmol/L (ref 0–100)

## 2015-01-21 LAB — CBC WITH DIFFERENTIAL/PLATELET
BASOS ABS: 0 10*3/uL (ref 0.0–0.1)
BASOS PCT: 0 %
EOS ABS: 0.1 10*3/uL (ref 0.0–0.7)
EOS PCT: 1 %
HCT: 33.8 % — ABNORMAL LOW (ref 36.0–46.0)
Hemoglobin: 10.3 g/dL — ABNORMAL LOW (ref 12.0–15.0)
LYMPHS PCT: 7 %
Lymphs Abs: 0.8 10*3/uL (ref 0.7–4.0)
MCH: 27.2 pg (ref 26.0–34.0)
MCHC: 30.5 g/dL (ref 30.0–36.0)
MCV: 89.4 fL (ref 78.0–100.0)
MONO ABS: 0.5 10*3/uL (ref 0.1–1.0)
Monocytes Relative: 5 %
Neutro Abs: 10 10*3/uL — ABNORMAL HIGH (ref 1.7–7.7)
Neutrophils Relative %: 87 %
Platelets: 237 10*3/uL (ref 150–400)
RBC: 3.78 MIL/uL — AB (ref 3.87–5.11)
RDW: 15.6 % — ABNORMAL HIGH (ref 11.5–15.5)
WBC: 11.4 10*3/uL — AB (ref 4.0–10.5)

## 2015-01-21 LAB — COMPREHENSIVE METABOLIC PANEL
ALK PHOS: 90 U/L (ref 38–126)
ALT: 11 U/L — ABNORMAL LOW (ref 14–54)
AST: 21 U/L (ref 15–41)
Albumin: 3.1 g/dL — ABNORMAL LOW (ref 3.5–5.0)
Anion gap: 8 (ref 5–15)
BILIRUBIN TOTAL: 0.1 mg/dL — AB (ref 0.3–1.2)
BUN: 30 mg/dL — ABNORMAL HIGH (ref 6–20)
CO2: 23 mmol/L (ref 22–32)
CREATININE: 1.06 mg/dL — AB (ref 0.44–1.00)
Calcium: 8.6 mg/dL — ABNORMAL LOW (ref 8.9–10.3)
Chloride: 107 mmol/L (ref 101–111)
GFR calc Af Amer: 60 mL/min — ABNORMAL LOW (ref >60)
GFR, EST NON AFRICAN AMERICAN: 52 mL/min — AB (ref >60)
GLUCOSE: 157 mg/dL — AB (ref 65–99)
POTASSIUM: 5.8 mmol/L — AB (ref 3.5–5.1)
Sodium: 138 mmol/L (ref 135–145)
TOTAL PROTEIN: 6.2 g/dL — AB (ref 6.5–8.1)

## 2015-01-21 LAB — I-STAT CG4 LACTIC ACID, ED: Lactic Acid, Venous: 1.71 mmol/L (ref 0.5–2.0)

## 2015-01-21 LAB — I-STAT TROPONIN, ED: Troponin i, poc: 0.02 ng/mL (ref 0.00–0.08)

## 2015-01-21 MED ORDER — AMLODIPINE BESYLATE 10 MG PO TABS
10.0000 mg | ORAL_TABLET | Freq: Every day | ORAL | Status: DC
  Administered 2015-01-22 – 2015-01-24 (×3): 10 mg via ORAL
  Filled 2015-01-21: qty 2
  Filled 2015-01-21 (×2): qty 1

## 2015-01-21 MED ORDER — PHENOL 1.4 % MT LIQD
2.0000 | OROMUCOSAL | Status: DC | PRN
  Filled 2015-01-21: qty 177

## 2015-01-21 MED ORDER — VITAMIN D3 1.25 MG (50000 UT) PO CAPS: 1.0000 | ORAL_CAPSULE | ORAL | Status: DC

## 2015-01-21 MED ORDER — PREGABALIN 50 MG PO CAPS
50.0000 mg | ORAL_CAPSULE | Freq: Three times a day (TID) | ORAL | Status: DC
  Administered 2015-01-22: 50 mg via ORAL
  Filled 2015-01-21: qty 1

## 2015-01-21 MED ORDER — ACETAMINOPHEN 325 MG PO TABS: 650.0000 mg | ORAL_TABLET | ORAL | Status: DC | PRN

## 2015-01-21 MED ORDER — OCUSOFT LID SCRUB EX PADS: 1.0000 | MEDICATED_PAD | Freq: Every morning | CUTANEOUS | Status: DC

## 2015-01-21 MED ORDER — VITAMIN C 500 MG PO TABS
500.0000 mg | ORAL_TABLET | Freq: Three times a day (TID) | ORAL | Status: DC
  Administered 2015-01-22 – 2015-01-24 (×7): 500 mg via ORAL
  Filled 2015-01-21 (×8): qty 1

## 2015-01-21 MED ORDER — CLOPIDOGREL BISULFATE 75 MG PO TABS
75.0000 mg | ORAL_TABLET | Freq: Every day | ORAL | Status: DC
  Administered 2015-01-22 – 2015-01-24 (×3): 75 mg via ORAL
  Filled 2015-01-21 (×3): qty 1

## 2015-01-21 MED ORDER — FLUTICASONE PROPIONATE 50 MCG/ACT NA SUSP
2.0000 | Freq: Every day | NASAL | Status: DC
  Administered 2015-01-22 – 2015-01-24 (×3): 2 via NASAL
  Filled 2015-01-21: qty 16

## 2015-01-21 MED ORDER — SENNA-DOCUSATE SODIUM 8.6-50 MG PO TABS: 2.0000 | ORAL_TABLET | Freq: Two times a day (BID) | ORAL | Status: DC

## 2015-01-21 MED ORDER — FUROSEMIDE 10 MG/ML IJ SOLN
40.0000 mg | Freq: Once | INTRAMUSCULAR | Status: AC
  Administered 2015-01-21: 40 mg via INTRAVENOUS
  Filled 2015-01-21: qty 4

## 2015-01-21 MED ORDER — FUROSEMIDE 10 MG/ML IJ SOLN
40.0000 mg | Freq: Every day | INTRAMUSCULAR | Status: DC
  Administered 2015-01-22: 40 mg via INTRAVENOUS
  Filled 2015-01-21: qty 4

## 2015-01-21 MED ORDER — CHLORHEXIDINE GLUCONATE 0.12 % MT SOLN
10.0000 mL | Freq: Four times a day (QID) | OROMUCOSAL | Status: DC
  Administered 2015-01-22 – 2015-01-24 (×9): 10 mL via OROMUCOSAL
  Filled 2015-01-21 (×11): qty 15

## 2015-01-21 MED ORDER — PANTOPRAZOLE SODIUM 40 MG PO TBEC
40.0000 mg | DELAYED_RELEASE_TABLET | Freq: Every day | ORAL | Status: DC
  Administered 2015-01-22 – 2015-01-24 (×3): 40 mg via ORAL
  Filled 2015-01-21 (×3): qty 1

## 2015-01-21 MED ORDER — GABAPENTIN 100 MG PO CAPS
200.0000 mg | ORAL_CAPSULE | Freq: Two times a day (BID) | ORAL | Status: DC
  Administered 2015-01-22 – 2015-01-24 (×5): 200 mg via ORAL
  Filled 2015-01-21 (×5): qty 2

## 2015-01-21 MED ORDER — ALBUTEROL SULFATE (2.5 MG/3ML) 0.083% IN NEBU: 2.5000 mg | INHALATION_SOLUTION | RESPIRATORY_TRACT | Status: DC | PRN

## 2015-01-21 MED ORDER — FERROUS SULFATE 220 (44 FE) MG/5ML PO ELIX
330.0000 mg | ORAL_SOLUTION | Freq: Two times a day (BID) | ORAL | Status: DC
  Filled 2015-01-21 (×3): qty 7.5

## 2015-01-21 MED ORDER — LOPERAMIDE HCL 2 MG PO CAPS: 2.0000 mg | ORAL_CAPSULE | Freq: Four times a day (QID) | ORAL | Status: DC | PRN

## 2015-01-21 MED ORDER — ASPIRIN EC 81 MG PO TBEC
81.0000 mg | DELAYED_RELEASE_TABLET | Freq: Every day | ORAL | Status: DC
  Administered 2015-01-22 – 2015-01-24 (×3): 81 mg via ORAL
  Filled 2015-01-21 (×3): qty 1

## 2015-01-21 MED ORDER — SODIUM POLYSTYRENE SULFONATE 15 GM/60ML PO SUSP
30.0000 g | Freq: Once | ORAL | Status: AC
  Administered 2015-01-22: 30 g via ORAL
  Filled 2015-01-21: qty 120

## 2015-01-21 MED ORDER — PROMETHAZINE HCL 25 MG RE SUPP
25.0000 mg | Freq: Four times a day (QID) | RECTAL | Status: DC | PRN
  Filled 2015-01-21: qty 1

## 2015-01-21 MED ORDER — HYDRALAZINE HCL 20 MG/ML IJ SOLN: 5.0000 mg | INTRAMUSCULAR | Status: DC | PRN

## 2015-01-21 MED ORDER — ONE-DAILY MULTI VITAMINS PO TABS: 1.0000 | ORAL_TABLET | Freq: Every day | ORAL | Status: DC

## 2015-01-21 MED ORDER — HEPARIN SODIUM (PORCINE) 5000 UNIT/ML IJ SOLN
5000.0000 [IU] | Freq: Three times a day (TID) | INTRAMUSCULAR | Status: DC
  Administered 2015-01-22 – 2015-01-23 (×5): 5000 [IU] via SUBCUTANEOUS
  Filled 2015-01-21 (×5): qty 1

## 2015-01-21 MED ORDER — PRAVASTATIN SODIUM 40 MG PO TABS
80.0000 mg | ORAL_TABLET | Freq: Every day | ORAL | Status: DC
  Administered 2015-01-22 – 2015-01-23 (×2): 80 mg via ORAL
  Filled 2015-01-21: qty 1
  Filled 2015-01-21: qty 2

## 2015-01-21 MED ORDER — SODIUM CHLORIDE 0.9 % IJ SOLN
3.0000 mL | Freq: Two times a day (BID) | INTRAMUSCULAR | Status: DC
  Administered 2015-01-22 – 2015-01-24 (×4): 3 mL via INTRAVENOUS

## 2015-01-21 MED ORDER — SODIUM CHLORIDE 0.9 % IJ SOLN: 3.0000 mL | INTRAMUSCULAR | Status: DC | PRN

## 2015-01-21 MED ORDER — ONDANSETRON HCL 4 MG/2ML IJ SOLN: 4.0000 mg | Freq: Four times a day (QID) | INTRAMUSCULAR | Status: DC | PRN

## 2015-01-21 MED ORDER — IPRATROPIUM-ALBUTEROL 0.5-2.5 (3) MG/3ML IN SOLN
3.0000 mL | RESPIRATORY_TRACT | Status: DC
Start: 2015-01-22 — End: 2015-01-22
  Administered 2015-01-22 (×6): 3 mL via RESPIRATORY_TRACT
  Filled 2015-01-21 (×6): qty 3

## 2015-01-21 MED ORDER — OXYCODONE-ACETAMINOPHEN 5-325 MG PO TABS: 1.0000 | ORAL_TABLET | Freq: Three times a day (TID) | ORAL | Status: DC | PRN

## 2015-01-21 MED ORDER — CAPSAICIN 0.025 % EX PADS: 1.0000 | MEDICATED_PAD | Freq: Two times a day (BID) | CUTANEOUS | Status: DC

## 2015-01-21 MED ORDER — DOXYCYCLINE HYCLATE 100 MG PO TABS
100.0000 mg | ORAL_TABLET | Freq: Two times a day (BID) | ORAL | Status: DC
Start: 2015-01-22 — End: 2015-01-24
  Administered 2015-01-22 – 2015-01-24 (×5): 100 mg via ORAL
  Filled 2015-01-21 (×5): qty 1

## 2015-01-21 MED ORDER — RISAQUAD PO CAPS
2.0000 | ORAL_CAPSULE | Freq: Every day | ORAL | Status: DC
  Administered 2015-01-22 – 2015-01-24 (×3): 2 via ORAL
  Filled 2015-01-21 (×3): qty 2

## 2015-01-21 MED ORDER — LABETALOL HCL 100 MG PO TABS
150.0000 mg | ORAL_TABLET | Freq: Three times a day (TID) | ORAL | Status: DC
  Administered 2015-01-22 – 2015-01-24 (×7): 150 mg via ORAL
  Filled 2015-01-21 (×3): qty 2
  Filled 2015-01-21 (×3): qty 0.5
  Filled 2015-01-21 (×2): qty 2
  Filled 2015-01-21: qty 0.5

## 2015-01-21 MED ORDER — SODIUM CHLORIDE 0.9 % IV SOLN
250.0000 mL | INTRAVENOUS | Status: DC | PRN
Start: 2015-01-21 — End: 2015-01-24

## 2015-01-21 MED ORDER — NITROGLYCERIN 2 % TD OINT
1.0000 [in_us] | TOPICAL_OINTMENT | Freq: Once | TRANSDERMAL | Status: AC
  Administered 2015-01-21: 1 [in_us] via TOPICAL
  Filled 2015-01-21: qty 1

## 2015-01-21 MED ORDER — ESCITALOPRAM OXALATE 10 MG PO TABS
20.0000 mg | ORAL_TABLET | Freq: Every day | ORAL | Status: DC
  Administered 2015-01-22 – 2015-01-24 (×3): 20 mg via ORAL
  Filled 2015-01-21 (×3): qty 2

## 2015-01-21 MED ORDER — PRO-STAT SUGAR FREE PO LIQD
30.0000 mL | Freq: Two times a day (BID) | ORAL | Status: DC
  Administered 2015-01-22 – 2015-01-24 (×5): 30 mL via ORAL
  Filled 2015-01-21 (×6): qty 30

## 2015-01-21 MED ORDER — METHYLPREDNISOLONE SODIUM SUCC 125 MG IJ SOLR
60.0000 mg | Freq: Two times a day (BID) | INTRAMUSCULAR | Status: DC
  Administered 2015-01-22 (×3): 60 mg via INTRAVENOUS
  Filled 2015-01-21 (×3): qty 2

## 2015-01-21 MED ORDER — LORAZEPAM 0.5 MG PO TABS: 0.5000 mg | ORAL_TABLET | Freq: Two times a day (BID) | ORAL | Status: DC | PRN

## 2015-01-21 MED ORDER — HYDRALAZINE HCL 50 MG PO TABS
50.0000 mg | ORAL_TABLET | Freq: Four times a day (QID) | ORAL | Status: DC
  Administered 2015-01-22 – 2015-01-24 (×9): 50 mg via ORAL
  Filled 2015-01-21 (×10): qty 1

## 2015-01-21 MED ORDER — ACETAMINOPHEN 650 MG RE SUPP: 650.0000 mg | Freq: Three times a day (TID) | RECTAL | Status: DC | PRN

## 2015-01-21 MED ORDER — DM-GUAIFENESIN ER 30-600 MG PO TB12
1.0000 | ORAL_TABLET | Freq: Two times a day (BID) | ORAL | Status: DC
  Administered 2015-01-22 – 2015-01-24 (×5): 1 via ORAL
  Filled 2015-01-21: qty 1
  Filled 2015-01-21: qty 2
  Filled 2015-01-21 (×4): qty 1

## 2015-01-21 NOTE — ED Provider Notes (Signed)
CSN: [id number]     Arrival date & time 01/21/15  2051 History   First MD Initiated Contact with Patient 01/21/15 2052     Chief Complaint  Patient [name] with  . Respiratory Distress     The history is provided by the patient and the EMS personnel. No language interpreter was used.   [name] for evaluation of shortness of breath and chest pain. [name] is a resident of [facility]. [name] had sudden onset chest pain and SOB this evening.  [name] was given one nitroglycerin and her chest pain resolved. Her shortness of breath persisted. [name] was noted be 74% on room air at the nursing home. [name] was  started on a nonrebreather at 8 L with sats improving to the mid 90s. [name] did receive a breathing treatment by EMS prior to ED arrival. Per reports [name] was initially diminished bilaterally and after the breathing treatment [name] had improved air movement bilaterally. On ED arrival [name] denies chest pain but reports feeling short of breath. Level V caveat due to respiratory distress and confusion.  Past Medical History  Diagnosis Date  . Diabetes mellitus   . Hypertension   . Stroke (HCC)   . Hyperlipidemia   . Anemia   . CAD (coronary artery disease)   . Arthritis   . Ulcer   . Hypernatremia   . Hypokalemia   . Carotid artery occlusion   . Second degree AV block, Mobitz type II 06/01/2014  . Complete heart block, transient (HCC) 06/01/2014  . Pacemaker 06/01/2014    St Jude Accent DR 2012    Past Surgical History  Procedure Laterality Date  . Above knee leg amputation      RIGHT AKA  . Carotid endarterectomy    . Peripheral arterial stent graft  03/16/2002    PTA & stenting of the left iliac artery  . Cardiac catheterization  04/10/2002    Normal  . Carotid artery angioplasty  02/22/2009    PTA & stenting Choice Protocol  . Permanent pacemaker insertion  08/08/2010    St.Jude  . Nm myocar perf wall motion  04/07/2009    normal   Family History  Problem Relation  Age of Onset  . Diabetes Mother   . Stroke Mother   . Cancer Father     colon  . Diabetes Sister   . Coronary artery disease Sister   . Diabetes Brother   . Coronary artery disease Brother    Social History  Substance Use Topics  . Smoking status: Former Smoker -- 1.00 packs/day for 41 years    Types: Cigarettes    Quit date: 04/16/2010  . Smokeless tobacco: None  . Alcohol Use: No   OB History    No data available     Review of Systems  Unable to perform ROS     Allergies  Iohexol and Latex  Home Medications   Prior to Admission medications   Medication Sig Start Date End Date Taking? Authorizing Provider  acetaminophen (TYLENOL) 325 MG tablet Take 650 mg by mouth every 4 (four) hours as needed (for pain).    Yes Historical Provider, MD  acetaminophen (TYLENOL) 650 MG suppository Place 650 mg rectally every 8 (eight) hours as needed for fever.    Yes Historical Provider, MD  acidophilus (RISAQUAD) CAPS capsule Take 2 capsules by mouth daily. 11/17/13  Yes Leroy Sea, MD  albuterol (PROVENTIL) (2.5 MG/3ML) 0.083% nebulizer solution Take 2.5 mg by nebulization  2 (two) times daily.     Yes Historical Provider, MD  Amino Acids-Protein Hydrolys (FEEDING SUPPLEMENT, PRO-STAT SUGAR FREE 64,) LIQD Take 30 mLs by mouth 2 (two) times daily.   Yes Historical Provider, MD  amLODipine (NORVASC) 10 MG tablet Take 1 tablet (10 mg total) by mouth daily. 11/17/13  Yes Leroy Sea, MD  aspirin EC 81 MG tablet Take 81 mg by mouth daily.   Yes Historical Provider, MD  Capsaicin (SALONPAS-HOT) 0.025 % PADS Apply 1 patch topically every 12 (twelve) hours. Use for 12 hours, hen remove for 12 hours and repeat   Yes Historical Provider, MD  chlorhexidine (PERIDEX) 0.12 % solution Use as directed 10 mLs in the mouth or throat 4 (four) times daily.    Yes Historical Provider, MD  Cholecalciferol (VITAMIN D3) 50000 UNITS CAPS Take 1 capsule by mouth every 30 (thirty) days.    Yes Historical  Provider, MD  clopidogrel (PLAVIX) 75 MG tablet Take 75 mg by mouth daily.   Yes Historical Provider, MD  escitalopram (LEXAPRO) 20 MG tablet Take 20 mg by mouth daily.   Yes Historical Provider, MD  Eyelid Cleansers (OCUSOFT LID SCRUB) PADS Place 1 each into both eyes every morning.   Yes Historical Provider, MD  ferrous sulfate 220 (44 FE) MG/5ML solution Take 330 mg by mouth 2 (two) times daily with a meal. 7.10mls dose Mix with juice   Yes Historical Provider, MD  fluticasone (FLONASE) 50 MCG/ACT nasal spray Place 2 sprays into both nostrils daily.   Yes Historical Provider, MD  furosemide (LASIX) 20 MG tablet Take 1 tablet (20 mg total) by mouth daily. 11/17/13  Yes Leroy Sea, MD  gabapentin (NEURONTIN) 100 MG capsule Take 200 mg by mouth 2 (two) times daily.   Yes Historical Provider, MD  hydrALAZINE (APRESOLINE) 50 MG tablet Take 50 mg by mouth 4 (four) times daily.     Yes Historical Provider, MD  insulin aspart (NOVOLOG) 100 UNIT/ML injection Inject 0-9 Units into the skin 3 (three) times daily before meals. As directed   Yes Historical Provider, MD  labetalol (NORMODYNE) 300 MG tablet Take 150 mg by mouth every 8 (eight) hours.    Yes Historical Provider, MD  loperamide (IMODIUM) 2 MG capsule Take 1 capsule (2 mg total) by mouth every 6 (six) hours as needed for diarrhea or loose stools. 11/17/13  Yes Leroy Sea, MD  LORazepam (ATIVAN) 0.5 MG tablet Take 0.5 mg by mouth every 12 (twelve) hours as needed for anxiety. 10/14/14  Yes Historical Provider, MD  Multiple Vitamin (MULTIVITAMIN) tablet Take 1 tablet by mouth daily.     Yes Historical Provider, MD  omeprazole (PRILOSEC) 20 MG capsule Take 20 mg by mouth daily.   Yes Historical Provider, MD  oxyCODONE-acetaminophen (PERCOCET/ROXICET) 5-325 MG per tablet Take 1 tablet by mouth every 8 (eight) hours as needed for moderate pain or severe pain. 11/17/13  Yes Leroy Sea, MD  phenol (CHLORASEPTIC) 1.4 % LIQD Use as directed 2  sprays in the mouth or throat as needed for throat irritation / pain.   Yes Historical Provider, MD  pravastatin (PRAVACHOL) 80 MG tablet Take 80 mg by mouth at bedtime.    Yes Historical Provider, MD  pregabalin (LYRICA) 50 MG capsule Take 50 mg by mouth 3 (three) times daily.    Yes Historical Provider, MD  promethazine (PHENERGAN) 25 MG suppository Place 25 mg rectally every 6 (six) hours as needed for nausea or vomiting.  Yes Historical Provider, MD  sennosides-docusate sodium (SENOKOT-S) 8.6-50 MG tablet Take 2 tablets by mouth 2 (two) times daily.   Yes Historical Provider, MD  vitamin C (ASCORBIC ACID) 500 MG tablet Take 500 mg by mouth 3 (three) times daily. Give with ferrous sulfate   Yes Historical Provider, MD  potassium chloride 20 MEQ TBCR Take 20 mEq by mouth daily. 11/17/13   Leroy Sea, MD   BP 170/43 mmHg  Pulse 59  Temp(Src) 98 F (36.7 C) (Oral)  Resp 18  SpO2 98% Physical Exam  Constitutional: [name] appears well-developed and well-nourished. [name] appears distressed.  HENT:  Head: Normocephalic and atraumatic.  Cardiovascular: Normal rate and regular rhythm.   No murmur heard. Pulmonary/Chest: [name] is in respiratory distress.  Diffuse rhonchi. Accessory muscle use.  Abdominal: Soft. There is no tenderness. There is no rebound and no guarding.  Musculoskeletal:  Right AKA, left BKA.  Neurological: [name] is alert.  Confused, speaks in short sentences.  Skin: Skin is warm and dry.  Psychiatric: [name] has a normal mood and affect. Her behavior is normal.  Nursing note and vitals reviewed.   ED Course  Procedures  CRITICAL CARE Performed by: Tilden Fossa   Total critical care time: 30 minutes  Critical care time was exclusive of separately billable procedures and treating other patients.  Critical care was necessary to treat or prevent imminent or life-threatening deterioration.  Critical care was time spent personally by me on the following activities:  development of treatment plan with patient and/or surrogate as well as nursing, discussions with consultants, evaluation of patient's response to treatment, examination of patient, obtaining history from patient or surrogate, ordering and performing treatments and interventions, ordering and review of laboratory studies, ordering and review of radiographic studies, pulse oximetry and re-evaluation of patient's condition.  Labs Review Labs Reviewed  COMPREHENSIVE METABOLIC PANEL - Abnormal; Notable for the following:    Potassium 5.8 (*)    Glucose, Bld 157 (*)    BUN 30 (*)    Creatinine, Ser 1.06 (*)    Calcium 8.6 (*)    Total Protein 6.2 (*)    Albumin 3.1 (*)    ALT 11 (*)    Total Bilirubin 0.1 (*)    GFR calc non Af Amer 52 (*)    GFR calc Af Amer 60 (*)    All other components within normal limits  CBC WITH DIFFERENTIAL/PLATELET - Abnormal; Notable for the following:    WBC 11.4 (*)    RBC 3.78 (*)    Hemoglobin 10.3 (*)    HCT 33.8 (*)    RDW 15.6 (*)    Neutro Abs 10.0 (*)    All other components within normal limits  I-STAT CHEM 8, ED - Abnormal; Notable for the following:    Potassium 5.7 (*)    BUN 34 (*)    Creatinine, Ser 1.10 (*)    Glucose, Bld 158 (*)    Hemoglobin 11.2 (*)    HCT 33.0 (*)    All other components within normal limits  I-STAT ARTERIAL BLOOD GAS, ED - Abnormal; Notable for the following:    pH, Arterial 7.345 (*)    pO2, Arterial 69.0 (*)    Acid-base deficit 3.0 (*)    All other components within normal limits  BRAIN NATRIURETIC PEPTIDE  I-STAT TROPOININ, ED  I-STAT CG4 LACTIC ACID, ED    Imaging Review Dg Chest Port 1 View  01/21/2015   CLINICAL DATA:  Acute  onset of shortness of breath and cough. Initial encounter.  EXAM: PORTABLE CHEST 1 VIEW  COMPARISON:  Chest radiograph performed 12/03/2013  FINDINGS: The lungs are hypoexpanded. Vascular congestion is noted, with increased interstitial markings, compatible with pulmonary edema. A  small left pleural effusion is noted. No pneumothorax is seen.  The cardiomediastinal silhouette is enlarged. A pacemaker is noted overlying the left chest wall, with leads ending overlying the right atrium and right ventricle. No acute osseous abnormalities are seen.  IMPRESSION: Lungs hypoexpanded. Vascular congestion and cardiomegaly, with increased interstitial markings, compatible with pulmonary edema. Small left pleural effusion noted.   Electronically Signed   By: Roanna Raider M.D.   On: 01/21/2015 21:37   I have personally reviewed and evaluated these images and lab results as part of my medical decision-making.   EKG Interpretation   Date/Time:  Friday January 21 2015 21:05:18 EDT Ventricular Rate:  60 PR Interval:  185 QRS Duration: 80 QT Interval:  435 QTC Calculation: 435 R Axis:   43 Text Interpretation:  Atrial-paced rhythm Confirmed by Lincoln Brigham [phone])  on 01/21/2015 10:35:05 PM      MDM   Final diagnoses:  Acute pulmonary edema (HCC)  Acute respiratory failure with hypoxia (HCC)    Pt with hx/o CHF, COPD here with cp (now resolved) increased SOB.  Pt with significant increased WOB on initial eval, started on bipap.  On repeat eval in the ED after lasix, nitroglycerin paste, BiPap trial pt is clinically much improved.  D/w pt and family need for admission for further tmt.  Discussed with Dr. Clyde Lundborg regarding admission.      Tilden Fossa, MD 01/22/15 [phone]

## 2015-01-21 NOTE — Progress Notes (Signed)
Pt in no distress at this time. Taken off BIPAP and placed on 3lt [place]. Will monotir

## 2015-01-21 NOTE — ED Notes (Signed)
Respiratory at bedside removing bi-pap

## 2015-01-21 NOTE — H&P (Signed)
[facility] History and Physical  [name] KGM:[id number] DOB: April 04, 1946 DOA: 01/21/2015  Referring physician: ED physician PCP: Georgann Housekeeper, MD  Specialists:   Chief Complaint: Shortness of breath, dry cough, chest pain  HPI: [name] is a 69 y.o. female with PMH of hypertension, hyperlipidemia, diabetes mellitus, GERD, depression, dementia, CAD, S/P stent placement, P VA (s/p of right iliac artery stent placement), carotid artery endarterectomy, stroke, complete heart block, s/p of pacemaker placement, right AKA, who presents with shortness of breath, dry cough and the chest pain.  Patient comes from [facility]. She started having SOB and chest pain this evening. Patient had oxygen saturation of 74%. Her chest pain was located in the mediastinal, constant, nonradiating. It has completely resolved after treated with nitroglycerin. Currently no chest pain. Patient has dry cough, no fever or chills. Patient has expiratory wheezing per EMS. Patient does not have abdominal pain, diarrhea, symptoms of UTI, unilateral weakness.  In ED, patient was found to have pulmonary edema, negative troponin, temperature normal, bradycardia, potassium of 5.8 without EKG changes, WBC 11.4. Patient's admitted to inpatient for further eval and treatment.  Where does patient live?   At home  Can patient participate in ADLs?  None Review of Systems:   General: no fevers, chills, no changes in body weight, has poor appetite, has fatigue HEENT: no blurry vision, hearing changes or sore throat Pulm: has dyspnea, coughing, no wheezing CV: had chest pain, no palpitations Abd: no nausea, vomiting, abdominal pain, diarrhea, constipation GU: no dysuria, burning on urination, increased urinary frequency, hematuria  Ext: no leg edema Neuro: no unilateral weakness, numbness, or tingling, no vision change or hearing loss Skin: no rash MSK: No muscle spasm, no deformity, no  limitation of range of movement in spin Heme: No easy bruising.  Travel history: No recent long distant travel.  Allergy:  Allergies  Allergen Reactions  . Iohexol Other (See Comments)     Desc: pt allergic to ivp dye/iodine, unsure of reaction   . Latex Other (See Comments)    Unknown     Past Medical History  Diagnosis Date  . Diabetes mellitus   . Hypertension   . Stroke (HCC)   . Hyperlipidemia   . Anemia   . CAD (coronary artery disease)   . Arthritis   . Ulcer   . Hypernatremia   . Hypokalemia   . Carotid artery occlusion   . Second degree AV block, Mobitz type II 06/01/2014  . Complete heart block, transient (HCC) 06/01/2014  . Pacemaker 06/01/2014    St Jude Accent DR 2012   . COPD (chronic obstructive pulmonary disease) [facility])     Past Surgical History  Procedure Laterality Date  . Above knee leg amputation      RIGHT AKA  . Carotid endarterectomy    . Peripheral arterial stent graft  03/16/2002    PTA & stenting of the left iliac artery  . Cardiac catheterization  04/10/2002    Normal  . Carotid artery angioplasty  02/22/2009    PTA & stenting Choice Protocol  . Permanent pacemaker insertion  08/08/2010    St.Jude  . Nm myocar perf wall motion  04/07/2009    normal    Social History:  reports that she quit smoking about 4 years ago. Her smoking use included Cigarettes. She has a 41 pack-year smoking history. She does not have any smokeless tobacco history on file. She reports that she does not drink alcohol or use  illicit drugs.  Family History:  Family History  Problem Relation Age of Onset  . Diabetes Mother   . Stroke Mother   . Cancer Father     colon  . Diabetes Sister   . Coronary artery disease Sister   . Diabetes Brother   . Coronary artery disease Brother      Prior to Admission medications   Medication Sig Start Date End Date Taking? Authorizing Provider  acetaminophen (TYLENOL) 325 MG tablet Take 650 mg by mouth every 4 (four) hours as  needed (for pain).    Yes Historical Provider, MD  acetaminophen (TYLENOL) 650 MG suppository Place 650 mg rectally every 8 (eight) hours as needed for fever.    Yes Historical Provider, MD  acidophilus (RISAQUAD) CAPS capsule Take 2 capsules by mouth daily. 11/17/13  Yes Leroy Sea, MD  albuterol (PROVENTIL) (2.5 MG/3ML) 0.083% nebulizer solution Take 2.5 mg by nebulization 2 (two) times daily.     Yes Historical Provider, MD  Amino Acids-Protein Hydrolys (FEEDING SUPPLEMENT, PRO-STAT SUGAR FREE 64,) LIQD Take 30 mLs by mouth 2 (two) times daily.   Yes Historical Provider, MD  amLODipine (NORVASC) 10 MG tablet Take 1 tablet (10 mg total) by mouth daily. 11/17/13  Yes Leroy Sea, MD  aspirin EC 81 MG tablet Take 81 mg by mouth daily.   Yes Historical Provider, MD  Capsaicin (SALONPAS-HOT) 0.025 % PADS Apply 1 patch topically every 12 (twelve) hours. Use for 12 hours, hen remove for 12 hours and repeat   Yes Historical Provider, MD  chlorhexidine (PERIDEX) 0.12 % solution Use as directed 10 mLs in the mouth or throat 4 (four) times daily.    Yes Historical Provider, MD  Cholecalciferol (VITAMIN D3) 50000 UNITS CAPS Take 1 capsule by mouth every 30 (thirty) days.    Yes Historical Provider, MD  clopidogrel (PLAVIX) 75 MG tablet Take 75 mg by mouth daily.   Yes Historical Provider, MD  escitalopram (LEXAPRO) 20 MG tablet Take 20 mg by mouth daily.   Yes Historical Provider, MD  Eyelid Cleansers (OCUSOFT LID SCRUB) PADS Place 1 each into both eyes every morning.   Yes Historical Provider, MD  ferrous sulfate 220 (44 FE) MG/5ML solution Take 330 mg by mouth 2 (two) times daily with a meal. 7.18mls dose Mix with juice   Yes Historical Provider, MD  fluticasone (FLONASE) 50 MCG/ACT nasal spray Place 2 sprays into both nostrils daily.   Yes Historical Provider, MD  furosemide (LASIX) 20 MG tablet Take 1 tablet (20 mg total) by mouth daily. 11/17/13  Yes Leroy Sea, MD  gabapentin (NEURONTIN) 100  MG capsule Take 200 mg by mouth 2 (two) times daily.   Yes Historical Provider, MD  hydrALAZINE (APRESOLINE) 50 MG tablet Take 50 mg by mouth 4 (four) times daily.     Yes Historical Provider, MD  insulin aspart (NOVOLOG) 100 UNIT/ML injection Inject 0-9 Units into the skin 3 (three) times daily before meals. As directed   Yes Historical Provider, MD  labetalol (NORMODYNE) 300 MG tablet Take 150 mg by mouth every 8 (eight) hours.    Yes Historical Provider, MD  loperamide (IMODIUM) 2 MG capsule Take 1 capsule (2 mg total) by mouth every 6 (six) hours as needed for diarrhea or loose stools. 11/17/13  Yes Leroy Sea, MD  LORazepam (ATIVAN) 0.5 MG tablet Take 0.5 mg by mouth every 12 (twelve) hours as needed for anxiety. 10/14/14  Yes Historical Provider, MD  Multiple Vitamin (MULTIVITAMIN) tablet Take 1 tablet by mouth daily.     Yes Historical Provider, MD  omeprazole (PRILOSEC) 20 MG capsule Take 20 mg by mouth daily.   Yes Historical Provider, MD  oxyCODONE-acetaminophen (PERCOCET/ROXICET) 5-325 MG per tablet Take 1 tablet by mouth every 8 (eight) hours as needed for moderate pain or severe pain. 11/17/13  Yes Leroy Sea, MD  phenol (CHLORASEPTIC) 1.4 % LIQD Use as directed 2 sprays in the mouth or throat as needed for throat irritation / pain.   Yes Historical Provider, MD  pravastatin (PRAVACHOL) 80 MG tablet Take 80 mg by mouth at bedtime.    Yes Historical Provider, MD  pregabalin (LYRICA) 50 MG capsule Take 50 mg by mouth 3 (three) times daily.    Yes Historical Provider, MD  promethazine (PHENERGAN) 25 MG suppository Place 25 mg rectally every 6 (six) hours as needed for nausea or vomiting.   Yes Historical Provider, MD  sennosides-docusate sodium (SENOKOT-S) 8.6-50 MG tablet Take 2 tablets by mouth 2 (two) times daily.   Yes Historical Provider, MD  vitamin C (ASCORBIC ACID) 500 MG tablet Take 500 mg by mouth 3 (three) times daily. Give with ferrous sulfate   Yes Historical Provider, MD   potassium chloride 20 MEQ TBCR Take 20 mEq by mouth daily. 11/17/13   Leroy Sea, MD    Physical Exam: Filed Vitals:   01/21/15 2230 01/21/15 2245 01/21/15 2300 01/21/15 2334  BP: 180/49 169/45 168/78 137/94  Pulse: 59 60 59 60  Temp:      TempSrc:      Resp: SpO2: 99% 98% 100% 99%   General: Not in acute distress HEENT:       Eyes: PERRL, EOMI, no scleral icterus.       ENT: No discharge from the ears and nose, no pharynx injection, no tonsillar enlargement.        Neck: positve JVD, no bruit, no mass felt. Heme: No neck lymph node enlargement. Cardiac: S1/S2, RRR, No murmurs, No gallops or rubs. Pulm: has rales and chonchi bilaterally,  No rubs. Abd: Soft, nondistended, nontender, no rebound pain, no organomegaly, BS present. Ext: 1+ pitting leg edema. 2+DP/PT pulse bilaterally. Musculoskeletal: No joint deformities, No joint redness or warmth, no limitation of ROM in spin. Skin: No rashes.  Neuro: Alert, oriented X3, cranial nerves II-XII grossly intact, moves all extremities. Psych: Patient is not psychotic, no suicidal or hemocidal ideation.  Labs on Admission:  Basic Metabolic Panel:  Recent Labs Lab 01/21/15 2110 01/21/15 2139  NA 138 140  K 5.8* 5.7*  CL 107 110  CO2 23  --   GLUCOSE 157* 158*  BUN 30* 34*  CREATININE 1.06* 1.10*  CALCIUM 8.6*  --    Liver Function Tests:  Recent Labs Lab 01/21/15 2110  AST 21  ALT 11*  ALKPHOS 90  BILITOT 0.1*  PROT 6.2*  ALBUMIN 3.1*   No results for input(s): LIPASE, AMYLASE in the last 168 hours. No results for input(s): AMMONIA in the last 168 hours. CBC:  Recent Labs Lab 01/21/15 2110 01/21/15 2139  WBC 11.4*  --   NEUTROABS 10.0*  --   HGB 10.3* 11.2*  HCT 33.8* 33.0*  MCV 89.4  --   PLT 237  --    Cardiac Enzymes: No results for input(s): CKTOTAL, CKMB, CKMBINDEX, TROPONINI in the last 168 hours.  BNP (last 3 results) No results for input(s): BNP in the last  8760  hours.  ProBNP (last 3 results) No results for input(s): PROBNP in the last 8760 hours.  CBG: No results for input(s): GLUCAP in the last 168 hours.  Radiological Exams on Admission: Dg Chest Port 1 View  01/21/2015   CLINICAL DATA:  Acute onset of shortness of breath and cough. Initial encounter.  EXAM: PORTABLE CHEST 1 VIEW  COMPARISON:  Chest radiograph performed 12/03/2013  FINDINGS: The lungs are hypoexpanded. Vascular congestion is noted, with increased interstitial markings, compatible with pulmonary edema. A small left pleural effusion is noted. No pneumothorax is seen.  The cardiomediastinal silhouette is enlarged. A pacemaker is noted overlying the left chest wall, with leads ending overlying the right atrium and right ventricle. No acute osseous abnormalities are seen.  IMPRESSION: Lungs hypoexpanded. Vascular congestion and cardiomegaly, with increased interstitial markings, compatible with pulmonary edema. Small left pleural effusion noted.   Electronically Signed   By: Roanna Raider M.D.   On: 01/21/2015 21:37    EKG: Independently reviewed.  Abnormal findings: Paced rhythm Assessment/Plan Principal Problem:   Acute on chronic respiratory failure with hypoxia (HCC) Active Problems:   Diabetes mellitus without complication (HCC)   HLD (hyperlipidemia)   GLAUCOMA NOS   Essential hypertension   Carotid artery stenosis   Dementia   Anemia   Second degree AV block, Mobitz type II   Pacemaker   Acute on chronic diastolic (congestive) heart failure (HCC)   Hyperkalemia   COPD exacerbation (HCC)   Chest pain  Acute on chronic respiratory failure with hypoxia (HCC): This is most likely due to combination of exacerbation of COPD (has rhonchi and wheezing) and CHF (has positive JVD, leg edema and probably edema on chest x-ray).  -Will admit to SDU  -Treat COPD and CHF exacerbation as below  Acute on chronic diastolic CHF: 2-D echo on 11/13/13 showed EF of 50-55%. Patient is on  Lasix 20 mg daily at SNF.  -Lasix 40 mg bid by IV -trop x 3 -2d echo -will continue home labetalol, ASA -Daily weights -strict I/O's -Low salt diet -No lisinopril due to slightly worsening renal function.  COPD exacerbation (HCC): -Nebulizers: scheduled Duoneb and prn albuterol -Solu-Medrol 60 mg IV bid  -Oral doxycycline for 5 days.  -Mucinex for cough  -Sputum culture and Flu pcr  DM-II: Last A1c 5.3 on 09/28/10, well controled. Patient is taking NovoLog at home -SSI  HLD: Last LDL was 59 on 09/28/10 -Continue home medications: Pravastatin  Essential hypertension: - On IV lasix - continue oral amlodipine, hydralazine, labetalol - IV hydralazine when necessary - ED started nitroglycerin patch  CP and CAD: s/p of stent 2010. No CP currently. -f/u trop x 3 - 2d echo. -ASA, lipitor, labetalol  carotid artery stenosis: -ASA  Hyperkalemia: Potassium 5.8, no EKG changes. -On Lasix iv -Kayexalate 30 g 1  DVT ppx: SQ Heparin   Code Status: Full code Family Communication:   Yes, patient's daughter and son-in-law  at bed side Disposition Plan: Admit to inpatient   Date of Service 01/22/2015    Lorretta Harp [facility] Pager [phone]  If 7PM-7AM, please contact night-coverage [email or web address] Password [email or web address] 01/22/2015, 12:15 AM

## 2015-01-21 NOTE — ED Notes (Signed)
PER EMS: Patient comes from [facility] c/o respiratory distress and sudden onset CP this evening.  Patient felt sharp midsternal CP, nonradiating, and was given 1 NTG tablet by staff that relieved pain to 0.  Patient then began to have SOB 74% RA, put on non-rebreather 8L O2/min SpO2 increased to 96-97%.  Patient nonverbal initially, working on breathing, per EMS.  Patient appears a little more alert now that breating has improved from earlier. Patient has expiratory wheezing L upper lobe and cough.  Given duoneb  albuterol en route by EMS.  PAtient NSR 60 atrial paced, CBG 136, 174/84.

## 2015-01-21 NOTE — ED Notes (Signed)
MD to see and assess patient before RN assessment. 

## 2015-01-21 NOTE — ED Notes (Signed)
Admitting MD at bedside.

## 2015-01-21 NOTE — Procedures (Signed)
Pt arrived by EMS. Distress noted. Pt placed on BIPAP per MD order.

## 2015-01-22 ENCOUNTER — Inpatient Hospital Stay (HOSPITAL_COMMUNITY): Payer: Medicare Other

## 2015-01-22 DIAGNOSIS — E875 Hyperkalemia: Secondary | ICD-10-CM

## 2015-01-22 DIAGNOSIS — E785 Hyperlipidemia, unspecified: Secondary | ICD-10-CM

## 2015-01-22 DIAGNOSIS — J441 Chronic obstructive pulmonary disease with (acute) exacerbation: Secondary | ICD-10-CM

## 2015-01-22 DIAGNOSIS — J9621 Acute and chronic respiratory failure with hypoxia: Secondary | ICD-10-CM

## 2015-01-22 DIAGNOSIS — J810 Acute pulmonary edema: Secondary | ICD-10-CM

## 2015-01-22 DIAGNOSIS — I10 Essential (primary) hypertension: Secondary | ICD-10-CM

## 2015-01-22 DIAGNOSIS — R0789 Other chest pain: Secondary | ICD-10-CM | POA: Insufficient documentation

## 2015-01-22 DIAGNOSIS — R079 Chest pain, unspecified: Secondary | ICD-10-CM | POA: Diagnosis present

## 2015-01-22 DIAGNOSIS — I5033 Acute on chronic diastolic (congestive) heart failure: Secondary | ICD-10-CM

## 2015-01-22 LAB — GLUCOSE, CAPILLARY
Glucose-Capillary: 148 mg/dL — ABNORMAL HIGH (ref 65–99)
Glucose-Capillary: 124 mg/dL — ABNORMAL HIGH (ref 65–99)

## 2015-01-22 LAB — BASIC METABOLIC PANEL
Anion gap: 10 (ref 5–15)
BUN: 29 mg/dL — AB (ref 6–20)
CALCIUM: 8.9 mg/dL (ref 8.9–10.3)
CHLORIDE: 110 mmol/L (ref 101–111)
CO2: 24 mmol/L (ref 22–32)
CREATININE: 1.02 mg/dL — AB (ref 0.44–1.00)
GFR calc non Af Amer: 54 mL/min — ABNORMAL LOW (ref >60)
Glucose, Bld: 142 mg/dL — ABNORMAL HIGH (ref 65–99)
Potassium: 4.3 mmol/L (ref 3.5–5.1)
SODIUM: 144 mmol/L (ref 135–145)

## 2015-01-22 LAB — MAGNESIUM: Magnesium: 2.7 mg/dL — ABNORMAL HIGH (ref 1.7–2.4)

## 2015-01-22 LAB — APTT: aPTT: 34 s (ref 24–37)

## 2015-01-22 LAB — TROPONIN I
TROPONIN I: 0.04 ng/mL — AB (ref <0.031)
Troponin I: <0.03 ng/mL

## 2015-01-22 LAB — PROTIME-INR
INR: 1.11 (ref 0.00–1.49)
Prothrombin Time: 14.5 seconds (ref 11.6–15.2)

## 2015-01-22 LAB — CBG MONITORING, ED
GLUCOSE-CAPILLARY: 140 mg/dL — AB (ref 65–99)
Glucose-Capillary: 133 mg/dL — ABNORMAL HIGH (ref 65–99)

## 2015-01-22 LAB — BRAIN NATRIURETIC PEPTIDE: B Natriuretic Peptide: 203.7 pg/mL — ABNORMAL HIGH (ref 0.0–100.0)

## 2015-01-22 LAB — MRSA PCR SCREENING: MRSA by PCR: POSITIVE — AB

## 2015-01-22 MED ORDER — SENNOSIDES-DOCUSATE SODIUM 8.6-50 MG PO TABS
2.0000 | ORAL_TABLET | Freq: Two times a day (BID) | ORAL | Status: DC
  Administered 2015-01-22 – 2015-01-23 (×3): 2 via ORAL
  Filled 2015-01-22 (×4): qty 2

## 2015-01-22 MED ORDER — ACETAMINOPHEN 325 MG PO TABS: 650.0000 mg | ORAL_TABLET | Freq: Four times a day (QID) | ORAL | Status: DC | PRN

## 2015-01-22 MED ORDER — FERROUS SULFATE 300 (60 FE) MG/5ML PO SYRP
300.0000 mg | ORAL_SOLUTION | Freq: Two times a day (BID) | ORAL | Status: DC
  Administered 2015-01-22: 300 mg via ORAL
  Filled 2015-01-22 (×3): qty 5

## 2015-01-22 MED ORDER — INSULIN ASPART 100 UNIT/ML ~~LOC~~ SOLN
0.0000 [IU] | Freq: Three times a day (TID) | SUBCUTANEOUS | Status: DC
  Administered 2015-01-22 – 2015-01-23 (×5): 1 [IU] via SUBCUTANEOUS
  Filled 2015-01-22: qty 1

## 2015-01-22 MED ORDER — VITAMIN D (ERGOCALCIFEROL) 1.25 MG (50000 UNIT) PO CAPS: 50000.0000 [IU] | ORAL_CAPSULE | ORAL | Status: DC

## 2015-01-22 MED ORDER — OXYCODONE-ACETAMINOPHEN 5-325 MG PO TABS: 1.0000 | ORAL_TABLET | Freq: Three times a day (TID) | ORAL | Status: DC | PRN

## 2015-01-22 MED ORDER — FUROSEMIDE 10 MG/ML IJ SOLN
40.0000 mg | Freq: Two times a day (BID) | INTRAMUSCULAR | Status: DC
  Administered 2015-01-22 – 2015-01-23 (×3): 40 mg via INTRAVENOUS
  Filled 2015-01-22 (×3): qty 4

## 2015-01-22 MED ORDER — IPRATROPIUM-ALBUTEROL 0.5-2.5 (3) MG/3ML IN SOLN
3.0000 mL | Freq: Two times a day (BID) | RESPIRATORY_TRACT | Status: DC
  Administered 2015-01-23 – 2015-01-24 (×3): 3 mL via RESPIRATORY_TRACT
  Filled 2015-01-22 (×3): qty 3

## 2015-01-22 MED ORDER — PNEUMOCOCCAL VAC POLYVALENT 25 MCG/0.5ML IJ INJ
0.5000 mL | INJECTION | INTRAMUSCULAR | Status: AC
  Administered 2015-01-23: 0.5 mL via INTRAMUSCULAR
  Filled 2015-01-22: qty 0.5

## 2015-01-22 MED ORDER — ADULT MULTIVITAMIN W/MINERALS CH
1.0000 | ORAL_TABLET | Freq: Every day | ORAL | Status: DC
  Administered 2015-01-22 – 2015-01-24 (×3): 1 via ORAL
  Filled 2015-01-22 (×3): qty 1

## 2015-01-22 MED ORDER — FERROUS SULFATE 300 (60 FE) MG/5ML PO SYRP
300.0000 mg | ORAL_SOLUTION | Freq: Two times a day (BID) | ORAL | Status: DC
  Administered 2015-01-22 – 2015-01-24 (×4): 300 mg via ORAL
  Filled 2015-01-22 (×3): qty 5

## 2015-01-22 NOTE — ED Notes (Signed)
Pt had tarry stools.

## 2015-01-22 NOTE — Consult Note (Signed)
Primary cardiologist: Dr Royann Shivers Consulting cardiologist: Dr Wyline Mood  Clinical Summary [name] is a 69 y.o.female SNF resident with DM2, demendita, chronic diastolic HF, permanent pacemaker,PAD s/p right AKI admitted, carotids stenosis with prior CEA, admitted with cough, wheezing, SOB and chest pain.   She reports wheezing, cough and SOB started yesterday and progressed into the night. She later developed a nonspecific left sided chest pain that she describes was worth with coughing. Notes some LE edema as well recently.    ER vitals: 165/58 88% RA No documented weights Echo 10/2013: LVEF 50-55%, no WMAs, abnormal diastolic function grade not described Trop neg, Cr 1.02, BUN 29, K 4.3,BNP 203 CXR pulm edema EKG atrial paced   Allergies  Allergen Reactions  . Iohexol Other (See Comments)     Desc: pt allergic to ivp dye/iodine, unsure of reaction   . Latex Other (See Comments)    Unknown     Medications Scheduled Medications: . acidophilus  2 capsule Oral Daily  . amLODipine  10 mg Oral Daily  . aspirin EC  81 mg Oral Daily  . chlorhexidine  10 mL Mouth/Throat QID  . clopidogrel  75 mg Oral Daily  . dextromethorphan-guaiFENesin  1 tablet Oral BID  . doxycycline  100 mg Oral Q12H  . escitalopram  20 mg Oral Daily  . feeding supplement (PRO-STAT SUGAR FREE 64)  30 mL Oral BID  . ferrous sulfate  300 mg Oral BID WC  . fluticasone  2 spray Each Nare Daily  . furosemide  40 mg Intravenous Daily  . gabapentin  200 mg Oral BID  . heparin  5,000 Units Subcutaneous 3 times per day  . hydrALAZINE  50 mg Oral QID  . insulin aspart  0-9 Units Subcutaneous TID WC  . ipratropium-albuterol  3 mL Nebulization Q4H  . labetalol  150 mg Oral 3 times per day  . methylPREDNISolone (SOLU-MEDROL) injection  60 mg Intravenous Q12H  . multivitamin with minerals  1 tablet Oral Daily  . pantoprazole  40 mg Oral Daily  . pravastatin  80 mg Oral QHS  . pregabalin  50 mg Oral TID  .  senna-docusate  2 tablet Oral BID  . sodium chloride  3 mL Intravenous Q12H  . vitamin C  500 mg Oral TID  . [START ON 02/02/2015] Vitamin D (Ergocalciferol)  50,000 Units Oral Q30 days     Infusions: . sodium chloride       PRN Medications:  sodium chloride, acetaminophen, albuterol, hydrALAZINE, loperamide, LORazepam, ondansetron (ZOFRAN) IV, oxyCODONE-acetaminophen, phenol, promethazine, sodium chloride   Past Medical History  Diagnosis Date  . Diabetes mellitus   . Hypertension   . Stroke (HCC)   . Hyperlipidemia   . Anemia   . CAD (coronary artery disease)   . Arthritis   . Ulcer   . Hypernatremia   . Hypokalemia   . Carotid artery occlusion   . Second degree AV block, Mobitz type II 06/01/2014  . Complete heart block, transient (HCC) 06/01/2014  . Pacemaker 06/01/2014    St Jude Accent DR 2012   . COPD (chronic obstructive pulmonary disease) [facility])     Past Surgical History  Procedure Laterality Date  . Above knee leg amputation      RIGHT AKA  . Carotid endarterectomy    . Peripheral arterial stent graft  03/16/2002    PTA & stenting of the left iliac artery  . Cardiac catheterization  04/10/2002    Normal  .  Carotid artery angioplasty  02/22/2009    PTA & stenting Choice Protocol  . Permanent pacemaker insertion  08/08/2010    St.Jude  . Nm myocar perf wall motion  04/07/2009    normal    Family History  Problem Relation Age of Onset  . Diabetes Mother   . Stroke Mother   . Cancer Father     colon  . Diabetes Sister   . Coronary artery disease Sister   . Diabetes Brother   . Coronary artery disease Brother     Social History [name] reports that she quit smoking about 4 years ago. Her smoking use included Cigarettes. She has a 41 pack-year smoking history. She does not have any smokeless tobacco history on file. [name] reports that she does not drink alcohol.  Review of Systems CONSTITUTIONAL: No weight loss, fever, chills, weakness  or fatigue.  HEENT: Eyes: No visual loss, blurred vision, double vision or yellow sclerae. No hearing loss, sneezing, congestion, runny nose or sore throat.  SKIN: No rash or itching.  CARDIOVASCULAR: per HPI RESPIRATORY: per HPI GASTROINTESTINAL: No anorexia, nausea, vomiting or diarrhea. No abdominal pain or blood.  GENITOURINARY: no polyuria, no dysuria NEUROLOGICAL: No headache, dizziness, syncope, paralysis, ataxia, numbness or tingling in the extremities. No change in bowel or bladder control.  MUSCULOSKELETAL: No muscle, back pain, joint pain or stiffness.  HEMATOLOGIC: No anemia, bleeding or bruising.  LYMPHATICS: No enlarged nodes. No history of splenectomy.  PSYCHIATRIC: No history of depression or anxiety.      Physical Examination Blood pressure 160/85, pulse 60, temperature 98 F (36.7 C), temperature source Oral, resp. rate 23, SpO2 94 %. No intake or output data in the 24 hours ending 01/22/15 1432  HEENT: sclera clear  Cardiovascular: RRR, no m/r/g, +JVD  Respiratory: bilateral expiratory wheezing  GI: abdomen soft, NT, nD  MSK: 1+ LLE edema  Neuro: no focal deficits  Psych: appropriate affect   Lab Results  Basic Metabolic Panel:  Recent Labs Lab 01/21/15 2110 01/21/15 2139 01/22/15 0728  NA 138 140 144  K 5.8* 5.7* 4.3  CL 107 110 110  CO2 23  --  24  GLUCOSE 157* 158* 142*  BUN 30* 34* 29*  CREATININE 1.06* 1.10* 1.02*  CALCIUM 8.6*  --  8.9  MG  --   --  2.7*    Liver Function Tests:  Recent Labs Lab 01/21/15 2110  AST 21  ALT 11*  ALKPHOS 90  BILITOT 0.1*  PROT 6.2*  ALBUMIN 3.1*    CBC:  Recent Labs Lab 01/21/15 2110 01/21/15 2139  WBC 11.4*  --   NEUTROABS 10.0*  --   HGB 10.3* 11.2*  HCT 33.8* 33.0*  MCV 89.4  --   PLT 237  --     Cardiac Enzymes:  Recent Labs Lab 01/22/15 0029 01/22/15 0728 01/22/15 1159  TROPONINI 0.04* <0.03 <0.03    BNP: Invalid input(s):  POCBNP     Impression/Recommendations 1. SOB - multifactorial including COPD exacerbation and acute on chronic diastolic HF - would start lasix  IV bid for today.   2. Atypical chest pain - she describes to me left sided symptoms worst with coughing - no evidence of ACS, isolated trop 0.04 is not significant. Follow symptoms as her COPD and CHF improve. Do not see indication for ischemic testing at this time. F/u repeat echo.     Dina Rich, M.D.

## 2015-01-22 NOTE — ED Notes (Signed)
Heart Healthy diet ordered.  

## 2015-01-22 NOTE — Evaluation (Signed)
Physical Therapy Evaluation Patient Details Name: [name] MRN: [id number] DOB: 19-Oct-1945 Today's Date: 01/22/2015   History of Present Illness  69 y.o. Female, SNF resident, with PMH of HTN, HLD, DM with vascular disease and neuropathy, GERD, depression, dementia, chronic diastolic CHF, PAD status post right AKA. Admitted for acute on chronic respiratory failure with hypoxia, Acute on chronic diastolic CHF, and COPD exacerbation.  Clinical Impression  Pt admitted with the above complications. Pt currently with functional limitations due to the deficits listed below (see PT Problem List). Per daughter's report, pt fairly immobile at baseline but has been improving significantly in the past 3 months to the point that [name] can tolerate sitting in a chair for up to 2 hours (hoyer lift for transfer) and is now feeding herself. Today, [name] was able to sit EOB x10 minutes, demonstrating good truncal control and ability to shift weight to and from midline without assist. Would favor pt receiving PT as SNF to improve her functional independence due to this recent improvement in abilities.Pt will benefit from skilled PT to increase their independence and safety with mobility to allow discharge to the venue listed below.       Follow Up Recommendations SNF    Equipment Recommendations  None recommended by PT    Recommendations for Other Services       Precautions / Restrictions Precautions Precautions: Fall Precaution Comments: monitor O2 Restrictions Weight Bearing Restrictions: No      Mobility  Bed Mobility Overal bed mobility: Needs Assistance Bed Mobility: Rolling;Sidelying to Sit;Sit to Sidelying Rolling: Mod assist Sidelying to sit: Max assist     Sit to sidelying: Max assist General bed mobility comments: Pt able to pull with UEs on rail, needed assist for LLE and trunk to rise and return to sidelying. Good effort with pushing through Lt shoulder when  pressing up to seated position.  Transfers                    Ambulation/Gait                Stairs            Wheelchair Mobility    Modified Rankin (Stroke Patients Only)       Balance Overall balance assessment: Needs assistance Sitting-balance support: No upper extremity supported;Feet supported Sitting balance-Leahy Scale: Fair Sitting balance - Comments: Sat EOB with intermittent assist for balance. Primarily using single UE for support but able to sit for several seconds at a time un supported. Tolerated 10 min at EOB focusing on reaching to the side and anterior towards limits of stability. Strong ability to return to midline although obviously effortful with SpO2 in mid 90s on 3.5L supplemental O2.                                     Pertinent Vitals/Pain Pain Assessment: No/denies pain    Home Living Family/patient expects to be discharged to:: Skilled nursing facility ([facility])                 Additional Comments: Daughter reports patient has not walked in 5 years, and no physical therapy in 2 years. She reports pt is primarily bed bout and staff use hoyer lift 4x/week to place pt in chair    Prior Function Level of Independence: Needs assistance   Gait / Transfers Assistance Needed: non-ambulatory  ADL's /  Homemaking Assistance Needed: Total assist for ADLs but can feed self  Comments: Dependent at baseline per daughter until about 3 months ago when pt began speaking again and able to feed herself.     Hand Dominance   Dominant Hand: Right    Extremity/Trunk Assessment   Upper Extremity Assessment: Defer to OT evaluation           Lower Extremity Assessment: RLE deficits/detail;LLE deficits/detail RLE Deficits / Details: Hx of Rt AKA LLE Deficits / Details: Very restricted ankle ROM, does not reach neutral dorsiflexion.     Communication   Communication: No difficulties  Cognition Arousal/Alertness:  Awake/alert Behavior During Therapy: Flat affect Overall Cognitive Status: History of cognitive impairments - at baseline                      General Comments General comments (skin integrity, edema, etc.): Daughter present. Very surprised by her mother's ability to sit on EOB unsupported.    Exercises General Exercises - Upper Extremity Shoulder Flexion: AROM;Both;Supine;5 reps;Strengthening General Exercises - Lower Extremity Ankle Circles/Pumps: Left;AAROM;10 reps;Supine Long Arc Quad: Strengthening;Left;10 reps;Seated      Assessment/Plan    PT Assessment Patient needs continued PT services  PT Diagnosis Difficulty walking;Generalized weakness   PT Problem List Decreased strength;Decreased activity tolerance;Decreased balance;Decreased mobility;Decreased cognition;Cardiopulmonary status limiting activity;Obesity;Decreased range of motion  PT Treatment Interventions DME instruction;Functional mobility training;Therapeutic activities;Therapeutic exercise;Balance training;Neuromuscular re-education;Patient/family education;Wheelchair mobility training   PT Goals (Current goals can be found in the Care Plan section) Acute Rehab PT Goals Patient Stated Goal: Start exercising again PT Goal Formulation: With patient/family Time For Goal Achievement: 04/02/15 Potential to Achieve Goals: Fair    Frequency Min 2X/week   Barriers to discharge        Co-evaluation               End of Session Equipment Utilized During Treatment: Oxygen Activity Tolerance: Patient limited by fatigue Patient left: in bed;with call bell/phone within reach;with family/visitor present Nurse Communication: Mobility status;Need for lift equipment         Time: 1610-9604 PT Time Calculation (min) (ACUTE ONLY): 22 min   Charges:   PT Evaluation $Initial PT Evaluation Tier I: 1 Procedure     PT G CodesBerton Mount 01/22/2015, 5:58 PM  [facility] [place],  [place] [phone]

## 2015-01-22 NOTE — Progress Notes (Signed)
PROGRESS NOTE    [name] ZOX:[id number] DOB: Feb 06, 1946 DOA: 01/21/2015 PCP: Georgann Housekeeper, MD  HPI/Brief narrative 69 y.o. 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 She had sharp midsternal nonradiating chest pain which resolved after sublingual NTG. She then became dyspneic and hypoxic at 74%-placed on nonrebreather mask and transiently on BiPAP. No chest pain on arrival to ED. In ED, patient was found to have pulmonary edema, negative troponin, temperature normal, bradycardia, potassium of 5.8 without EKG changes, WBC 11.4. Patient was admitted to inpatient for further eval and treatment.  Assessment/Plan:  Acute on chronic respiratory failure with hypoxia (HCC) -  Likely secondary to COPD exacerbation and decompensated CHF  - Briefly on nonrebreather mask and BiPAP  - Treating above conditions and clinically improved. Now breathing comfortably on nasal cannula  - Although PE on differentials, given her response to NTG and current regimen for CHF and COPD, felt less likely.  Acute on chronic diastolic CHF - 2-D echo on 11/13/13 showed EF of 50-55%.  - Diuresed with IV Lasix 40 mg every 12 hours - Repeat 2-D echo - Monitor closely  COPD exacerbation (HCC) - Treat with IV Solu-Medrol, oxygen, bronchodilators and a short course of oral doxycycline - Improving.  Atypical Chest pain/CAD - Resolved after dose of  sublingual nitroglycerin on route to ED. - Had 1 troponin mildly elevated at 0.04 but subsequent troponins 2 negative. - Heart Score: at least 7. Multiple significant CAD risk factors. Cardiology consulted for assistance - insignificant CAD by coronary angiography 2003 & normal nuclear myocardial perfusion in 2010, - 2d echo. - ASA, lipitor, labetalol  DM-II - Last A1c 5.3 on 09/28/10, well controled. Patient is taking NovoLog at home -SSI  HLD - Last LDL was 59 on 09/28/10 -Continue home medications: Pravastatin  Essential hypertension: - continue oral amlodipine, hydralazine, labetalol - IV hydralazine when necessary - ED started nitroglycerin patch - Mildly uncontrolled.  carotid artery stenosis: -ASA  Hyperkalemia: Potassium 5.8, no EKG changes. -On Lasix iv -Kayexalate 30 g 1 - Resolved.   Tobacco abuse - Cessation counseled.  PAD, status post right AKA - Tobacco cessation  GERD  Anemia, chronic disease - Stable    DVT ppx: SQ Heparin  Code Status: Full code Family Communication:None at bedside.  Disposition Plan: DC to SNF when medically stable.   Consultants:  Cardiology  Procedures:  BiPAP  Antibiotics:  Doxycycline 10/7 >   Subjective: States that she feels better. Indicates that her dyspnea is almost back to baseline. No further chest pains reported. Denies cough.  Objective: Filed Vitals:   01/22/15 1300 01/22/15 1315 01/22/15 1400 01/22/15 1415  BP: 176/60 182/98 124/104 160/85  Pulse: 63 63 59 60  Temp:      TempSrc:      Resp: SpO2: 100% 99% 96% 94%   No intake or output data in the 24 hours ending 01/22/15 1509 There were no vitals filed for this visit.  telemetry  25F.   Exam:  General exam: moderately built and nourished pleasant elderly female sitting propped up on the gurney in ED this morning eating breakfast. Appeared comfortable. Respiratory system: reduced breath sounds bilaterally. Occasional basal  crackles but no wheezing or rhonchi. No increased work of breathing. Cardiovascular system: S1 & S2 heard, RRR. No JVD, murmurs, gallops, clicks. Trace left leg edema. Gastrointestinal system: Abdomen is nondistended, soft and nontender. Normal bowel sounds heard. Central nervous system: Alert and oriented. No focal neurological deficits. Extremities: Symmetric 5 x 5 power.Right AKA stump healed.    Data Reviewed: Basic Metabolic Panel:  Recent Labs Lab 01/21/15 2110 01/21/15 2139 01/22/15 0728  NA 138 140 144  K 5.8* 5.7* 4.3  CL 107 110 110  CO2 23  --  24  GLUCOSE 157* 158* 142*  BUN 30* 34* 29*  CREATININE 1.06* 1.10* 1.02*  CALCIUM 8.6*  --  8.9  MG  --   --  2.7*   Liver Function Tests:  Recent Labs Lab 01/21/15 2110  AST 21  ALT 11*  ALKPHOS 90  BILITOT 0.1*  PROT 6.2*  ALBUMIN 3.1*   No results for input(s): LIPASE, AMYLASE in the last 168 hours. No results for input(s): AMMONIA in the last 168 hours. CBC:  Recent Labs Lab 01/21/15 2110 01/21/15 2139  WBC 11.4*  --   NEUTROABS 10.0*  --   HGB 10.3* 11.2*  HCT 33.8* 33.0*  MCV 89.4  --   PLT 237  --    Cardiac Enzymes:  Recent Labs Lab 01/22/15 0029 01/22/15 0728 01/22/15 1159  TROPONINI 0.04* <0.03 <0.03   BNP (last 3 results) No results for input(s): PROBNP in the last 8760 hours. CBG:  Recent Labs Lab 01/22/15 1008 01/22/15 1242  GLUCAP 140* 133*    No results found for this or any previous visit (from the past 240 hour(s)).       Studies: Dg Chest Port 1 View  01/21/2015   CLINICAL DATA:  Acute onset of shortness of breath and cough. Initial encounter.  EXAM: PORTABLE CHEST 1 VIEW  COMPARISON:  Chest radiograph performed 12/03/2013  FINDINGS: The lungs are hypoexpanded. Vascular congestion is noted, with increased interstitial markings, compatible with pulmonary edema. A small left pleural effusion is noted. No pneumothorax is seen.  The cardiomediastinal silhouette is  enlarged. A pacemaker is noted overlying the left chest wall, with leads ending overlying the right atrium and right ventricle. No acute osseous abnormalities are seen.  IMPRESSION: Lungs hypoexpanded. Vascular congestion and cardiomegaly, with increased interstitial markings, compatible with pulmonary edema. Small left pleural effusion noted.   Electronically Signed   By: Roanna Raider M.D.   On: 01/21/2015 21:37        Scheduled Meds: . acidophilus  2 capsule Oral Daily  . amLODipine  10 mg Oral Daily  . aspirin EC  81 mg Oral Daily  . chlorhexidine  10 mL Mouth/Throat QID  . clopidogrel  75 mg Oral Daily  . dextromethorphan-guaiFENesin  1 tablet Oral BID  . doxycycline  100 mg Oral Q12H  . escitalopram  20 mg Oral Daily  . feeding supplement (PRO-STAT SUGAR FREE 64)  30 mL Oral BID  . ferrous sulfate  300 mg Oral BID WC  . fluticasone  2 spray Each Nare Daily  . furosemide  40 mg Intravenous Daily  . gabapentin  200 mg Oral BID  . heparin  5,000 Units Subcutaneous 3 times per day  . hydrALAZINE  50 mg Oral QID  . insulin aspart  0-9 Units Subcutaneous TID WC  . ipratropium-albuterol  3 mL Nebulization Q4H  . labetalol  150 mg Oral 3 times per day  . methylPREDNISolone (SOLU-MEDROL) injection  60 mg Intravenous Q12H  . multivitamin with minerals  1 tablet Oral Daily  . pantoprazole  40 mg Oral Daily  . pravastatin  80 mg Oral QHS  . pregabalin  50 mg Oral TID  . senna-docusate  2 tablet Oral BID  . sodium chloride  3 mL Intravenous Q12H  . vitamin C  500 mg Oral TID  . [START ON 02/02/2015] Vitamin D (Ergocalciferol)  50,000 Units Oral Q30 days   Continuous Infusions: . sodium chloride      Principal Problem:   Acute on chronic respiratory failure with hypoxia (HCC) Active Problems:   Diabetes mellitus without complication (HCC)   HLD (hyperlipidemia)   GLAUCOMA NOS   Essential hypertension   Carotid artery stenosis   Dementia   Anemia   Second degree AV block,  Mobitz type II   Pacemaker   Acute on chronic diastolic (congestive) heart failure (HCC)   Hyperkalemia   COPD exacerbation (HCC)   Chest pain    Time spent: 30 minutes    HONGALGI,ANAND, MD, FACP, FHM. [facility] Pager [phone]  If 7PM-7AM, please contact night-coverage [email or web address] Password [email or web address] 01/22/2015, 3:09 PM    LOS: 1 day

## 2015-01-22 NOTE — Progress Notes (Signed)
  Echocardiogram 2D Echocardiogram has been performed.  [name] 01/22/2015, 4:44 PM

## 2015-01-22 NOTE — ED Notes (Signed)
IP bed was taken away rt staffing per bed control.

## 2015-01-23 DIAGNOSIS — F0390 Unspecified dementia without behavioral disturbance: Secondary | ICD-10-CM

## 2015-01-23 DIAGNOSIS — E876 Hypokalemia: Secondary | ICD-10-CM

## 2015-01-23 DIAGNOSIS — I110 Hypertensive heart disease with heart failure: Secondary | ICD-10-CM | POA: Diagnosis not present

## 2015-01-23 LAB — BASIC METABOLIC PANEL
ANION GAP: 10 (ref 5–15)
BUN: 46 mg/dL — ABNORMAL HIGH (ref 6–20)
CHLORIDE: 106 mmol/L (ref 101–111)
CO2: 24 mmol/L (ref 22–32)
Calcium: 8.5 mg/dL — ABNORMAL LOW (ref 8.9–10.3)
Creatinine, Ser: 1.04 mg/dL — ABNORMAL HIGH (ref 0.44–1.00)
GFR calc non Af Amer: 53 mL/min — ABNORMAL LOW (ref >60)
Glucose, Bld: 132 mg/dL — ABNORMAL HIGH (ref 65–99)
POTASSIUM: 3.4 mmol/L — AB (ref 3.5–5.1)
Sodium: 140 mmol/L (ref 135–145)

## 2015-01-23 LAB — GLUCOSE, CAPILLARY
GLUCOSE-CAPILLARY: 176 mg/dL — AB (ref 65–99)
Glucose-Capillary: 126 mg/dL — ABNORMAL HIGH (ref 65–99)
Glucose-Capillary: 143 mg/dL — ABNORMAL HIGH (ref 65–99)
Glucose-Capillary: 148 mg/dL — ABNORMAL HIGH (ref 65–99)

## 2015-01-23 MED ORDER — POTASSIUM CHLORIDE CRYS ER 20 MEQ PO TBCR
40.0000 meq | EXTENDED_RELEASE_TABLET | Freq: Once | ORAL | Status: AC
  Administered 2015-01-23: 40 meq via ORAL
  Filled 2015-01-23: qty 2

## 2015-01-23 MED ORDER — PREDNISONE 20 MG PO TABS
40.0000 mg | ORAL_TABLET | Freq: Every day | ORAL | Status: DC
  Administered 2015-01-23 – 2015-01-24 (×2): 40 mg via ORAL
  Filled 2015-01-23 (×2): qty 2

## 2015-01-23 NOTE — Progress Notes (Signed)
Patient ID: [name], female   DOB: 26-Oct-1945, 69 y.o.   MRN: [id number]     Subjective:    SOB has improved. No chest pain this AM  Objective:   Temp:  [98 F (36.7 C)-98.6 F (37 C)] 98.6 F (37 C) (10/09 0804) Pulse Rate:  [59-66] 65 (10/09 0804) Resp:  [18-30] 30 (10/09 0804) BP: (124-194)/(45-122) 159/95 mmHg (10/09 1117) SpO2:  [94 %-100 %] 100 % (10/09 0857) Weight:  [156 lb 11.2 oz (71.079 kg)] 156 lb 11.2 oz (71.079 kg) (10/08 1538) Last BM Date: 01/22/15  Filed Weights   01/22/15 1538  Weight: 156 lb 11.2 oz (71.079 kg)    Intake/Output Summary (Last 24 hours) at 01/23/15 1204 Last data filed at 01/23/15 1117  Gross per 24 hour  Intake    243 ml  Output      0 ml  Net    243 ml    Telemetry: NSR  Exam:  General: NAD  Resp: CTAB  Cardiac: RRR, no m/r/g, no jvd  GI: abdomen soft, NT, ND  MSK: no LE edema  Neuro: no focal deficits  Psych: appropriate affect  Lab Results:  Basic Metabolic Panel:  Recent Labs Lab 01/21/15 2110 01/21/15 2139 01/22/15 0728 01/23/15 0454  NA 138 140 144 140  K 5.8* 5.7* 4.3 3.4*  CL 107 110 110 106  CO2 23  --  24 24  GLUCOSE 157* 158* 142* 132*  BUN 30* 34* 29* 46*  CREATININE 1.06* 1.10* 1.02* 1.04*  CALCIUM 8.6*  --  8.9 8.5*  MG  --   --  2.7*  --     Liver Function Tests:  Recent Labs Lab 01/21/15 2110  AST 21  ALT 11*  ALKPHOS 90  BILITOT 0.1*  PROT 6.2*  ALBUMIN 3.1*    CBC:  Recent Labs Lab 01/21/15 2110 01/21/15 2139  WBC 11.4*  --   HGB 10.3* 11.2*  HCT 33.8* 33.0*  MCV 89.4  --   PLT 237  --     Cardiac Enzymes:  Recent Labs Lab 01/22/15 0029 01/22/15 0728 01/22/15 1159  TROPONINI 0.04* <0.03 <0.03    BNP: No results for input(s): PROBNP in the last 8760 hours.  Coagulation:  Recent Labs Lab 01/22/15 0728  INR 1.11    ECG:   Medications:   Scheduled Medications: . acidophilus  2 capsule Oral Daily  . amLODipine  10 mg Oral Daily  .  aspirin EC  81 mg Oral Daily  . chlorhexidine  10 mL Mouth/Throat QID  . clopidogrel  75 mg Oral Daily  . dextromethorphan-guaiFENesin  1 tablet Oral BID  . doxycycline  100 mg Oral Q12H  . escitalopram  20 mg Oral Daily  . feeding supplement (PRO-STAT SUGAR FREE 64)  30 mL Oral BID  . ferrous sulfate  300 mg Oral BID AC  . fluticasone  2 spray Each Nare Daily  . furosemide  40 mg Intravenous BID  . gabapentin  200 mg Oral BID  . heparin  5,000 Units Subcutaneous 3 times per day  . hydrALAZINE  50 mg Oral QID  . insulin aspart  0-9 Units Subcutaneous TID WC  . ipratropium-albuterol  3 mL Nebulization BID  . labetalol  150 mg Oral 3 times per day  . multivitamin with minerals  1 tablet Oral Daily  . pantoprazole  40 mg Oral Daily  . pravastatin  80 mg Oral QHS  . predniSONE  40 mg Oral  Q breakfast  . senna-docusate  2 tablet Oral BID  . sodium chloride  3 mL Intravenous Q12H  . vitamin C  500 mg Oral TID  . [START ON 02/02/2015] Vitamin D (Ergocalciferol)  50,000 Units Oral Q30 days     Infusions:     PRN Medications:  sodium chloride, acetaminophen, albuterol, hydrALAZINE, LORazepam, ondansetron (ZOFRAN) IV, oxyCODONE-acetaminophen, phenol, promethazine, sodium chloride     Assessment/Plan   1. SOB - multifactorial including COPD exacerbation and acute on chronic diastolic HF - I/Os not documented. She is on lasix IV  bid.  - Stable Cr, uptrend in BUN - would plan for IV lasix today then change to oral tomorrow.    2. Atypical chest pain - she describes to me left sided symptoms worst with coughing. Symptoms resolved this AM - no evidence of ACS, isolated trop 0.04 is not significant.  - echo with stable normal LVEF, no WMAs - no plans for ischemic testing at this time.   3. Pericardial effusion - chronic large effusion, no evidence of tamponade - would continue to follow serially, repeat echo in 6-8 weeks.       Dina Rich, M.D.

## 2015-01-23 NOTE — Progress Notes (Signed)
PROGRESS NOTE    [name] ZOX:[id number] DOB: 08-13-1945 DOA: 01/21/2015 PCP: Georgann Housekeeper, MD  HPI/Brief narrative 69 y.o. 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 She had sharp midsternal nonradiating chest pain which resolved after sublingual NTG. She then became dyspneic and hypoxic at 74%-placed on nonrebreather mask and transiently on BiPAP. No chest pain on arrival to ED. In ED, patient was found to have pulmonary edema, negative troponin, temperature normal, bradycardia, potassium of 5.8 without EKG changes, WBC 11.4. Patient was admitted to inpatient for further eval and treatment.  Assessment/Plan:  Acute on chronic respiratory failure with hypoxia (HCC) -  Likely secondary to COPD exacerbation and decompensated CHF(main factor) - Briefly on nonrebreather mask and BiPAP  - Although PE on differentials, given her response to NTG and current regimen for CHF and COPD, felt less likely. - Improved. Wean off of O2 as tolerated to maintain sats > 90  Acute on chronic diastolic CHF - 2-D echo on 11/13/13 showed EF of 50-55%.  - Diuresed with IV Lasix 40 mg every 12 hours - Repeat 2-D echo - Improving. Incontinent: No I/O's. Continue additional 24 hours of IV Lasix, then change to PO at DC.  COPD exacerbation (HCC) - Treated with IV Solu-Medrol, oxygen, bronchodilators and a short course of oral doxycycline -  Improved. Change steroids to PO with rapid taper.  Atypical Chest pain/CAD - Resolved after dose of sublingual nitroglycerin on route to ED. - Had 1 troponin mildly elevated at 0.04 but subsequent troponins 2 negative. - Heart Score: at least 7. Multiple significant CAD risk factors. Cardiology consulted for assistance - insignificant CAD by coronary angiography 2003 & normal nuclear myocardial perfusion in 2010, - 2d echo. - ASA, lipitor, labetalol - DC NTP.  - No further CP.  DM-II - Last A1c 5.3 on 09/28/10, well controled. Patient is taking NovoLog at home -SSI. Controlled  HLD - Last LDL was 59 on 09/28/10 -Continue home medications: Pravastatin  Essential hypertension: - continue oral amlodipine, hydralazine, labetalol - IV hydralazine when necessary - Mildly uncontrolled/fluctuating.  carotid artery stenosis: -ASA  Hyperkalemia, on admission/hypokalemia  - Hyperkalemia resolved. Now low-replace and follow  Tobacco abuse - Cessation counseled.  PAD, status post right AKA - Tobacco cessation  GERD  Anemia, chronic disease - Stable    DVT ppx: SQ Heparin  Code Status: Full code Family Communication:None at bedside.  Disposition Plan: DC to SNF hopefully 10/10  Consultants:  Cardiology  Procedures:  BiPAP  Antibiotics:  Doxycycline 10/7 >   Subjective: No CP. SOB better but still not at baseline. SOB while talking  Objective: Filed Vitals:   01/22/15 2000 01/22/15 2127 01/22/15 2300 01/23/15 0400  BP: 132/101  146/122 153/82  Pulse: 63  65 64  Temp: 98.4 F (36.9 C)  98 F (36.7 C) 98.2 F (36.8 C)  TempSrc:      Resp: 27  19 24   Height:      Weight:  SpO2: 98% 98% 96% 97%   No intake or output data in the 24 hours ending 01/23/15 0749 Filed Weights   01/22/15 1538  Weight: 71.079 kg (156 lb 11.2 oz)    telemetry 80F.   Exam:  General exam: moderately built and nourished pleasant elderly female Lying comfortably supine  in bed. Respiratory system: Improved BS's b/l. Occasional basal crackles but no wheezing or rhonchi. No increased work of breathing. Cardiovascular system: S1 & S2 heard, RRR. No JVD, murmurs, gallops, clicks. Trace left leg edema. Tele: SR. On demand APaced Gastrointestinal system: Abdomen is nondistended, soft and nontender. Normal bowel sounds heard. Central nervous system: Alert and oriented. No focal neurological deficits. Extremities: Symmetric 5 x 5 power.Right AKA stump healed.    Data Reviewed: Basic Metabolic Panel:  Recent Labs Lab 01/21/15 2110 01/21/15 2139 01/22/15 0728 01/23/15 0454  NA 138 140 144 140  K 5.8* 5.7* 4.3 3.4*  CL 107 110 110 106  CO2 23  --  24 24  GLUCOSE 157* 158* 142* 132*  BUN 30* 34* 29* 46*  CREATININE 1.06* 1.10* 1.02* 1.04*  CALCIUM 8.6*  --  8.9 8.5*  MG  --   --  2.7*  --    Liver Function Tests:  Recent Labs Lab 01/21/15 2110  AST 21  ALT 11*  ALKPHOS 90  BILITOT 0.1*  PROT 6.2*  ALBUMIN 3.1*   No results for input(s): LIPASE, AMYLASE in the last 168 hours. No results for input(s): AMMONIA in the last 168 hours. CBC:  Recent Labs Lab 01/21/15 2110 01/21/15 2139  WBC 11.4*  --   NEUTROABS 10.0*  --   HGB 10.3* 11.2*  HCT 33.8* 33.0*  MCV 89.4  --   PLT 237  --    Cardiac Enzymes:  Recent Labs Lab 01/22/15 0029 01/22/15 0728 01/22/15 1159  TROPONINI 0.04* <0.03 <0.03   BNP (last 3 results) No results for input(s): PROBNP in the last 8760 hours. CBG:  Recent Labs Lab 01/22/15 1008 01/22/15 1242 01/22/15 1619 01/22/15 2115  GLUCAP 140* 133* 148* 124*    Recent Results (from the past 240 hour(s))  MRSA PCR Screening     Status: Abnormal   Collection Time: 01/22/15  5:10 PM  Result Value Ref Range Status   MRSA by PCR POSITIVE (A) NEGATIVE Final    Comment:        The GeneXpert MRSA Assay (FDA approved for NASAL specimens only), is one component of a comprehensive MRSA colonization surveillance  program. It is not intended to diagnose MRSA infection nor to guide or monitor treatment for MRSA infections. RESULT CALLED TO, READ BACK BY AND VERIFIED WITH: DIALLO,F RN 01/22/15 2015 WOOTEN,K          Studies: Dg Chest Port 1 View  01/21/2015   CLINICAL DATA:  Acute onset of shortness of breath and cough. Initial encounter.  EXAM: PORTABLE CHEST 1 VIEW  COMPARISON:  Chest radiograph performed 12/03/2013  FINDINGS: The lungs are hypoexpanded. Vascular congestion is noted, with increased interstitial markings, compatible with pulmonary edema. A small left pleural effusion is noted. No pneumothorax is seen.  The cardiomediastinal silhouette is enlarged. A pacemaker is noted overlying the left chest wall, with leads ending overlying the right atrium and right ventricle. No acute osseous abnormalities are seen.  IMPRESSION: Lungs hypoexpanded. Vascular congestion and cardiomegaly, with increased interstitial markings, compatible with pulmonary edema. Small left pleural effusion noted.   Electronically Signed   By: Roanna Raider  M.D.   On: 01/21/2015 21:37        Scheduled Meds: . acidophilus  2 capsule Oral Daily  . amLODipine  10 mg Oral Daily  . aspirin EC  81 mg Oral Daily  . chlorhexidine  10 mL Mouth/Throat QID  . clopidogrel  75 mg Oral Daily  . dextromethorphan-guaiFENesin  1 tablet Oral BID  . doxycycline  100 mg Oral Q12H  . escitalopram  20 mg Oral Daily  . feeding supplement (PRO-STAT SUGAR FREE 64)  30 mL Oral BID  . ferrous sulfate  300 mg Oral BID AC  . fluticasone  2 spray Each Nare Daily  . furosemide  40 mg Intravenous BID  . gabapentin  200 mg Oral BID  . heparin  5,000 Units Subcutaneous 3 times per day  . hydrALAZINE  50 mg Oral QID  . insulin aspart  0-9 Units Subcutaneous TID WC  . ipratropium-albuterol  3 mL Nebulization BID  . labetalol  150 mg Oral 3 times per day  . methylPREDNISolone (SOLU-MEDROL) injection  60 mg Intravenous Q12H  . multivitamin  with minerals  1 tablet Oral Daily  . pantoprazole  40 mg Oral Daily  . pneumococcal 23 valent vaccine  0.5 mL Intramuscular Tomorrow-1000  . potassium chloride  40 mEq Oral Once  . pravastatin  80 mg Oral QHS  . senna-docusate  2 tablet Oral BID  . sodium chloride  3 mL Intravenous Q12H  . vitamin C  500 mg Oral TID  . [START ON 02/02/2015] Vitamin D (Ergocalciferol)  50,000 Units Oral Q30 days   Continuous Infusions:    Principal Problem:   Acute on chronic respiratory failure with hypoxia (HCC) Active Problems:   Diabetes mellitus without complication (HCC)   HLD (hyperlipidemia)   GLAUCOMA NOS   Essential hypertension   Carotid artery stenosis   Dementia   Anemia   Second degree AV block, Mobitz type II   Pacemaker   Acute on chronic diastolic (congestive) heart failure (HCC)   Hyperkalemia   COPD exacerbation (HCC)   Chest pain   Acute on chronic diastolic heart failure (HCC)   Atypical chest pain    Time spent: 30 minutes    Augustus Zurawski, MD, FACP, FHM. [facility] Pager [phone]  If 7PM-7AM, please contact night-coverage [email or web address] Password [email or web address] 01/23/2015, 7:49 AM    LOS: 2 days

## 2015-01-23 NOTE — Progress Notes (Signed)
Utilization Review Completed.[name] T10/12/2014  

## 2015-01-24 DIAGNOSIS — Z950 Presence of cardiac pacemaker: Secondary | ICD-10-CM

## 2015-01-24 LAB — BASIC METABOLIC PANEL
Anion gap: 9 (ref 5–15)
BUN: 62 mg/dL — AB (ref 6–20)
CALCIUM: 8.5 mg/dL — AB (ref 8.9–10.3)
CO2: 24 mmol/L (ref 22–32)
Chloride: 109 mmol/L (ref 101–111)
Creatinine, Ser: 1.16 mg/dL — ABNORMAL HIGH (ref 0.44–1.00)
GFR calc Af Amer: 54 mL/min — ABNORMAL LOW (ref >60)
GFR, EST NON AFRICAN AMERICAN: 46 mL/min — AB (ref >60)
GLUCOSE: 114 mg/dL — AB (ref 65–99)
Potassium: 3.6 mmol/L (ref 3.5–5.1)
Sodium: 142 mmol/L (ref 135–145)

## 2015-01-24 LAB — GLUCOSE, CAPILLARY
GLUCOSE-CAPILLARY: 116 mg/dL — AB (ref 65–99)
Glucose-Capillary: 85 mg/dL (ref 65–99)

## 2015-01-24 MED ORDER — PREDNISONE 10 MG PO TABS: ORAL_TABLET | ORAL | Status: AC

## 2015-01-24 MED ORDER — DOXYCYCLINE HYCLATE 100 MG PO TABS: 100.0000 mg | ORAL_TABLET | Freq: Two times a day (BID) | ORAL | Status: AC

## 2015-01-24 MED ORDER — OXYCODONE-ACETAMINOPHEN 5-325 MG PO TABS: 1.0000 | ORAL_TABLET | Freq: Three times a day (TID) | ORAL | Status: AC | PRN

## 2015-01-24 MED ORDER — IPRATROPIUM-ALBUTEROL 0.5-2.5 (3) MG/3ML IN SOLN: 3.0000 mL | Freq: Two times a day (BID) | RESPIRATORY_TRACT | Status: AC

## 2015-01-24 MED ORDER — FUROSEMIDE 20 MG PO TABS: 40.0000 mg | ORAL_TABLET | Freq: Every day | ORAL | Status: AC

## 2015-01-24 MED ORDER — MUPIROCIN 2 % EX OINT: 1.0000 "application " | TOPICAL_OINTMENT | Freq: Two times a day (BID) | CUTANEOUS | Status: DC

## 2015-01-24 MED ORDER — FUROSEMIDE 40 MG PO TABS
40.0000 mg | ORAL_TABLET | Freq: Two times a day (BID) | ORAL | Status: DC
  Administered 2015-01-24: 40 mg via ORAL
  Filled 2015-01-24: qty 1

## 2015-01-24 MED ORDER — POTASSIUM CHLORIDE ER 10 MEQ PO TBCR
20.0000 meq | EXTENDED_RELEASE_TABLET | Freq: Every day | ORAL | Status: DC
  Administered 2015-01-24: 20 meq via ORAL
  Filled 2015-01-24 (×2): qty 2

## 2015-01-24 MED ORDER — ALBUTEROL SULFATE (2.5 MG/3ML) 0.083% IN NEBU: 2.5000 mg | INHALATION_SOLUTION | Freq: Four times a day (QID) | RESPIRATORY_TRACT | Status: AC | PRN

## 2015-01-24 MED ORDER — ACETAMINOPHEN 325 MG PO TABS: 650.0000 mg | ORAL_TABLET | Freq: Four times a day (QID) | ORAL | Status: AC | PRN

## 2015-01-24 MED ORDER — LORAZEPAM 0.5 MG PO TABS: 0.5000 mg | ORAL_TABLET | Freq: Two times a day (BID) | ORAL | Status: AC | PRN

## 2015-01-24 MED ORDER — CHLORHEXIDINE GLUCONATE CLOTH 2 % EX PADS
6.0000 | MEDICATED_PAD | Freq: Every day | CUTANEOUS | Status: DC
  Administered 2015-01-24: 6 via TOPICAL

## 2015-01-24 NOTE — Care Management Important Message (Signed)
Important Message  Patient Details  Name: [name] MRN: [id number] Date of Birth: [birth year]   Medicare Important Message Given:  Yes-second notification given    Elliot Cousin, RN 01/24/2015, 9:37 AM

## 2015-01-24 NOTE — Care Management Note (Signed)
Case Management Note  Patient Details  Name: [name] MRN: [id number] Date of Birth: 1945/04/26  Subjective/Objective:     respiratory failure with hypoxia                Action/Plan:  SNF- NCM spoke to pt and son, Fayrene Fearing. Scheduled dc back to [facility] SNF. No NCM needs identified.    Expected Discharge Date:  01/24/2015             Expected Discharge Plan:  Skilled Nursing Facility  In-House Referral:  Clinical Social Work  Discharge planning Services  CM Consult   Status of Service:  Completed, signed off  Medicare Important Message Given:  Yes-second notification given Date Medicare IM Given:    Medicare IM give by:    Date Additional Medicare IM Given:    Additional Medicare Important Message give by:     If discussed at Long Length of Stay Meetings, dates discussed:    Additional Comments:  Elliot Cousin, RN 01/24/2015, 10:40 AM

## 2015-01-24 NOTE — Clinical Social Work Note (Signed)
Per MD, patient is ready to discharge back to [facility]. RN, Patient/family, and facility have been notified of patient discharge. RN given number for report. D/C packet on chart. Ambulance transport requested for patient. BSW Intern signing off.  Jenita Seashore BSW Intern, [phone]

## 2015-01-24 NOTE — Progress Notes (Signed)
OT Cancellation Note  Patient Details Name: [name] MRN: [id number] DOB: 1945-08-31   Cancelled Treatment:    Reason Eval/Treat Not Completed: Other (comment). Planning to d/c to SNF today. Will further all other OT needs to next venue of care.  Earlie Raveling OTR/L [phone] 01/24/2015, 11:33 AM

## 2015-01-24 NOTE — Discharge Summary (Signed)
Physician Discharge Summary  WINSLOW VERRILL ZOX:[id number] DOB: February 18, 1946 DOA: 01/21/2015  PCP: Georgann Housekeeper, MD  Admit date: 01/21/2015 Discharge date: 01/24/2015  Time spent: Greater than 30 minutes  Recommendations for Outpatient Follow-up:  1. M.D. at SNF in 3 days with repeat labs (CBC & BMP). 2. Repeat 2-D echo in 6-8 weeks to follow chronic large pericardial effusion. 3. Dr. Thurmon Fair, Cardiology: SNF to arrange appointment  Discharge Diagnoses:  Principal Problem:   Acute on chronic respiratory failure with hypoxia (HCC) Active Problems:   Diabetes mellitus without complication (HCC)   HLD (hyperlipidemia)   GLAUCOMA NOS   Essential hypertension   Carotid artery stenosis   Dementia   Anemia   Second degree AV block, Mobitz type II   Pacemaker   Acute on chronic diastolic (congestive) heart failure (HCC)   Hyperkalemia   COPD exacerbation (HCC)   Chest pain   Acute on chronic diastolic heart failure (HCC)   Atypical chest pain   Discharge Condition: Improved & Stable  Diet recommendation:  Heart healthy and diabetic diet.  Filed Weights   01/22/15 1538  Weight: 71.079 kg (156 lb 11.2 oz)    History of present illness:  69 y.o. Female, SNF resident, with PMH of HTN, HLD, DM with vascular disease and neuropathy, GERD, depression, dementia, chronic diastolic CHF, PAD status post right AKA, transient third-degree AV block and recurrent Mobitz type II second-degree AV block after her right AKA in 2012-status post dual-chamber pacemaker, extensive PAD with previous left CEA and then stent in 2010/PTA-STENT Left iliac artery, stroke, insignificant CAD by coronary angiography 2003 & normal nuclear myocardial perfusion in 2010, ongoing tobacco abuse, not on home O2, presented to [facility] ED on 01/21/15 with acute onset of chest pain and dyspnea. She had sharp midsternal nonradiating chest pain which resolved after sublingual NTG. She then became dyspneic and hypoxic at  74%-placed on nonrebreather mask and transiently on BiPAP. No chest pain on arrival to ED. In ED, patient was found to have pulmonary edema, negative troponin, temperature normal, bradycardia, potassium of 5.8 without EKG changes, WBC 11.4. Patient was admitted to inpatient for further eval and treatment.  Hospital Course:   Acute on chronic respiratory failure with hypoxia (HCC) -Likely secondary to COPD exacerbation and decompensated CHF(main factor) - Briefly on nonrebreather mask and BiPAP  - Although PE on differentials, given her response to NTG and current regimen for CHF and COPD, felt less likely. - Hypoxia seems to resolve. Oxygen saturations in the low 90s on room air at this time. Continue to monitor closely at SNF.  Acute on chronic diastolic CHF - 2-D echo on 11/13/13 showed EF of 50-55%. Repeat 2-D echo as below. - Diuresed with IV Lasix 40 mg every 12 hours and patient clinically improved. Cardiology follow-up appreciated. We'll transition to increased dose of oral Lasix today and needs to be closely monitored with repeat BMP at SNF.  COPD exacerbation (HCC) - Treated with IV Solu-Medrol, oxygen, bronchodilators and a short course of oral doxycycline - Improved. Change steroids to PO with rapid taper. Complete 5 days of oral doxycycline. Tobacco cessation counseled.  Atypical Chest pain/CAD - Resolved after dose of sublingual nitroglycerin on route to ED. - Had 1 troponin mildly elevated at 0.04 but subsequent troponins 2 negative. - Heart Score: at least 7. Multiple significant CAD risk factors. Cardiology consulted for assistance - insignificant CAD by coronary angiography 2003 & normal nuclear myocardial perfusion in 2010, - 2d echo. - ASA, lipitor, labetalol -  DC NTP.  - No further CP. - As per cardiology follow-up, no evidence of ACS, isolated troponin 0.04 is not significant, echo with stable normal LVEF and no wall motion abnormalities and no plans for ischemic  testing at this time.  Chronic large pericardial effusion - No evidence of tamponade. As per cardiology, repeat echo in 6-8 weeks and needs to be followed serially.  DM-II with peripheral neuropathy - Last A1c 5.3 on 09/28/10, well controled. Patient is taking NovoLog at home -SSI. Controlled - Patient was on dual medications for neuropathy i.e. gabapentin and Lyrica-discontinued Lyrica.  HLD - Last LDL was 59 on 09/28/10 -Continue home medications: Pravastatin  Essential hypertension: - continue oral amlodipine, hydralazine, labetalol - Mildly uncontrolled/fluctuating. Blood pressure recordings early this morning were likely erroneous. Repeat blood pressures in the 160/50 range.  carotid artery stenosis: -ASA  Hyperkalemia, on admission/hypokalemia  - Hyperkalemia resolved. Now low-replace and follow closely at SNF.  Tobacco abuse - Cessation counseled.  PAD, status post right AKA - Tobacco cessation. Continue antiplatelets.  GERD  Anemia, chronic disease - Stable    Consultants:  Cardiology  Procedures:  BiPAP  2-D echo 01/22/15: Study Conclusions  - Left ventricle: The cavity size was normal. Wall thickness was increased in a pattern of moderate LVH. Systolic function was normal. The estimated ejection fraction was in the range of 60% to 65%. Wall motion was normal; there were no regional wall motion abnormalities. Doppler parameters are consistent with abnormal left ventricular relaxation (grade 1 diastolic dysfunction). - Aortic valve: Mildly calcified annulus. Trileaflet; mildly thickened leaflets. Valve area (VTI): 2.42 cm^2. Valve area (Vmax): 1.79 cm^2. - Mitral valve: Mildly calcified annulus. Mildly thickened leaflets . - Left atrium: The atrium was moderately to severely dilated. - Atrial septum: No defect or patent foramen ovale was identified. - Inferior vena cava: The vessel was normal in size. The respirophasic diameter  changes were in the normal range (= 50%), consistent with normal central venous pressure. - Pericardium, extracardiac: There islarge circumferential pericardial effusion measuring 4.0 cm at its most prominent point in diastole adjacent to the LV. The effusion is mildly larger compared to 11/13/13 study. There is no RA or RV diastolic collapse. MV inflow respiratory variation is 10%. Normal IVC essentially excludes tamponade physiology.  Antibiotics:  Doxycycline 10/7 > 10/12   Discharge Exam:  Complaints:  Denies complaints. No dyspnea, cough or chest pain. As per nursing, no acute events.  Filed Vitals:   01/24/15 0845 01/24/15 0958 01/24/15 1001 01/24/15 1005  BP: 162/53  162/53 162/53  Pulse: 60     Temp: 98.4 F (36.9 C)     TempSrc: Oral     Resp: 20     Height:      Weight:      SpO2: 90% 94%      General exam: moderately built and nourished pleasant elderly female lying comfortably propped up in bed. Respiratory system: Clear to auscultation. No increased work of breathing. Cardiovascular system: S1 & S2 heard, RRR. No JVD, murmurs, gallops, clicks. Trace left leg edema. Tele: SR. On demand APaced Gastrointestinal system: Abdomen is nondistended, soft and nontender. Normal bowel sounds heard. Central nervous system: Alert and oriented. No focal neurological deficits. Extremities: Symmetric 5 x 5 power.Right AKA stump healed.   Discharge Instructions      Discharge Instructions    (HEART FAILURE PATIENTS) Call MD:  Anytime you have any of the following symptoms: 1) 3 pound weight gain in 24 hours or  5 pounds in 1 week 2) shortness of breath, with or without a dry hacking cough 3) swelling in the hands, feet or stomach 4) if you have to sleep on extra pillows at night in order to breathe.    Complete by:  As directed      Call MD for:  difficulty breathing, headache or visual disturbances    Complete by:  As directed      Call MD for:  extreme fatigue     Complete by:  As directed      Call MD for:  hives    Complete by:  As directed      Call MD for:  persistant dizziness or light-headedness    Complete by:  As directed      Call MD for:  persistant nausea and vomiting    Complete by:  As directed      Call MD for:  severe uncontrolled pain    Complete by:  As directed      Call MD for:  temperature >100.4    Complete by:  As directed      Diet - low sodium heart healthy    Complete by:  As directed      Diet Carb Modified    Complete by:  As directed      Increase activity slowly    Complete by:  As directed             Medication List    STOP taking these medications        pregabalin 50 MG capsule  Commonly known as:  LYRICA      TAKE these medications        acetaminophen 325 MG tablet  Commonly known as:  TYLENOL  Take 2 tablets (650 mg total) by mouth every 6 (six) hours as needed for mild pain, moderate pain, fever or headache (Acetaminophen dose from all sources not to exceed 3 g per 24 hours.).     acidophilus Caps capsule  Take 2 capsules by mouth daily.     albuterol (2.5 MG/3ML) 0.083% nebulizer solution  Commonly known as:  PROVENTIL  Take 3 mLs (2.5 mg total) by nebulization every 6 (six) hours as needed for wheezing or shortness of breath.     amLODipine 10 MG tablet  Commonly known as:  NORVASC  Take 1 tablet (10 mg total) by mouth daily.     aspirin EC 81 MG tablet  Take 81 mg by mouth daily.     chlorhexidine 0.12 % solution  Commonly known as:  PERIDEX  Use as directed 10 mLs in the mouth or throat 4 (four) times daily.     clopidogrel 75 MG tablet  Commonly known as:  PLAVIX  Take 75 mg by mouth daily.     doxycycline 100 MG tablet  Commonly known as:  VIBRA-TABS  Take 1 tablet (100 mg total) by mouth 2 (two) times daily. Discontinue after 01/26/15 doses.     escitalopram 20 MG tablet  Commonly known as:  LEXAPRO  Take 20 mg by mouth daily.     feeding supplement (PRO-STAT SUGAR  FREE 64) Liqd  Take 30 mLs by mouth 2 (two) times daily.     ferrous sulfate 220 (44 FE) MG/5ML solution  Take 330 mg by mouth 2 (two) times daily with a meal. 7.25mls dose Mix with juice     fluticasone 50 MCG/ACT nasal spray  Commonly known as:  FLONASE  Place  2 sprays into both nostrils daily.     furosemide 20 MG tablet  Commonly known as:  LASIX  Take 2 tablets (40 mg total) by mouth daily.     gabapentin 100 MG capsule  Commonly known as:  NEURONTIN  Take 200 mg by mouth 2 (two) times daily.     hydrALAZINE 50 MG tablet  Commonly known as:  APRESOLINE  Take 50 mg by mouth 4 (four) times daily.     insulin aspart 100 UNIT/ML injection  Commonly known as:  novoLOG  Inject 0-9 Units into the skin 3 (three) times daily before meals. As directed     ipratropium-albuterol 0.5-2.5 (3) MG/3ML Soln  Commonly known as:  DUONEB  Take 3 mLs by nebulization 2 (two) times daily.     labetalol 300 MG tablet  Commonly known as:  NORMODYNE  Take 150 mg by mouth every 8 (eight) hours.     loperamide 2 MG capsule  Commonly known as:  IMODIUM  Take 1 capsule (2 mg total) by mouth every 6 (six) hours as needed for diarrhea or loose stools.     LORazepam 0.5 MG tablet  Commonly known as:  ATIVAN  Take 1 tablet (0.5 mg total) by mouth every 12 (twelve) hours as needed for anxiety.     multivitamin tablet  Take 1 tablet by mouth daily.     OCUSOFT LID SCRUB Pads  Place 1 each into both eyes every morning.     omeprazole 20 MG capsule  Commonly known as:  PRILOSEC  Take 20 mg by mouth daily.     oxyCODONE-acetaminophen 5-325 MG tablet  Commonly known as:  PERCOCET/ROXICET  Take 1 tablet by mouth every 8 (eight) hours as needed for severe pain.     phenol 1.4 % Liqd  Commonly known as:  CHLORASEPTIC  Use as directed 2 sprays in the mouth or throat as needed for throat irritation / pain.     Potassium Chloride ER 20 MEQ Tbcr  Take 20 mEq by mouth daily.     pravastatin 80 MG  tablet  Commonly known as:  PRAVACHOL  Take 80 mg by mouth at bedtime.     predniSONE 10 MG tablet  Commonly known as:  DELTASONE  40 MG daily 2 days, then 30 MG daily 2 days, then 20 MG daily 2 days, then 10 MG daily 2 days, then stop.     promethazine 25 MG suppository  Commonly known as:  PHENERGAN  Place 25 mg rectally every 6 (six) hours as needed for nausea or vomiting.     SALONPAS-HOT 0.025 % Pads  Generic drug:  Capsaicin  Apply 1 patch topically every 12 (twelve) hours. Use for 12 hours, hen remove for 12 hours and repeat     sennosides-docusate sodium 8.6-50 MG tablet  Commonly known as:  SENOKOT-S  Take 2 tablets by mouth 2 (two) times daily.     vitamin C 500 MG tablet  Commonly known as:  ASCORBIC ACID  Take 500 mg by mouth 3 (three) times daily. Give with ferrous sulfate     Vitamin D3 50000 UNITS Caps  Take 1 capsule by mouth every 30 (thirty) days.          The results of significant diagnostics from this hospitalization (including imaging, microbiology, ancillary and laboratory) are listed below for reference.    Significant Diagnostic Studies: Dg Chest Port 1 View  01/21/2015   CLINICAL DATA:  Acute onset of shortness  of breath and cough. Initial encounter.  EXAM: PORTABLE CHEST 1 VIEW  COMPARISON:  Chest radiograph performed 12/03/2013  FINDINGS: The lungs are hypoexpanded. Vascular congestion is noted, with increased interstitial markings, compatible with pulmonary edema. A small left pleural effusion is noted. No pneumothorax is seen.  The cardiomediastinal silhouette is enlarged. A pacemaker is noted overlying the left chest wall, with leads ending overlying the right atrium and right ventricle. No acute osseous abnormalities are seen.  IMPRESSION: Lungs hypoexpanded. Vascular congestion and cardiomegaly, with increased interstitial markings, compatible with pulmonary edema. Small left pleural effusion noted.   Electronically Signed   By: Roanna Raider  M.D.   On: 01/21/2015 21:37    Microbiology: Recent Results (from the past 240 hour(s))  MRSA PCR Screening     Status: Abnormal   Collection Time: 01/22/15  5:10 PM  Result Value Ref Range Status   MRSA by PCR POSITIVE (A) NEGATIVE Final    Comment:        The GeneXpert MRSA Assay (FDA approved for NASAL specimens only), is one component of a comprehensive MRSA colonization surveillance program. It is not intended to diagnose MRSA infection nor to guide or monitor treatment for MRSA infections. RESULT CALLED TO, READ BACK BY AND VERIFIED WITH: DIALLO,F RN 01/22/15 2015 WOOTEN,K      Labs: Basic Metabolic Panel:  Recent Labs Lab 01/21/15 2110 01/21/15 2139 01/22/15 0728 01/23/15 0454 01/24/15 0337  NA 138 140 144 140 142  K 5.8* 5.7* 4.3 3.4* 3.6  CL 107 110 110 106 109  CO2 23  --  GLUCOSE 157* 158* 142* 132* 114*  BUN 30* 34* 29* 46* 62*  CREATININE 1.06* 1.10* 1.02* 1.04* 1.16*  CALCIUM 8.6*  --  8.9 8.5* 8.5*  MG  --   --  2.7*  --   --    Liver Function Tests:  Recent Labs Lab 01/21/15 2110  AST 21  ALT 11*  ALKPHOS 90  BILITOT 0.1*  PROT 6.2*  ALBUMIN 3.1*   No results for input(s): LIPASE, AMYLASE in the last 168 hours. No results for input(s): AMMONIA in the last 168 hours. CBC:  Recent Labs Lab 01/21/15 2110 01/21/15 2139  WBC 11.4*  --   NEUTROABS 10.0*  --   HGB 10.3* 11.2*  HCT 33.8* 33.0*  MCV 89.4  --   PLT 237  --    Cardiac Enzymes:  Recent Labs Lab 01/22/15 0029 01/22/15 0728 01/22/15 1159  TROPONINI 0.04* <0.03 <0.03   BNP: BNP (last 3 results)  Recent Labs  01/21/15 2322  BNP 203.7*    ProBNP (last 3 results) No results for input(s): PROBNP in the last 8760 hours.  CBG:  Recent Labs Lab 01/23/15 0743 01/23/15 1228 01/23/15 1710 01/23/15 2042 01/24/15 0729  GLUCAP 126* 143* 148* 176* 85        Signed:  Marcellus Scott, MD, FACP, FHM. [facility] Pager [phone]  If  7PM-7AM, please contact night-coverage [email or web address] Password [email or web address] 01/24/2015, 10:27 AM

## 2015-01-24 NOTE — Clinical Social Work Note (Signed)
Clinical Social Work Assessment  Patient Details  Name: [name] MRN: [id number] Date of Birth: [birth year]  Date of referral:  01/24/15               Reason for consult:  Discharge Planning, Facility Placement                Permission sought to share information with:  Family Supports, Oceanographer granted to share information::  Yes, Verbal Permission Granted  Name::    Agency::  [facility]  Relationship::     Contact Information:     Housing/Transportation Living arrangements for the past 2 months:  Skilled Building surveyor of Information:  Adult Children Patient Interpreter Needed:  None Criminal Activity/Legal Involvement Pertinent to Current Situation/Hospitalization:  No - Comment as needed Significant Relationships:  Adult Children Lives with:  Facility Resident Do you feel safe going back to the place where you live?  Yes Need for family participation in patient care:  Yes (Comment)  Care giving concerns:  Patients family agrees that the patient will need to return to SNF at discharge.   Social Worker assessment / plan:  CSW spoke [facility] (daughter) by phone to complete assessment. Patient's son at bedside. Patient's son states that the patient was admitted from [facility] where she has been a long term resident for 5 years. He states that the plan is for the patient to discharge back to the facility once medically stable. The son states that [facility] is the Management consultant. [facility] is in agreement with the patient returning to [facility] today. CSW will assist.   Employment status:  Disabled (Comment on whether or not currently receiving Disability) Insurance information:    PT Recommendations:  Skilled Nursing Facility Information / Referral to community resources:  Skilled Nursing Facility  Patient/Family's Response to care: Patient and family appear to be happy with the care the patient has received here at [facility].  Patient/Family's Understanding of and Emotional Response to Diagnosis, Current Treatment, and Prognosis:  Patient's family appears to have fair understanding of reason for admission and diagnosis. Family understands what the patient's post discharge needs will be.  Emotional Assessment Appearance:  Appears stated age Attitude/Demeanor/Rapport:  Other (Appropriate) Affect (typically observed):  Accepting, Appropriate Orientation:  Oriented to Self, Oriented to Place, Oriented to  Time, Oriented to Situation Alcohol / Substance use:  Tobacco Use (Hx of) Psych involvement (Current and /or in the community):  No (Comment)  Discharge Needs  Concerns to be addressed:  Discharge Planning Concerns Readmission within the last 30 days:  No Current discharge risk:  Chronically ill, Physical Impairment Barriers to Discharge:  Barriers Resolved   Venita Lick, LCSW 01/24/2015, 10:03 AM

## 2015-02-25 ENCOUNTER — Other Ambulatory Visit: Payer: Self-pay

## 2015-02-25 DIAGNOSIS — Z1231 Encounter for screening mammogram for malignant neoplasm of breast: Secondary | ICD-10-CM

## 2015-03-18 ENCOUNTER — Ambulatory Visit
Admission: RE | Admit: 2015-03-18 | Discharge: 2015-03-18 | Disposition: A | Discharge status: Home / Self Care | Payer: Medicare Other | Source: Ambulatory Visit

## 2015-03-18 DIAGNOSIS — Z1231 Encounter for screening mammogram for malignant neoplasm of breast: Secondary | ICD-10-CM

## 2015-03-22 ENCOUNTER — Other Ambulatory Visit: Payer: Self-pay | Admitting: Family Medicine

## 2015-03-22 DIAGNOSIS — R928 Other abnormal and inconclusive findings on diagnostic imaging of breast: Secondary | ICD-10-CM

## 2015-05-11 ENCOUNTER — Other Ambulatory Visit: Payer: Self-pay | Admitting: Internal Medicine

## 2015-05-11 DIAGNOSIS — R928 Other abnormal and inconclusive findings on diagnostic imaging of breast: Secondary | ICD-10-CM

## 2015-05-25 ENCOUNTER — Other Ambulatory Visit: Payer: Medicare Other

## 2015-06-15 ENCOUNTER — Encounter: Payer: Self-pay | Admitting: *Deleted

## 2015-06-15 DEATH — deceased
